# Patient Record
Sex: Female | Born: 1975 | Race: White | Hispanic: No | Marital: Married | Smoking: Former smoker
Health system: Southern US, Community
[De-identification: ages and names within clinical notes are randomized; demographics above are authoritative.]

## PROBLEM LIST (undated history)

## (undated) DIAGNOSIS — R350 Frequency of micturition: Secondary | ICD-10-CM

## (undated) DIAGNOSIS — Z87442 Personal history of urinary calculi: Secondary | ICD-10-CM

## (undated) DIAGNOSIS — C50919 Malignant neoplasm of unspecified site of unspecified female breast: Secondary | ICD-10-CM

## (undated) DIAGNOSIS — N393 Stress incontinence (female) (male): Secondary | ICD-10-CM

## (undated) DIAGNOSIS — Z923 Personal history of irradiation: Secondary | ICD-10-CM

## (undated) DIAGNOSIS — N368 Other specified disorders of urethra: Secondary | ICD-10-CM

## (undated) DIAGNOSIS — K429 Umbilical hernia without obstruction or gangrene: Secondary | ICD-10-CM

## (undated) DIAGNOSIS — I10 Essential (primary) hypertension: Secondary | ICD-10-CM

## (undated) DIAGNOSIS — T8859XA Other complications of anesthesia, initial encounter: Secondary | ICD-10-CM

## (undated) DIAGNOSIS — N6019 Diffuse cystic mastopathy of unspecified breast: Secondary | ICD-10-CM

## (undated) DIAGNOSIS — Z800 Family history of malignant neoplasm of digestive organs: Secondary | ICD-10-CM

## (undated) DIAGNOSIS — Z801 Family history of malignant neoplasm of trachea, bronchus and lung: Secondary | ICD-10-CM

## (undated) HISTORY — DX: Family history of malignant neoplasm of digestive organs: Z80.0

## (undated) HISTORY — DX: Family history of malignant neoplasm of trachea, bronchus and lung: Z80.1

## (undated) HISTORY — DX: Umbilical hernia without obstruction or gangrene: K42.9

## (undated) HISTORY — DX: Diffuse cystic mastopathy of unspecified breast: N60.19

## (undated) HISTORY — DX: Stress incontinence (female) (male): N39.3

---

## 2006-01-20 ENCOUNTER — Emergency Department: Payer: Self-pay | Admitting: Emergency Medicine

## 2006-01-20 ENCOUNTER — Other Ambulatory Visit: Payer: Self-pay

## 2006-02-14 DIAGNOSIS — F410 Panic disorder [episodic paroxysmal anxiety] without agoraphobia: Secondary | ICD-10-CM | POA: Insufficient documentation

## 2007-02-02 DIAGNOSIS — E7800 Pure hypercholesterolemia, unspecified: Secondary | ICD-10-CM | POA: Insufficient documentation

## 2008-06-26 ENCOUNTER — Observation Stay: Payer: Self-pay

## 2008-08-25 ENCOUNTER — Encounter: Payer: Self-pay | Admitting: Maternal & Fetal Medicine

## 2008-09-01 ENCOUNTER — Ambulatory Visit: Payer: Self-pay

## 2008-09-08 ENCOUNTER — Encounter: Payer: Self-pay | Admitting: Maternal and Fetal Medicine

## 2008-09-15 ENCOUNTER — Encounter: Payer: Self-pay | Admitting: Maternal & Fetal Medicine

## 2008-09-22 ENCOUNTER — Encounter: Payer: Self-pay | Admitting: Obstetrics & Gynecology

## 2008-09-25 ENCOUNTER — Observation Stay: Payer: Self-pay

## 2008-10-05 ENCOUNTER — Observation Stay: Payer: Self-pay

## 2008-10-09 ENCOUNTER — Encounter: Payer: Self-pay | Admitting: Maternal & Fetal Medicine

## 2008-10-20 ENCOUNTER — Observation Stay: Payer: Self-pay

## 2008-10-23 ENCOUNTER — Encounter: Payer: Self-pay | Admitting: Obstetrics & Gynecology

## 2008-10-24 ENCOUNTER — Inpatient Hospital Stay: Payer: Self-pay

## 2009-08-25 DIAGNOSIS — E559 Vitamin D deficiency, unspecified: Secondary | ICD-10-CM | POA: Insufficient documentation

## 2010-07-04 HISTORY — PX: FOOT SURGERY: SHX648

## 2011-04-26 ENCOUNTER — Emergency Department: Payer: Self-pay | Admitting: Emergency Medicine

## 2011-04-26 LAB — URINALYSIS, COMPLETE
Ketone: NEGATIVE
Ph: 7 (ref 4.5–8.0)
Protein: 100
RBC,UR: 96 /HPF (ref 0–5)
Specific Gravity: 1.008 (ref 1.003–1.030)

## 2011-04-26 LAB — PREGNANCY, URINE: Pregnancy Test, Urine: NEGATIVE m[IU]/mL

## 2011-12-01 ENCOUNTER — Ambulatory Visit: Payer: Self-pay | Admitting: Family Medicine

## 2012-11-22 LAB — HM PAP SMEAR: HM Pap smear: NORMAL

## 2013-08-08 LAB — HEPATIC FUNCTION PANEL
ALT: 7 U/L (ref 7–35)
AST: 12 U/L — AB (ref 13–35)
Alkaline Phosphatase: 47 U/L (ref 25–125)
BILIRUBIN, TOTAL: 1 mg/dL

## 2013-08-08 LAB — LIPID PANEL
Cholesterol: 166 mg/dL (ref 0–200)
HDL: 56 mg/dL (ref 35–70)
LDL Cholesterol: 88 mg/dL
Triglycerides: 112 mg/dL (ref 40–160)

## 2013-08-08 LAB — CBC AND DIFFERENTIAL
HEMATOCRIT: 42 % (ref 36–46)
Hemoglobin: 13.6 g/dL (ref 12.0–16.0)
Neutrophils Absolute: 63 /uL
Platelets: 401 10*3/uL — AB (ref 150–399)
WBC: 6.5 10*3/mL

## 2013-08-08 LAB — BASIC METABOLIC PANEL
BUN: 14 mg/dL (ref 4–21)
Creatinine: 0.8 mg/dL (ref 0.5–1.1)
GLUCOSE: 92 mg/dL
POTASSIUM: 4.9 mmol/L (ref 3.4–5.3)
Sodium: 140 mmol/L (ref 137–147)

## 2013-08-08 LAB — TSH: TSH: 1.21 u[IU]/mL (ref 0.41–5.90)

## 2015-02-03 ENCOUNTER — Encounter: Payer: Self-pay | Admitting: Family Medicine

## 2015-03-13 DIAGNOSIS — S86019A Strain of unspecified Achilles tendon, initial encounter: Secondary | ICD-10-CM | POA: Insufficient documentation

## 2015-03-13 DIAGNOSIS — R002 Palpitations: Secondary | ICD-10-CM | POA: Insufficient documentation

## 2015-03-13 DIAGNOSIS — Z87442 Personal history of urinary calculi: Secondary | ICD-10-CM | POA: Insufficient documentation

## 2015-03-20 ENCOUNTER — Ambulatory Visit (INDEPENDENT_AMBULATORY_CARE_PROVIDER_SITE_OTHER): Payer: PRIVATE HEALTH INSURANCE | Admitting: Family Medicine

## 2015-03-20 ENCOUNTER — Encounter: Payer: Self-pay | Admitting: Family Medicine

## 2015-03-20 VITALS — BP 102/68 | HR 72 | Temp 98.3°F | Resp 14 | Ht 68.25 in | Wt 139.6 lb

## 2015-03-20 DIAGNOSIS — F410 Panic disorder [episodic paroxysmal anxiety] without agoraphobia: Secondary | ICD-10-CM

## 2015-03-20 DIAGNOSIS — Z0000 Encounter for general adult medical examination without abnormal findings: Secondary | ICD-10-CM

## 2015-03-20 DIAGNOSIS — E7800 Pure hypercholesterolemia, unspecified: Secondary | ICD-10-CM

## 2015-03-20 LAB — POCT URINALYSIS DIPSTICK
BILIRUBIN UA: NEGATIVE
Glucose, UA: NEGATIVE
Ketones, UA: NEGATIVE
Leukocytes, UA: NEGATIVE
Nitrite, UA: NEGATIVE
PH UA: 7.5
Protein, UA: NEGATIVE
RBC UA: NEGATIVE
Spec Grav, UA: 1.01
UROBILINOGEN UA: 0.2

## 2015-03-20 NOTE — Progress Notes (Signed)
Patient ID: [name], female   DOB: Mar 27, 1976, 39 y.o.   MRN: [id number]       Patient: Megan Cummings, Female    DOB: March 11, 1976, 39 y.o.   MRN: [id number] Visit Date: 03/20/2015  Today's Provider: Vernie Murders, PA   Chief Complaint  Patient presents with  . Annual Exam   Subjective:    Annual physical exam Megan Cummings is a 39 y.o. female who presents today for health maintenance and complete physical. She feels well. She reports exercising 2 times a week. She reports she is sleeping well (average 7 hours per night).  Mammo: 12/31/2014 Tdap: 10/28/2010 Pap: 12/2014 at [facility] [place]:[id number] -----------------------------------------------------------------    Review of Systems  Constitutional: Negative.   HENT: Negative.   Eyes: Negative.   Respiratory: Negative.   Cardiovascular: Negative.   Gastrointestinal: Negative.   Endocrine: Negative.   Genitourinary: Negative.   Musculoskeletal: Positive for back pain.  Skin: Negative.   Allergic/Immunologic: Negative.   Neurological: Negative.   Hematological: Negative.   Psychiatric/Behavioral: Negative.     Social History      She  reports that she has quit smoking. Her smoking use included Cigarettes. She has a 5 pack-year smoking history. She has never used smokeless tobacco. She reports that she drinks alcohol. She reports that she does not use illicit drugs.       Social History   Social History  . Marital Status: Married    Spouse Name: N/A  . Number of Children: N/A  . Years of Education: N/A   Social History Main Topics  . Smoking status: Former Smoker -- 0.50 packs/day for 10 years    Types: Cigarettes  . Smokeless tobacco: Never Used  . Alcohol Use: 0.0 oz/week    0 Standard drinks or equivalent per week     Comment: Occasionally drinks wine  . Drug Use: No  . Sexual Activity: Not Asked   Other Topics Concern  . None   Social History Narrative    Patient Active Problem  List   Diagnosis Date Noted  . Personal history of urinary calculi 03/13/2015  . Awareness of heartbeats 03/13/2015  . Achilles rupture 03/13/2015  . Avitaminosis D 08/25/2009  . Pure hypercholesterolemia 02/02/2007  . Episodic paroxysmal anxiety disorder 02/14/2006   Past Surgical History  Procedure Laterality Date  . Foot surgery Right 07/2010   Family History        Family Status  Relation Status Death Age  . Mother Alive   . Father Alive   . Brother Alive   . Son Alive   . Maternal Grandmother Alive   . Maternal Grandfather Deceased     suicide  . Paternal Grandfather Deceased         Her family history includes Colon cancer in her paternal grandmother; Emphysema in her maternal grandfather; Healthy in her brother, maternal grandmother, mother, and son; Hyperlipidemia in her father; Lung cancer in her paternal grandfather.    Allergies  Allergen Reactions  . Sulfa Antibiotics     Previous Medications   LORAZEPAM (ATIVAN) 1 MG TABLET    Take by mouth.    Patient Care Team: Margo Common, PA as PCP - General (Family Medicine)     Objective:   Vitals: BP 102/68 mmHg  Pulse 72  Temp(Src) 98.3 F (36.8 C) (Oral)  Resp 14  Ht 5' 8.25" (1.734 m)  Wt 139 lb 9.6 oz (63.322 kg)  BMI 21.06 kg/m2  SpO2  98%  LMP 03/06/2015   Physical Exam  Constitutional: She is oriented to person, place, and time. She appears well-developed and well-nourished.  HENT:  Head: Normocephalic and atraumatic.  Right Ear: External ear normal.  Left Ear: External ear normal.  Nose: Nose normal.  Mouth/Throat: Oropharynx is clear and moist.  Eyes: Conjunctivae and EOM are normal. Pupils are equal, round, and reactive to light. Right eye exhibits no discharge.  Neck: Normal range of motion. Neck supple. No tracheal deviation present. No thyromegaly present.  Cardiovascular: Normal rate, regular rhythm, normal heart sounds and intact distal pulses.   No murmur heard. Pulmonary/Chest:  Effort normal and breath sounds normal. No respiratory distress. She has no wheezes. She has no rales. She exhibits no tenderness.  Abdominal: Soft. She exhibits no distension and no mass. There is no tenderness. There is no rebound and no guarding.  Genitourinary:  Accomplished by Mosetta Pigeon in September 2016 with mammograms and PAP.  Musculoskeletal: Normal range of motion. She exhibits no edema or tenderness.  Lymphadenopathy:    She has no cervical adenopathy.  Neurological: She is alert and oriented to person, place, and time. She has normal reflexes. No cranial nerve deficit. She exhibits normal muscle tone. Coordination normal.  Skin: Skin is warm and dry. No rash noted. No erythema.  Psychiatric: She has a normal mood and affect. Her behavior is normal. Judgment and thought content normal.   Depression Screen Rare anxiety/panic attacks. Uses Ativan 4-5 times a year. No suicidal ideation or depression.   Assessment & Plan:     Routine Health Maintenance and Physical Exam  Exercise Activities and Dietary recommendations Goals    Continue routine yoga and exercise program twice a week with well balanced diet.      Immunization History  Administered Date(s) Administered  . Tdap 10/28/2010    Health Maintenance  Topic Date Due  . HIV Screening  02/07/1989  . INFLUENZA VACCINE  07/03/2015 (Originally 11/03/2014)  . PAP SMEAR  12/30/2017  . TETANUS/TDAP  10/27/2020      Discussed health benefits of physical activity, and encouraged her to engage in regular exercise appropriate for her age and condition.    --------------------------------------------------------------------  1. Annual physical exam Good general health. Continue routine exercise program. Refuses flu shot. Remainder of immunizations up to date. Continues to get PAP and mammograms by her GYN ([facility]) annually. Recheck routine labs and follow up prn. - POCT urinalysis dipstick - CBC with  Differential/Platelet - COMPLETE METABOLIC PANEL WITH GFR - HIV antibody (with reflex)  2. Episodic paroxysmal anxiety disorder Rare anxiety attacks with palpitations. Well controlled by Ativan prn. Has taken 4-5 tablets in the past year. Recheck prn. - TSH  3. Pure hypercholesterolemia Trying to control diet and exercise regularly. Will recheck labs. - Lipid panel

## 2015-03-20 NOTE — Patient Instructions (Signed)

## 2015-09-21 DIAGNOSIS — C4491 Basal cell carcinoma of skin, unspecified: Secondary | ICD-10-CM

## 2015-09-21 HISTORY — DX: Basal cell carcinoma of skin, unspecified: C44.91

## 2015-10-02 ENCOUNTER — Encounter: Payer: Self-pay | Admitting: Family Medicine

## 2015-10-02 ENCOUNTER — Ambulatory Visit (INDEPENDENT_AMBULATORY_CARE_PROVIDER_SITE_OTHER): Payer: Managed Care, Other (non HMO) | Admitting: Family Medicine

## 2015-10-02 VITALS — BP 118/72 | Temp 99.2°F | Resp 16 | Ht 69.0 in | Wt 145.0 lb

## 2015-10-02 DIAGNOSIS — K429 Umbilical hernia without obstruction or gangrene: Secondary | ICD-10-CM

## 2015-10-02 DIAGNOSIS — R202 Paresthesia of skin: Secondary | ICD-10-CM | POA: Diagnosis not present

## 2015-10-02 NOTE — Progress Notes (Signed)
Patient: Megan Cummings Female    DOB: 1976-03-31   40 y.o.   MRN: [id number] Visit Date: 10/02/2015  Today's Provider: Vernie Murders, PA   Chief Complaint  Patient presents with  . Abdominal Pain    X 4 days.    Subjective:    Abdominal Pain This is a new problem. The current episode started in the past 7 days. The problem occurs constantly. The pain is mild. The quality of the pain is dull. The abdominal pain radiates to the periumbilical region. Pertinent negatives include no constipation, diarrhea, dysuria, nausea or vomiting. The pain is aggravated by certain positions. She has tried acetaminophen for the symptoms. The treatment provided no relief.   No past medical history on file. Patient Active Problem List   Diagnosis Date Noted  . Personal history of urinary calculi 03/13/2015  . Awareness of heartbeats 03/13/2015  . Achilles rupture 03/13/2015  . Avitaminosis D 08/25/2009  . Pure hypercholesterolemia 02/02/2007  . Episodic paroxysmal anxiety disorder 02/14/2006   Past Surgical History  Procedure Laterality Date  . Foot surgery Right 07/2010   Family History  Problem Relation Age of Onset  . Healthy Mother   . Hyperlipidemia Father   . Healthy Brother   . Healthy Son   . Healthy Maternal Grandmother   . Emphysema Maternal Grandfather   . Colon cancer Paternal Grandmother   . Lung cancer Paternal Grandfather     Allergies  Allergen Reactions  . Sulfa Antibiotics    Current Meds  Medication Sig  . LORazepam (ATIVAN) 1 MG tablet Take by mouth.    Review of Systems  Constitutional: Negative.   Gastrointestinal: Positive for abdominal pain. Negative for nausea, vomiting, diarrhea, constipation and blood in stool.  Genitourinary: Negative for dysuria.    Social History  Substance Use Topics  . Smoking status: Former Smoker -- 0.50 packs/day for 10 years    Types: Cigarettes  . Smokeless tobacco: Never Used  . Alcohol Use: 0.0 oz/week    0  Standard drinks or equivalent per week     Comment: Occasionally drinks wine   Objective:   BP 118/72 mmHg  Temp(Src) 99.2 F (37.3 C)  Resp 16  Ht 5\' 9"  (1.753 m)  Wt 145 lb (65.772 kg)  BMI 21.40 kg/m2  LMP 09/07/2015 (Approximate)  Physical Exam  Constitutional: She is oriented to person, place, and time. She appears well-developed and well-nourished. No distress.  HENT:  Head: Normocephalic and atraumatic.  Right Ear: Hearing normal.  Left Ear: Hearing normal.  Nose: Nose normal.  Eyes: Conjunctivae and lids are normal. Right eye exhibits no discharge. Left eye exhibits no discharge. No scleral icterus.  Neck: Neck supple.  Cardiovascular: Normal rate and regular rhythm.   Pulmonary/Chest: Effort normal and breath sounds normal. No respiratory distress.  Abdominal: Soft. Bowel sounds are normal. There is no tenderness.  1 cm opening in abdominal muscles behind umbilicus. No tenderness or protruding hernia.  Musculoskeletal: Normal range of motion.  Neurological: She is alert and oriented to person, place, and time.  Good bilateral grip strength and arm strength. Decrease in sensation of radial side of left thumb up the forearm along the radius to the medial upper arm to test with a nylon string (approximate 3 cm path). Negative Phalen and Tinel signs. Symmetric pulses.  Skin: Skin is intact. No lesion and no rash noted.  Psychiatric: She has a normal mood and affect. Her speech is normal and  behavior is normal. Thought content normal.      Assessment & Plan:     1. Umbilical hernia without obstruction and without gangrene Recent onset with intermittent sharp pain with certain movements or exercise. No swelling or signs of incarceration. Suspect strain/stretch of umbilical defect. Limit lifting and straining. Use Ibuprofen 600 mg TID with meals and recheck if no better or worsening in the next 2 weeks. May need referral to surgeon.  2. Paresthesia of left arm Onset over the  past 5-7 days without known injury or chronic repetitive motion activities. No muscle weakness or neck pain. If no better with rest and use of NSAID over the next 2 weeks, will need referral to a neurologist for NCV studies.       Vernie Murders, PA  [facility]

## 2015-10-02 NOTE — Patient Instructions (Signed)
Hernia, Adult A hernia is the bulging of an organ or tissue through a weak spot in the muscles of the abdomen (abdominal wall). Hernias develop most often near the navel or groin. There are many kinds of hernias. Common kinds include:  Femoral hernia. This kind of hernia develops under the groin in the upper thigh area.  Inguinal hernia. This kind of hernia develops in the groin or scrotum.  Umbilical hernia. This kind of hernia develops near the navel.  Hiatal hernia. This kind of hernia causes part of the stomach to be pushed up into the chest.  Incisional hernia. This kind of hernia bulges through a scar from an abdominal surgery. CAUSES This condition may be caused by:  Heavy lifting.  Coughing over a long period of time.  Straining to have a bowel movement.  An incision made during an abdominal surgery.  A birth defect (congenital defect).  Excess weight or obesity.  Smoking.  Poor nutrition.  Cystic fibrosis.  Excess fluid in the abdomen.  Undescended testicles. SYMPTOMS Symptoms of a hernia include:  A lump on the abdomen. This is the first sign of a hernia. The lump may become more obvious with standing, straining, or coughing. It may get bigger over time if it is not treated or if the condition causing it is not treated.  Pain. A hernia is usually painless, but it may become painful over time if treatment is delayed. The pain is usually dull and may get worse with standing or lifting heavy objects. Sometimes a hernia gets tightly squeezed in the weak spot (strangulated) or stuck there (incarcerated) and causes additional symptoms. These symptoms may include:  Vomiting.  Nausea.  Constipation.  Irritability. DIAGNOSIS A hernia may be diagnosed with:  A physical exam. During the exam your health care provider may ask you to cough or to make a specific movement, because a hernia is usually more visible when you move.  Imaging tests. These can  include:  X-rays.  Ultrasound.  CT scan. TREATMENT A hernia that is small and painless may not need to be treated. A hernia that is large or painful may be treated with surgery. Inguinal hernias may be treated with surgery to prevent incarceration or strangulation. Strangulated hernias are always treated with surgery, because lack of blood to the trapped organ or tissue can cause it to die. Surgery to treat a hernia involves pushing the bulge back into place and repairing the weak part of the abdomen. HOME CARE INSTRUCTIONS  Avoid straining.  Do not lift anything heavier than 10 lb (4.5 kg).  Lift with your leg muscles, not your back muscles. This helps avoid strain.  When coughing, try to cough gently.  Prevent constipation. Constipation leads to straining with bowel movements, which can make a hernia worse or cause a hernia repair to break down. You can prevent constipation by:  Eating a high-fiber diet that includes plenty of fruits and vegetables.  Drinking enough fluids to keep your urine clear or pale yellow. Aim to drink 6-8 glasses of water per day.  Using a stool softener as directed by your health care provider.  Lose weight, if you are overweight.  Do not use any tobacco products, including cigarettes, chewing tobacco, or electronic cigarettes. If you need help quitting, ask your health care provider.  Keep all follow-up visits as directed by your health care provider. This is important. Your health care provider may need to monitor your condition. SEEK MEDICAL CARE IF:  You have   swelling, redness, and pain in the affected area.  Your bowel habits change. SEEK IMMEDIATE MEDICAL CARE IF:  You have a fever.  You have abdominal pain that is getting worse.  You feel nauseous or you vomit.  You cannot push the hernia back in place by gently pressing on it while you are lying down.  The hernia:  Changes in shape or size.  Is stuck outside the  abdomen.  Becomes discolored.  Feels hard or tender.   This information is not intended to replace advice given to you by your health care provider. Make sure you discuss any questions you have with your health care provider.   Document Released: 03/21/2005 Document Revised: 04/11/2014 Document Reviewed: 01/29/2014 Elsevier Interactive Patient Education 2016 Elsevier Inc[employer]  

## 2015-10-03 DIAGNOSIS — K429 Umbilical hernia without obstruction or gangrene: Secondary | ICD-10-CM

## 2015-10-03 HISTORY — DX: Umbilical hernia without obstruction or gangrene: K42.9

## 2015-10-12 ENCOUNTER — Encounter: Payer: Self-pay | Admitting: Family Medicine

## 2015-10-12 ENCOUNTER — Encounter: Payer: Self-pay | Admitting: *Deleted

## 2015-10-12 ENCOUNTER — Ambulatory Visit (INDEPENDENT_AMBULATORY_CARE_PROVIDER_SITE_OTHER): Payer: Managed Care, Other (non HMO) | Admitting: Family Medicine

## 2015-10-12 VITALS — BP 124/68 | HR 58 | Temp 98.6°F | Resp 16 | Ht 69.0 in | Wt 145.0 lb

## 2015-10-12 DIAGNOSIS — R1033 Periumbilical pain: Secondary | ICD-10-CM

## 2015-10-12 NOTE — Progress Notes (Signed)
Patient: Megan Cummings Female    DOB: [birth year]   40 y.o.   MRN: [id number] Visit Date: 10/12/2015  Today's Provider: Vernie Murders, PA   Chief Complaint  Patient presents with  . Abdominal Pain   Subjective:    HPI Patient comes in today c/o abdominal pain that is located around her periumbilical region. Patient was seen in the office on 10/02/2015 with similar symptoms. Patient reports that during the exam, she could not feel the pain when her stomach was pressed in a downward motion. Patient reports that she experiences the pain when her stomach is pressed in an upward motion. Patient is not sure if this has any significance, but wants to be checked out to be sure. She has been taking Ibuprofen with no relief.   No past medical history on file. Patient Active Problem List   Diagnosis Date Noted  . Personal history of urinary calculi 03/13/2015  . Awareness of heartbeats 03/13/2015  . Achilles rupture 03/13/2015  . Avitaminosis D 08/25/2009  . Pure hypercholesterolemia 02/02/2007  . Episodic paroxysmal anxiety disorder 02/14/2006   Past Surgical History  Procedure Laterality Date  . Foot surgery Right 07/2010   Family History  Problem Relation Age of Onset  . Healthy Mother   . Hyperlipidemia Father   . Healthy Brother   . Healthy Son   . Healthy Maternal Grandmother   . Emphysema Maternal Grandfather   . Colon cancer Paternal Grandmother   . Lung cancer Paternal Grandfather    Allergies  Allergen Reactions  . Sulfa Antibiotics    Current Meds  Medication Sig  . LORazepam (ATIVAN) 1 MG tablet Take by mouth.    Review of Systems  Constitutional: Negative.   Gastrointestinal: Positive for abdominal pain. Negative for nausea, vomiting, diarrhea, constipation and abdominal distention.  Genitourinary: Negative.   Neurological: Negative.     Social History  Substance Use Topics  . Smoking status: Former Smoker -- 0.50 packs/day for 10 years   Types: Cigarettes  . Smokeless tobacco: Never Used  . Alcohol Use: 0.0 oz/week    0 Standard drinks or equivalent per week     Comment: Occasionally drinks wine   Objective:   BP 124/68 mmHg  Pulse 58  Temp(Src) 98.6 F (37 C)  Resp 16  Ht 5\' 9"  (1.753 m)  Wt 145 lb (65.772 kg)  BMI 21.40 kg/m2  LMP 10/08/2015  Physical Exam  Constitutional: She is oriented to person, place, and time. She appears well-developed and well-nourished. No distress.  HENT:  Head: Normocephalic and atraumatic.  Right Ear: Hearing normal.  Left Ear: Hearing normal.  Nose: Nose normal.  Eyes: Conjunctivae and lids are normal. Right eye exhibits no discharge. Left eye exhibits no discharge. No scleral icterus.  Pulmonary/Chest: Effort normal. No respiratory distress.  Abdominal: Soft. Bowel sounds are normal. She exhibits no distension and no mass. There is tenderness. There is no rebound and no guarding.  Pain 2-3 cm left of the umbilicus with a lifting palpation/pushing upward.  Musculoskeletal: Normal range of motion.  Neurological: She is alert and oriented to person, place, and time.  Skin: Skin is intact. No lesion and no rash noted.  Psychiatric: She has a normal mood and affect. Her speech is normal and behavior is normal. Thought content normal.      Assessment & Plan:     1. Periumbilical pain Some worsening of periumbilical pain to the left of the umbilicus. No masses  felt. Slight opening behind umbilicus palpable. Will schedule surgical evaluation. May be due to return to strenuous exercise program. - Ambulatory referral to [place] [facility]

## 2015-10-27 ENCOUNTER — Encounter: Payer: Self-pay | Admitting: General Surgery

## 2015-10-27 ENCOUNTER — Ambulatory Visit (INDEPENDENT_AMBULATORY_CARE_PROVIDER_SITE_OTHER): Payer: Managed Care, Other (non HMO) | Admitting: General Surgery

## 2015-10-27 VITALS — BP 128/70 | HR 72 | Resp 12 | Ht 69.0 in | Wt 143.0 lb

## 2015-10-27 DIAGNOSIS — K429 Umbilical hernia without obstruction or gangrene: Secondary | ICD-10-CM | POA: Insufficient documentation

## 2015-10-27 NOTE — Patient Instructions (Addendum)
Hernia, Adult A hernia is the bulging of an organ or tissue through a weak spot in the muscles of the abdomen (abdominal wall). Hernias develop most often near the navel or groin. There are many kinds of hernias. Common kinds include:  Femoral hernia. This kind of hernia develops under the groin in the upper thigh area.  Inguinal hernia. This kind of hernia develops in the groin or scrotum.  Umbilical hernia. This kind of hernia develops near the navel.  Hiatal hernia. This kind of hernia causes part of the stomach to be pushed up into the chest.  Incisional hernia. This kind of hernia bulges through a scar from an abdominal surgery. CAUSES This condition may be caused by:  Heavy lifting.  Coughing over a long period of time.  Straining to have a bowel movement.  An incision made during an abdominal surgery.  A birth defect (congenital defect).  Excess weight or obesity.  Smoking.  Poor nutrition.  Cystic fibrosis.  Excess fluid in the abdomen.  Undescended testicles. SYMPTOMS Symptoms of a hernia include:  A lump on the abdomen. This is the first sign of a hernia. The lump may become more obvious with standing, straining, or coughing. It may get bigger over time if it is not treated or if the condition causing it is not treated.  Pain. A hernia is usually painless, but it may become painful over time if treatment is delayed. The pain is usually dull and may get worse with standing or lifting heavy objects. Sometimes a hernia gets tightly squeezed in the weak spot (strangulated) or stuck there (incarcerated) and causes additional symptoms. These symptoms may include:  Vomiting.  Nausea.  Constipation.  Irritability. DIAGNOSIS A hernia may be diagnosed with:  A physical exam. During the exam your health care provider may ask you to cough or to make a specific movement, because a hernia is usually more visible when you move.  Imaging tests. These can  include:  X-rays.  Ultrasound.  CT scan. TREATMENT A hernia that is small and painless may not need to be treated. A hernia that is large or painful may be treated with surgery. Inguinal hernias may be treated with surgery to prevent incarceration or strangulation. Strangulated hernias are always treated with surgery, because lack of blood to the trapped organ or tissue can cause it to die. Surgery to treat a hernia involves pushing the bulge back into place and repairing the weak part of the abdomen. HOME CARE INSTRUCTIONS  Avoid straining.  Do not lift anything heavier than 10 lb (4.5 kg).  Lift with your leg muscles, not your back muscles. This helps avoid strain.  When coughing, try to cough gently.  Prevent constipation. Constipation leads to straining with bowel movements, which can make a hernia worse or cause a hernia repair to break down. You can prevent constipation by:  Eating a high-fiber diet that includes plenty of fruits and vegetables.  Drinking enough fluids to keep your urine clear or pale yellow. Aim to drink 6-8 glasses of water per day.  Using a stool softener as directed by your health care provider.  Lose weight, if you are overweight.  Do not use any tobacco products, including cigarettes, chewing tobacco, or electronic cigarettes. If you need help quitting, ask your health care provider.  Keep all follow-up visits as directed by your health care provider. This is important. Your health care provider may need to monitor your condition. SEEK MEDICAL CARE IF:  You have   swelling, redness, and pain in the affected area.  Your bowel habits change. SEEK IMMEDIATE MEDICAL CARE IF:  You have a fever.  You have abdominal pain that is getting worse.  You feel nauseous or you vomit.  You cannot push the hernia back in place by gently pressing on it while you are lying down.  The hernia:  Changes in shape or size.  Is stuck outside the  abdomen.  Becomes discolored.  Feels hard or tender.   This information is not intended to replace advice given to you by your health care provider. Make sure you discuss any questions you have with your health care provider.   Document Released: 03/21/2005 Document Revised: 04/11/2014 Document Reviewed: 01/29/2014 Elsevier Interactive Patient Education [employer].     The patient is aware to use an antiinflammatory of choice (Advil or Aleve) as needed for comfort.The patient is aware to use a heating pad.  Patient to return as needed.

## 2015-10-27 NOTE — Progress Notes (Signed)
Patient ID: Megan Cummings, female   DOB: 1975/05/21, 40 y.o.   MRN: [id number]  Chief Complaint  Patient presents with  . Umbilical Hernia    HPI [name] is a 40 y.o. female here today for a evaluation of a umbilical hernia. Patient states she noticed this about 3 weeks ago. The pain is mild with touch and feels like a dull ache.. The abdominal pain radiates to the periumbilical region.No  constipation, diarrhea, dysuria, nausea or vomiting. Patient has been working out .This exercise regimen is fairly vigorous including the use of free weights.  I personally reviewed the patient's history.        HPI  Past Medical History:  Diagnosis Date  . Umbilical hernia 0000000    Past Surgical History:  Procedure Laterality Date  . FOOT SURGERY Right 07/2010    Family History  Problem Relation Age of Onset  . Healthy Mother   . Hyperlipidemia Father   . Healthy Brother   . Healthy Son   . Healthy Maternal Grandmother   . Emphysema Maternal Grandfather   . Lung cancer Paternal Grandfather   . Colon cancer Paternal Grandmother     Social History Social History  Substance Use Topics  . Smoking status: Former Smoker    Packs/day: 0.50    Years: 10.00    Types: Cigarettes  . Smokeless tobacco: Never Used  . Alcohol use 0.0 oz/week     Comment: Occasionally drinks wine    Allergies  Allergen Reactions  . Sulfa Antibiotics     Current Outpatient Prescriptions  Medication Sig Dispense Refill  . LORazepam (ATIVAN) 1 MG tablet Take by mouth.     No current facility-administered medications for this visit.     Review of Systems Review of Systems  Constitutional: Negative.   Respiratory: Negative.   Cardiovascular: Negative.     Blood pressure 128/70, pulse 72, resp. rate 12, height 5\' 9"  (1.753 m), weight 143 lb (64.9 kg), last menstrual period 10/08/2015.  Physical Exam Physical Exam  Constitutional: She is oriented to person, place, and time. She  appears well-developed and well-nourished.  Eyes: Conjunctivae are normal. No scleral icterus.  Neck: Neck supple.  Cardiovascular: Normal rate, regular rhythm and normal heart sounds.   Pulmonary/Chest: Effort normal and breath sounds normal.  Abdominal: Soft. Normal appearance and bowel sounds are normal. There is tenderness (2 cm from the umbilicus). A hernia ( 10 mm defect at umbilical area. ) is present.    Lymphadenopathy:    She has no cervical adenopathy.    She has no axillary adenopathy.  Neurological: She is alert and oriented to person, place, and time.  Skin: Skin is warm and dry.       Assessment    Umbilical hernia, asymptomatic.  Left rectus muscle strain.    Plan        The patient is aware to use an antiinflammatory of choice (Advil or Aleve) as needed for comfort.The patient is aware to use a heating pad.  Patient to return as needed.  This information has been scribed by Gaspar Cola CMA.  PCP:  PA Vernie Murders  Robert Bellow 10/27/2015, 4:36 PM

## 2016-10-17 ENCOUNTER — Ambulatory Visit (INDEPENDENT_AMBULATORY_CARE_PROVIDER_SITE_OTHER): Payer: BLUE CROSS/BLUE SHIELD | Admitting: Family Medicine

## 2016-10-17 ENCOUNTER — Encounter: Payer: Self-pay | Admitting: Family Medicine

## 2016-10-17 VITALS — BP 108/76 | HR 72 | Temp 98.1°F | Wt 147.6 lb

## 2016-10-17 DIAGNOSIS — F419 Anxiety disorder, unspecified: Secondary | ICD-10-CM | POA: Diagnosis not present

## 2016-10-17 DIAGNOSIS — L719 Rosacea, unspecified: Secondary | ICD-10-CM

## 2016-10-17 MED ORDER — BENZOYL PEROXIDE-ERYTHROMYCIN 5-3 % EX GEL: Freq: Two times a day (BID) | CUTANEOUS | 0 refills | Status: DC

## 2016-10-17 MED ORDER — LORAZEPAM 1 MG PO TABS: 1.0000 mg | ORAL_TABLET | Freq: Three times a day (TID) | ORAL | 0 refills | Status: DC | PRN

## 2016-10-17 NOTE — Patient Instructions (Signed)
Rosacea Rosacea is a long-term (chronic) condition that affects the skin of the face, including the cheeks, nose, brow, and chin. This condition can also affect the eyes. Rosacea causes blood vessels near the surface of the skin to enlarge, which results in redness. What are the causes? The cause of this condition is not known. Certain triggers can make rosacea worse, including:  Hot baths.  Exercise.  Sunlight.  Very hot or cold temperatures.  Hot or spicy foods and drinks.  Drinking alcohol.  Stress.  Taking blood pressure medicine.  Long-term use of topical steroids on the face.  What increases the risk? This condition is more likely to develop in:  People who are older than 43 years of age.  Women.  People who have light-colored skin (light complexion).  People who have a family history of rosacea.  What are the signs or symptoms? Symptoms of this condition include:  Redness of the face.  Red bumps or pimples on the face.  A red, enlarged nose.  Blushing easily.  Red lines on the skin.  Irritated or burning feeling in the eyes.  Swollen eyelids.  How is this diagnosed? This condition is diagnosed with a medical history and physical exam. How is this treated? There is no cure for this condition, but treatment can help to control your symptoms. Your health care provider may recommend that you see a skin specialist (dermatologist). Treatment may include:  Antibiotic medicines that are applied to the skin or taken as a pill.  Laser treatment to improve the appearance of the skin.  Surgery. This is rare.  Your health care provider will also recommend the best way to take care of your skin. Even after your skin improves, you will likely need to continue treatment to prevent your rosacea from coming back. Follow these instructions at home: Skin Care Take care of your skin as told by your health care provider. You may be told to do these things:  Wash  your skin gently two or more times each day.  Use mild soap.  Use a sunscreen or sunblock with SPF 30 or greater.  Use gentle cosmetics that are meant for sensitive skin.  Shave with an electric shaver instead of a blade.  Lifestyle  Try to keep track of what foods trigger this condition. Avoid any triggers. These may include: ? Spicy foods. ? Seafood. ? Cheese. ? Hot liquids. ? Nuts. ? Chocolate. ? Iodized salt.  Do not drink alcohol.  Avoid extremely cold or hot temperatures.  Try to reduce your stress. If you need help, talk with your health care provider.  When you exercise, do these things to stay cool: ? Limit your sun exposure. ? Use a fan. ? Do shorter and more frequent intervals of exercise. General instructions  Keep all follow-up visits as told by your health care provider. This is important.  Take over-the-counter and prescription medicines only as told by your health care provider.  If your eyelids are affected, apply warm compresses to them. Do this as told by your health care provider.  If you were prescribed an antibiotic medicine, apply or take it as told by your health care provider. Do not stop using the antibiotic even if your condition improves. Contact a health care provider if:  Your symptoms get worse.  Your symptoms do not improve after two months of treatment.  You have new symptoms.  You have any changes in vision or you have problems with your eyes, such as   redness or itching.  You feel depressed.  You lose your appetite.  You have trouble concentrating. This information is not intended to replace advice given to you by your health care provider. Make sure you discuss any questions you have with your health care provider. Document Released: 04/28/2004 Document Revised: 08/27/2015 Document Reviewed: 05/28/2014 Elsevier Interactive Patient Education  2018 Elsevier Inc.  

## 2016-10-17 NOTE — Progress Notes (Signed)
Patient: Megan Cummings Female    DOB: 19-Sep-1975   41 y.o.   MRN: [id number] Visit Date: 10/17/2016  Today's Provider: Vernie Murders, PA   Chief Complaint  Patient presents with  . Facial Swelling   Subjective:    HPI Facial Swelling & Redness This is a recurrent problem. The current episode started yesterday. The problem occurs constantly. The problem has been gradually worsening. Associated symptoms comments: Facial swelling, redness and itching  . Nothing aggravates the symptoms. She has tried nothing for the symptoms. She states she can normally use antibacterial soap and it clears up.    Past Medical History:  Diagnosis Date  . Umbilical hernia 01/2584   Patient Active Problem List   Diagnosis Date Noted  . Umbilical hernia 27/78/2423  . Personal history of urinary calculi 03/13/2015  . Awareness of heartbeats 03/13/2015  . Achilles rupture 03/13/2015  . Avitaminosis D 08/25/2009  . Pure hypercholesterolemia 02/02/2007  . Episodic paroxysmal anxiety disorder 02/14/2006   Past Surgical History:  Procedure Laterality Date  . FOOT SURGERY Right 07/2010   Family History  Problem Relation Age of Onset  . Healthy Mother   . Hyperlipidemia Father   . Healthy Brother   . Healthy Son   . Healthy Maternal Grandmother   . Emphysema Maternal Grandfather   . Lung cancer Paternal Grandfather   . Colon cancer Paternal Grandmother    Allergies  Allergen Reactions  . Sulfa Antibiotics    Previous Medications   LORAZEPAM (ATIVAN) 1 MG TABLET    Take by mouth.    Review of Systems  Constitutional: Negative.   Respiratory: Negative.   Cardiovascular: Negative.   Skin:       Facial swelling and redness     Social History  Substance Use Topics  . Smoking status: Former Smoker    Packs/day: 0.50    Years: 10.00    Types: Cigarettes  . Smokeless tobacco: Never Used  . Alcohol use 0.0 oz/week     Comment: Occasionally drinks wine   Objective:   BP 108/76 (BP  Location: Right Arm, Patient Position: Sitting, Cuff Size: Normal)   Pulse 72   Temp 98.1 F (36.7 C) (Oral)   Wt 147 lb 9.6 oz (67 kg)   SpO2 99%   BMI 21.80 kg/m   Physical Exam  Constitutional: She is oriented to person, place, and time. She appears well-developed and well-nourished. No distress.  HENT:  Head: Normocephalic and atraumatic.  Right Ear: Hearing normal.  Left Ear: Hearing normal.  Nose: Nose normal.  Eyes: Conjunctivae and lids are normal. Right eye exhibits no discharge. Left eye exhibits no discharge. No scleral icterus.  Pulmonary/Chest: Effort normal. No respiratory distress.  Musculoskeletal: Normal range of motion.  Neurological: She is alert and oriented to person, place, and time.  Skin: Skin is intact. Rash noted. No lesion noted.  Fine papules with pimples along chin.  Psychiatric: She has a normal mood and affect. Her speech is normal and behavior is normal. Thought content normal.     Assessment & Plan:     1. Rosacea, acne Intermittent flares of redness and pimple formation. Will treat with Benzmycin gel with the recent flare and encouraged to use antibacterial soaps and sunscreens. Recheck prn. May need referral to dermatologist if no responding well. - benzoyl peroxide-erythromycin (BENZAMYCIN) gel; Apply topically 2 (two) times daily.  Dispense: 23.3 g; Refill: 0  2. Anxiety Planning a trip to [place] in a few weeks  and need refill of Ativan for the trip. Some flying phobia. - LORazepam (ATIVAN) 1 MG tablet; Take 1 tablet (1 mg total) by mouth every 8 (eight) hours as needed for anxiety.  Dispense: 30 tablet; Refill: 0

## 2016-11-08 DIAGNOSIS — E782 Mixed hyperlipidemia: Secondary | ICD-10-CM | POA: Diagnosis not present

## 2016-11-08 DIAGNOSIS — S61209D Unspecified open wound of unspecified finger without damage to nail, subsequent encounter: Secondary | ICD-10-CM | POA: Diagnosis not present

## 2016-12-06 DIAGNOSIS — Z9889 Other specified postprocedural states: Secondary | ICD-10-CM

## 2016-12-06 DIAGNOSIS — Z85828 Personal history of other malignant neoplasm of skin: Secondary | ICD-10-CM | POA: Diagnosis not present

## 2016-12-06 DIAGNOSIS — D229 Melanocytic nevi, unspecified: Secondary | ICD-10-CM | POA: Diagnosis not present

## 2016-12-06 DIAGNOSIS — D485 Neoplasm of uncertain behavior of skin: Secondary | ICD-10-CM | POA: Diagnosis not present

## 2016-12-06 DIAGNOSIS — L814 Other melanin hyperpigmentation: Secondary | ICD-10-CM | POA: Diagnosis not present

## 2016-12-06 DIAGNOSIS — C44612 Basal cell carcinoma of skin of right upper limb, including shoulder: Secondary | ICD-10-CM | POA: Diagnosis not present

## 2016-12-06 DIAGNOSIS — D225 Melanocytic nevi of trunk: Secondary | ICD-10-CM | POA: Diagnosis not present

## 2016-12-06 DIAGNOSIS — Z1283 Encounter for screening for malignant neoplasm of skin: Secondary | ICD-10-CM | POA: Diagnosis not present

## 2016-12-06 DIAGNOSIS — D239 Other benign neoplasm of skin, unspecified: Secondary | ICD-10-CM

## 2016-12-06 HISTORY — DX: Other specified postprocedural states: Z98.890

## 2016-12-06 HISTORY — DX: Other benign neoplasm of skin, unspecified: D23.9

## 2017-02-13 DIAGNOSIS — D485 Neoplasm of uncertain behavior of skin: Secondary | ICD-10-CM | POA: Diagnosis not present

## 2017-02-13 DIAGNOSIS — L089 Local infection of the skin and subcutaneous tissue, unspecified: Secondary | ICD-10-CM | POA: Diagnosis not present

## 2017-02-20 DIAGNOSIS — C44612 Basal cell carcinoma of skin of right upper limb, including shoulder: Secondary | ICD-10-CM | POA: Diagnosis not present

## 2017-02-20 DIAGNOSIS — D485 Neoplasm of uncertain behavior of skin: Secondary | ICD-10-CM | POA: Diagnosis not present

## 2017-02-20 DIAGNOSIS — D229 Melanocytic nevi, unspecified: Secondary | ICD-10-CM | POA: Diagnosis not present

## 2017-06-29 ENCOUNTER — Encounter: Payer: Self-pay | Admitting: Obstetrics and Gynecology

## 2017-06-29 ENCOUNTER — Ambulatory Visit (INDEPENDENT_AMBULATORY_CARE_PROVIDER_SITE_OTHER): Payer: BLUE CROSS/BLUE SHIELD | Admitting: Obstetrics and Gynecology

## 2017-06-29 VITALS — BP 120/70 | Ht 69.0 in | Wt 148.0 lb

## 2017-06-29 DIAGNOSIS — Z0000 Encounter for general adult medical examination without abnormal findings: Secondary | ICD-10-CM | POA: Diagnosis not present

## 2017-06-29 DIAGNOSIS — N6313 Unspecified lump in the right breast, lower outer quadrant: Secondary | ICD-10-CM

## 2017-06-29 DIAGNOSIS — R319 Hematuria, unspecified: Secondary | ICD-10-CM | POA: Diagnosis not present

## 2017-06-29 DIAGNOSIS — Z124 Encounter for screening for malignant neoplasm of cervix: Secondary | ICD-10-CM | POA: Diagnosis not present

## 2017-06-29 DIAGNOSIS — N6324 Unspecified lump in the left breast, lower inner quadrant: Secondary | ICD-10-CM

## 2017-06-29 DIAGNOSIS — N6320 Unspecified lump in the left breast, unspecified quadrant: Secondary | ICD-10-CM

## 2017-06-29 DIAGNOSIS — N6310 Unspecified lump in the right breast, unspecified quadrant: Secondary | ICD-10-CM | POA: Diagnosis not present

## 2017-06-29 LAB — POCT URINALYSIS DIPSTICK
Bilirubin, UA: NEGATIVE
Blood, UA: NEGATIVE
Glucose, UA: NEGATIVE
Ketones, UA: NEGATIVE
Leukocytes, UA: NEGATIVE
Nitrite, UA: NEGATIVE
Protein, UA: POSITIVE
Spec Grav, UA: 1.02
pH, UA: 6

## 2017-06-29 NOTE — Progress Notes (Signed)
Gynecology Annual Exam  PCP: Margo Common, [place]  Chief Complaint:  Chief Complaint  Patient presents with  . Annual Exam    History of Present Illness: Patient is a 42 y.o. G1P1001 presents for annual exam. The patient as complaints today of breasts lumps and tenderness.   LMP: Patient's last menstrual period was 06/14/2017 (exact date). Average Interval: regular, 28 days Duration of flow: 4 days Heavy Menses: no Clots: no Intermenstrual Bleeding: no Postcoital Bleeding: no Dysmenorrhea: no   The patient is sexually active. She currently uses none for contraception. She denies dyspareunia.  The patient does perform self breast exams.  There is no notable family history of breast or ovarian cancer in her family.  The patient wears seatbelts: yes.   The patient has regular exercise: yes.    The patient denies current symptoms of depression.    Review of Systems: ROS  Past Medical History:  Past Medical History:  Diagnosis Date  . Fibrocystic breast   . Stress incontinence   . Umbilical hernia 94/8546    Past Surgical History:  Past Surgical History:  Procedure Laterality Date  . FOOT SURGERY Right 07/2010    Gynecologic History:  Patient's last menstrual period was 06/14/2017 (exact date). Contraception: none Last Pap: Results were: 2016 normal no abnormalities  Last mammogram: 2016 Results were: BI-RAD I  Obstetric History: G1P1001  Family History:  Family History  Problem Relation Age of Onset  . Healthy Mother   . Hyperlipidemia Father   . Healthy Brother   . Healthy Son   . Healthy Maternal Grandmother   . Emphysema Maternal Grandfather   . Lung cancer Paternal Grandfather   . Colon cancer Paternal Grandmother     Social History:  Social History   Socioeconomic History  . Marital status: Married    Spouse name: Not on file  . Number of children: Not on file  . Years of education: Not on file  . Highest education level: Not on file    Occupational History  . Not on file  Social Needs  . Financial resource strain: Not on file  . Food insecurity:    Worry: Not on file    Inability: Not on file  . Transportation needs:    Medical: Not on file    Non-medical: Not on file  Tobacco Use  . Smoking status: Former Smoker    Packs/day: 0.50    Years: 10.00    Pack years: 5.00    Types: Cigarettes  . Smokeless tobacco: Never Used  Substance and Sexual Activity  . Alcohol use: Never    Alcohol/week: 0.0 oz    Frequency: Never    Comment: Occasionally drinks wine  . Drug use: No  . Sexual activity: Yes  Lifestyle  . Physical activity:    Days per week: Not on file    Minutes per session: Not on file  . Stress: Not on file  Relationships  . Social connections:    Talks on phone: Not on file    Gets together: Not on file    Attends religious service: Not on file    Active member of club or organization: Not on file    Attends meetings of clubs or organizations: Not on file    Relationship status: Not on file  . Intimate partner violence:    Fear of current or ex partner: Not on file    Emotionally abused: Not on file    Physically abused:  Not on file    Forced sexual activity: Not on file  Other Topics Concern  . Not on file  Social History Narrative  . Not on file    Allergies:  Allergies  Allergen Reactions  . Sulfa Antibiotics     Medications: Prior to Admission medications   Medication Sig Start Date End Date Taking? Authorizing Provider  benzoyl peroxide-erythromycin (BENZAMYCIN) gel Apply topically 2 (two) times daily. Patient not taking: Reported on 06/29/2017 10/17/16   Chrismon, Vickki Muff, PA  LORazepam (ATIVAN) 1 MG tablet Take 1 tablet (1 mg total) by mouth every 8 (eight) hours as needed for anxiety. Patient not taking: Reported on 06/29/2017 10/17/16   Chrismon, Vickki Muff, PA    Physical Exam Vitals: Blood pressure 120/70, height 5\' 9"  (1.753 m), weight 148 lb (67.1 kg), last menstrual  period 06/14/2017.  General: NAD HEENT: normocephalic, anicteric Thyroid: no enlargement, no palpable nodules Pulmonary: No increased work of breathing, CTAB Cardiovascular: RRR, distal pulses 2+ Breast: Breast symmetrical, no tenderness, no skin or nipple retraction present, no nipple discharge.  No axillary or supraclavicular lymphadenopathy.  Small breast lumps palpated at   Right : 7 o'clock right Left: Under nipple, 2 o'clock, and 7 o'clock - tender to palpation Abdomen: NABS, soft, non-tender, non-distended.  Umbilicus without lesions.  No hepatomegaly, splenomegaly or masses palpable. No evidence of hernia  Genitourinary:  External: Normal external female genitalia.  Normal urethral meatus, normal Bartholin's and Skene's glands.    Vagina: Normal vaginal mucosa, no evidence of prolapse.    Cervix: Grossly normal in appearance, no bleeding  Uterus: Non-enlarged, mobile, normal contour.  No CMT  Adnexa: ovaries non-enlarged, no adnexal masses  Rectal: deferred  Lymphatic: no evidence of inguinal lymphadenopathy Extremities: no edema, erythema, or tenderness Neurologic: Grossly intact Psychiatric: mood appropriate, affect full  Female chaperone present for pelvic and breast  portions of the physical exam    Assessment: 42 y.o. G1P1001 routine annual exam  Plan: Problem List Items Addressed This Visit    None      1) Mammogram - recommend [place] and diagnostic mammogram for bilateral breast lumps. Imaging studies were ordered today  2) STI screening  wasoffered and declined  3) ASCCP guidelines and rational discussed.  Patient opts for every 3 years screening interval  4) Contraception - the patient is currently using  none.  She is happy with her current form of contraception and plans to continue  5) Colonoscopy -- Screening recommended starting at age 32 for average risk individuals, age 35 for individuals deemed at increased risk (including African Americans) and  recommended to continue until age 71.  For patient age 43-85 individualized approach is recommended.  Gold standard screening is via colonoscopy, Cologuard screening is an acceptable alternative for patient unwilling or unable to undergo colonoscopy.  "Colorectal cancer screening for average?risk adults: 2018 guideline update from the American Cancer Society"CA: A Cancer Journal for Clinicians: Aug 31, 2016   6) Routine healthcare maintenance including cholesterol, diabetes screening discussed managed by PCP  7) No follow-ups on file.   Adrian Prows MD [facility], [facility] 06/29/2017, 8:42 AM

## 2017-07-03 ENCOUNTER — Other Ambulatory Visit: Payer: Self-pay | Admitting: Obstetrics and Gynecology

## 2017-07-03 ENCOUNTER — Inpatient Hospital Stay
Admission: RE | Admit: 2017-07-03 | Discharge: 2017-07-03 | Disposition: A | Discharge status: Home / Self Care | Payer: Self-pay | Source: Ambulatory Visit | Attending: *Deleted | Admitting: *Deleted

## 2017-07-03 ENCOUNTER — Other Ambulatory Visit: Payer: Self-pay | Admitting: *Deleted

## 2017-07-03 DIAGNOSIS — N63 Unspecified lump in unspecified breast: Secondary | ICD-10-CM

## 2017-07-03 DIAGNOSIS — Z9289 Personal history of other medical treatment: Secondary | ICD-10-CM

## 2017-07-03 LAB — PAPIG, HPV, RFX 16/18
HPV, high-risk: NEGATIVE
PAP SMEAR COMMENT: 0

## 2017-07-04 DIAGNOSIS — D485 Neoplasm of uncertain behavior of skin: Secondary | ICD-10-CM | POA: Diagnosis not present

## 2017-07-04 DIAGNOSIS — D225 Melanocytic nevi of trunk: Secondary | ICD-10-CM | POA: Diagnosis not present

## 2017-07-04 DIAGNOSIS — L905 Scar conditions and fibrosis of skin: Secondary | ICD-10-CM | POA: Diagnosis not present

## 2017-07-04 DIAGNOSIS — Z85828 Personal history of other malignant neoplasm of skin: Secondary | ICD-10-CM | POA: Diagnosis not present

## 2017-07-04 NOTE — Progress Notes (Signed)
NIL, released to mychart

## 2017-07-10 ENCOUNTER — Other Ambulatory Visit: Payer: Self-pay

## 2017-07-10 ENCOUNTER — Encounter: Payer: Self-pay | Admitting: Family Medicine

## 2017-07-10 ENCOUNTER — Ambulatory Visit (INDEPENDENT_AMBULATORY_CARE_PROVIDER_SITE_OTHER): Payer: BLUE CROSS/BLUE SHIELD | Admitting: Family Medicine

## 2017-07-10 VITALS — BP 106/60 | HR 58 | Temp 98.4°F | Resp 16 | Ht 69.0 in | Wt 146.0 lb

## 2017-07-10 DIAGNOSIS — Z8639 Personal history of other endocrine, nutritional and metabolic disease: Secondary | ICD-10-CM | POA: Diagnosis not present

## 2017-07-10 DIAGNOSIS — Z0000 Encounter for general adult medical examination without abnormal findings: Secondary | ICD-10-CM

## 2017-07-10 DIAGNOSIS — Z114 Encounter for screening for human immunodeficiency virus [HIV]: Secondary | ICD-10-CM

## 2017-07-10 NOTE — Progress Notes (Signed)
Patient: Megan Cummings, Female    DOB: 1975/06/10, 42 y.o.   MRN: [id number] Visit Date: 07/10/2017  Today's Provider: Vernie Murders, PA   Chief Complaint  Patient presents with  . Annual Exam   Subjective:    Annual physical exam Megan Cummings is a 42 y.o. female who presents today for health maintenance and complete physical. She feels well. She reports exercising daily at a 1 hour lunch work out. She reports she is sleeping well.  Pap- 06/29/2017 Mammogram- scheduled for 07/12/2017 Tdap- 10/28/2010   Review of Systems  Constitutional: Negative.   HENT: Negative.   Eyes: Negative.   Respiratory: Negative.   Cardiovascular: Negative.   Gastrointestinal: Negative.   Endocrine: Negative.   Genitourinary: Negative.   Musculoskeletal: Negative.   Skin: Negative.   Neurological: Negative.   Hematological: Negative.   Psychiatric/Behavioral: Negative.     Social History      She  reports that she has quit smoking. Her smoking use included cigarettes. She has a 5.00 pack-year smoking history. She has never used smokeless tobacco. She reports that she does not drink alcohol or use drugs.       Social History   Socioeconomic History  . Marital status: Married    Spouse name: Not on file  . Number of children: Not on file  . Years of education: Not on file  . Highest education level: Not on file  Occupational History  . Not on file  Social Needs  . Financial resource strain: Not on file  . Food insecurity:    Worry: Not on file    Inability: Not on file  . Transportation needs:    Medical: Not on file    Non-medical: Not on file  Tobacco Use  . Smoking status: Former Smoker    Packs/day: 0.50    Years: 10.00    Pack years: 5.00    Types: Cigarettes  . Smokeless tobacco: Never Used  Substance and Sexual Activity  . Alcohol use: Never    Alcohol/week: 0.0 oz    Frequency: Never    Comment: Occasionally drinks wine  . Drug use: No  . Sexual  activity: Yes  Lifestyle  . Physical activity:    Days per week: Not on file    Minutes per session: Not on file  . Stress: Not on file  Relationships  . Social connections:    Talks on phone: Not on file    Gets together: Not on file    Attends religious service: Not on file    Active member of club or organization: Not on file    Attends meetings of clubs or organizations: Not on file    Relationship status: Not on file  Other Topics Concern  . Not on file  Social History Narrative  . Not on file    Past Medical History:  Diagnosis Date  . Fibrocystic breast   . Stress incontinence   . Umbilical hernia 85/0277   Patient Active Problem List   Diagnosis Date Noted  . Umbilical hernia 41/28/7867  . Personal history of urinary calculi 03/13/2015  . Awareness of heartbeats 03/13/2015  . Achilles rupture 03/13/2015  . Avitaminosis D 08/25/2009  . Pure hypercholesterolemia 02/02/2007  . Episodic paroxysmal anxiety disorder 02/14/2006   Past Surgical History:  Procedure Laterality Date  . FOOT SURGERY Right 07/2010   Family History        Family Status  Relation Name Status  .  Mother  Alive  . Father  Alive  . Brother  Alive  . Son  Alive  . MGM  Alive  . MGF  Deceased       suicide  . PGF  Deceased  . PGM  (Not Specified)        Her family history includes Colon cancer in her paternal grandmother; Emphysema in her maternal grandfather; Healthy in her brother, maternal grandmother, mother, and son; Hyperlipidemia in her father; Lung cancer in her paternal grandfather.     Allergies  Allergen Reactions  . Sulfa Antibiotics    No current outpatient medications on file.   Patient Care Team: Raima Geathers, Vickki Muff, PA as PCP - General (Family Medicine)     Objective:   Vitals: BP 106/60 (BP Location: Right Arm, Patient Position: Sitting, Cuff Size: Normal)   Pulse (!) 58   Temp 98.4 F (36.9 C)   Resp 16   Ht 5\' 9"  (1.753 m)   Wt 146 lb (66.2 kg)   LMP  06/14/2017 (Exact Date)   BMI 21.56 kg/m    Vitals:   07/10/17 0813  BP: 106/60  Pulse: (!) 58  Resp: 16  Temp: 98.4 F (36.9 C)  Weight: 146 lb (66.2 kg)  Height: 5\' 9"  (1.753 m)    Physical Exam  Constitutional: She is oriented to person, place, and time. She appears well-developed and well-nourished.  HENT:  Head: Normocephalic and atraumatic.  Right Ear: External ear normal.  Left Ear: External ear normal.  Nose: Nose normal.  Mouth/Throat: Oropharynx is clear and moist.  Eyes: Pupils are equal, round, and reactive to light. Conjunctivae and EOM are normal. Right eye exhibits no discharge.  Neck: Normal range of motion. Neck supple. No tracheal deviation present. No thyromegaly present.  Cardiovascular: Normal rate, regular rhythm, normal heart sounds and intact distal pulses.  No murmur heard. Pulmonary/Chest: Effort normal and breath sounds normal. No respiratory distress. She has no wheezes. She has no rales. She exhibits no tenderness.  Abdominal: Soft. She exhibits no distension and no mass. There is no tenderness. There is no rebound and no guarding.  Genitourinary:  Genitourinary Comments: Had GYN exam 06-29-17. LMP 06-14-17 on regular schedule.  Musculoskeletal: Normal range of motion. She exhibits no edema or tenderness.  Lymphadenopathy:    She has no cervical adenopathy.  Neurological: She is alert and oriented to person, place, and time. She has normal reflexes. She displays normal reflexes. No cranial nerve deficit. She exhibits normal muscle tone. Coordination normal.  Skin: Skin is warm and dry. No rash noted. No erythema.  Psychiatric: She has a normal mood and affect. Her behavior is normal. Judgment and thought content normal.    Depression Screen PHQ 2/9 Scores 07/10/2017  PHQ - 2 Score 0   Assessment & Plan:     Routine Health Maintenance and Physical Exam  Exercise Activities and Dietary recommendations Goals    Continue 1 hour lunch workout  daily.      Immunization History  Administered Date(s) Administered  . Tdap 10/28/2010   Health Maintenance  Topic Date Due  . HIV Screening  02/07/1989  . MAMMOGRAM  12/31/2015  . INFLUENZA VACCINE  11/02/2017  . PAP SMEAR  06/29/2020  . TETANUS/TDAP  10/27/2020   Discussed health benefits of physical activity, and encouraged her to engage in regular exercise appropriate for her age and condition.    1. Annual physical exam Good general health. Immunizations up to date. Mammogram scheduled for  07-12-17 by GYN that did PAP and breast exam on 06-29-17. Also, they discussed colonoscopy or Cologuard at age 39. Check routine labs and given anticipatory counseling. - Comprehensive metabolic panel - CBC with Differential/Platelet - Lipid panel - TSH  2. Hx of hypercholesterolemia Continues daily workout program at lunch. BMI 21. Recheck labs and follow up pending reports. - Comprehensive metabolic panel - Lipid panel - TSH  3. Screening for HIV (human immunodeficiency virus) - HIV antibody   Vernie Murders, PA  [facility]

## 2017-07-11 LAB — COMPREHENSIVE METABOLIC PANEL
ALK PHOS: 55 IU/L (ref 39–117)
ALT: 11 IU/L (ref 0–32)
AST: 17 IU/L (ref 0–40)
Albumin/Globulin Ratio: 2.3 — ABNORMAL HIGH (ref 1.2–2.2)
Albumin: 4.8 g/dL (ref 3.5–5.5)
BILIRUBIN TOTAL: 0.7 mg/dL (ref 0.0–1.2)
BUN/Creatinine Ratio: 17 (ref 9–23)
BUN: 14 mg/dL (ref 6–24)
CHLORIDE: 104 mmol/L (ref 96–106)
CO2: 24 mmol/L (ref 20–29)
Calcium: 9.4 mg/dL (ref 8.7–10.2)
Creatinine, Ser: 0.82 mg/dL (ref 0.57–1.00)
GFR calc Af Amer: 101 mL/min/{1.73_m2} (ref >59)
GFR calc non Af Amer: 88 mL/min/{1.73_m2} (ref >59)
GLUCOSE: 91 mg/dL (ref 65–99)
Globulin, Total: 2.1 g/dL (ref 1.5–4.5)
Potassium: 4.6 mmol/L (ref 3.5–5.2)
Sodium: 143 mmol/L (ref 134–144)
Total Protein: 6.9 g/dL (ref 6.0–8.5)

## 2017-07-11 LAB — CBC WITH DIFFERENTIAL/PLATELET
BASOS ABS: 0.1 10*3/uL (ref 0.0–0.2)
Basos: 1 %
EOS (ABSOLUTE): 0.1 10*3/uL (ref 0.0–0.4)
Eos: 2 %
HEMOGLOBIN: 12.2 g/dL (ref 11.1–15.9)
Hematocrit: 37.6 % (ref 34.0–46.6)
IMMATURE GRANULOCYTES: 0 %
Immature Grans (Abs): 0 10*3/uL (ref 0.0–0.1)
LYMPHS ABS: 1.4 10*3/uL (ref 0.7–3.1)
Lymphs: 23 %
MCH: 29.6 pg (ref 26.6–33.0)
MCHC: 32.4 g/dL (ref 31.5–35.7)
MCV: 91 fL (ref 79–97)
Monocytes Absolute: 0.6 10*3/uL (ref 0.1–0.9)
Monocytes: 11 %
NEUTROS PCT: 63 %
Neutrophils Absolute: 3.7 10*3/uL (ref 1.4–7.0)
Platelets: 404 10*3/uL — ABNORMAL HIGH (ref 150–379)
RBC: 4.12 x10E6/uL (ref 3.77–5.28)
RDW: 13.8 % (ref 12.3–15.4)
WBC: 5.8 10*3/uL (ref 3.4–10.8)

## 2017-07-11 LAB — LIPID PANEL
CHOL/HDL RATIO: 2.9 ratio (ref 0.0–4.4)
CHOLESTEROL TOTAL: 155 mg/dL (ref 100–199)
HDL: 54 mg/dL (ref >39)
LDL CALC: 89 mg/dL (ref 0–99)
TRIGLYCERIDES: 62 mg/dL (ref 0–149)
VLDL Cholesterol Cal: 12 mg/dL (ref 5–40)

## 2017-07-11 LAB — TSH: TSH: 0.877 u[IU]/mL (ref 0.450–4.500)

## 2017-07-11 LAB — HIV ANTIBODY (ROUTINE TESTING W REFLEX): HIV Screen 4th Generation wRfx: NONREACTIVE

## 2017-07-12 ENCOUNTER — Ambulatory Visit
Admission: RE | Admit: 2017-07-12 | Discharge: 2017-07-12 | Disposition: A | Discharge status: Home / Self Care | Payer: BLUE CROSS/BLUE SHIELD | Source: Ambulatory Visit | Attending: Obstetrics and Gynecology | Admitting: Obstetrics and Gynecology

## 2017-07-12 DIAGNOSIS — R928 Other abnormal and inconclusive findings on diagnostic imaging of breast: Secondary | ICD-10-CM | POA: Diagnosis not present

## 2017-07-12 DIAGNOSIS — N6002 Solitary cyst of left breast: Secondary | ICD-10-CM | POA: Insufficient documentation

## 2017-07-12 DIAGNOSIS — N6320 Unspecified lump in the left breast, unspecified quadrant: Secondary | ICD-10-CM | POA: Diagnosis present

## 2017-07-12 DIAGNOSIS — N6001 Solitary cyst of right breast: Secondary | ICD-10-CM | POA: Insufficient documentation

## 2017-07-12 DIAGNOSIS — N63 Unspecified lump in unspecified breast: Secondary | ICD-10-CM

## 2017-07-12 DIAGNOSIS — N6310 Unspecified lump in the right breast, unspecified quadrant: Secondary | ICD-10-CM | POA: Diagnosis present

## 2017-07-12 DIAGNOSIS — R922 Inconclusive mammogram: Secondary | ICD-10-CM | POA: Diagnosis not present

## 2017-08-22 DIAGNOSIS — Z0000 Encounter for general adult medical examination without abnormal findings: Secondary | ICD-10-CM | POA: Diagnosis not present

## 2017-08-22 DIAGNOSIS — Z720 Tobacco use: Secondary | ICD-10-CM | POA: Diagnosis not present

## 2017-08-22 DIAGNOSIS — N529 Male erectile dysfunction, unspecified: Secondary | ICD-10-CM | POA: Diagnosis not present

## 2017-08-22 DIAGNOSIS — E782 Mixed hyperlipidemia: Secondary | ICD-10-CM | POA: Diagnosis not present

## 2017-09-20 DIAGNOSIS — Z713 Dietary counseling and surveillance: Secondary | ICD-10-CM | POA: Diagnosis not present

## 2017-12-11 DIAGNOSIS — D485 Neoplasm of uncertain behavior of skin: Secondary | ICD-10-CM | POA: Diagnosis not present

## 2017-12-11 DIAGNOSIS — D2239 Melanocytic nevi of other parts of face: Secondary | ICD-10-CM | POA: Diagnosis not present

## 2017-12-11 DIAGNOSIS — Z1283 Encounter for screening for malignant neoplasm of skin: Secondary | ICD-10-CM | POA: Diagnosis not present

## 2017-12-11 DIAGNOSIS — L812 Freckles: Secondary | ICD-10-CM | POA: Diagnosis not present

## 2017-12-11 DIAGNOSIS — Z85828 Personal history of other malignant neoplasm of skin: Secondary | ICD-10-CM | POA: Diagnosis not present

## 2017-12-11 DIAGNOSIS — D2262 Melanocytic nevi of left upper limb, including shoulder: Secondary | ICD-10-CM | POA: Diagnosis not present

## 2017-12-11 DIAGNOSIS — L578 Other skin changes due to chronic exposure to nonionizing radiation: Secondary | ICD-10-CM | POA: Diagnosis not present

## 2018-01-02 DIAGNOSIS — D2371 Other benign neoplasm of skin of right lower limb, including hip: Secondary | ICD-10-CM | POA: Diagnosis not present

## 2018-01-02 DIAGNOSIS — N529 Male erectile dysfunction, unspecified: Secondary | ICD-10-CM | POA: Diagnosis not present

## 2018-01-02 DIAGNOSIS — E782 Mixed hyperlipidemia: Secondary | ICD-10-CM | POA: Diagnosis not present

## 2018-05-03 DIAGNOSIS — N529 Male erectile dysfunction, unspecified: Secondary | ICD-10-CM | POA: Diagnosis not present

## 2018-05-03 DIAGNOSIS — E782 Mixed hyperlipidemia: Secondary | ICD-10-CM | POA: Diagnosis not present

## 2018-06-11 DIAGNOSIS — D229 Melanocytic nevi, unspecified: Secondary | ICD-10-CM | POA: Diagnosis not present

## 2018-06-11 DIAGNOSIS — Z85828 Personal history of other malignant neoplasm of skin: Secondary | ICD-10-CM | POA: Diagnosis not present

## 2018-06-11 DIAGNOSIS — D225 Melanocytic nevi of trunk: Secondary | ICD-10-CM | POA: Diagnosis not present

## 2018-06-11 DIAGNOSIS — L820 Inflamed seborrheic keratosis: Secondary | ICD-10-CM | POA: Diagnosis not present

## 2018-06-11 DIAGNOSIS — L700 Acne vulgaris: Secondary | ICD-10-CM | POA: Diagnosis not present

## 2018-06-18 NOTE — Progress Notes (Signed)
Patient: Megan Cummings, Female    DOB: 1975-10-24, 43 y.o.   MRN: [id number] Visit Date: 06/19/2018  Today's Provider: Vernie Murders, PA   Chief Complaint  Patient presents with  . Annual Exam   Subjective:     Annual physical exam Megan Cummings is a 43 y.o. female who presents today for health maintenance and complete physical. She feels well. She reports exercising yes/daily. She reports she is sleeping fairly well.  -----------------------------------------------------------------   Review of Systems  Allergic/Immunologic: Positive for environmental allergies.  All other systems reviewed and are negative.   Social History      She  reports that she has quit smoking. Her smoking use included cigarettes. She has a 5.00 pack-year smoking history. She has never used smokeless tobacco. She reports that she does not drink alcohol or use drugs.       Social History   Socioeconomic History  . Marital status: Married    Spouse name: Not on file  . Number of children: Not on file  . Years of education: Not on file  . Highest education level: Not on file  Occupational History  . Not on file  Social Needs  . Financial resource strain: Not on file  . Food insecurity:    Worry: Not on file    Inability: Not on file  . Transportation needs:    Medical: Not on file    Non-medical: Not on file  Tobacco Use  . Smoking status: Former Smoker    Packs/day: 0.50    Years: 10.00    Pack years: 5.00    Types: Cigarettes  . Smokeless tobacco: Never Used  Substance and Sexual Activity  . Alcohol use: Never    Alcohol/week: 0.0 standard drinks    Frequency: Never    Comment: Occasionally drinks wine  . Drug use: No  . Sexual activity: Yes  Lifestyle  . Physical activity:    Days per week: Not on file    Minutes per session: Not on file  . Stress: Not on file  Relationships  . Social connections:    Talks on phone: Not on file    Gets together: Not on file     Attends religious service: Not on file    Active member of club or organization: Not on file    Attends meetings of clubs or organizations: Not on file    Relationship status: Not on file  Other Topics Concern  . Not on file  Social History Narrative  . Not on file   Past Medical History:  Diagnosis Date  . Fibrocystic breast   . Stress incontinence   . Umbilical hernia 82/9937   Patient Active Problem List   Diagnosis Date Noted  . Umbilical hernia 16/96/7893  . Personal history of urinary calculi 03/13/2015  . Awareness of heartbeats 03/13/2015  . Achilles rupture 03/13/2015  . Avitaminosis D 08/25/2009  . Pure hypercholesterolemia 02/02/2007  . Episodic paroxysmal anxiety disorder 02/14/2006   Past Surgical History:  Procedure Laterality Date  . FOOT SURGERY Right 07/2010   Family History        Family Status  Relation Name Status  . Mother  Alive  . Father  Alive  . Brother  Alive  . Son  Alive  . MGM  Alive  . MGF  Deceased       suicide  . PGF  Deceased  . PGM  Deceased  . Neg Hx  (  Not Specified)        Her family history includes Colon cancer in her paternal grandmother; Emphysema in her maternal grandfather; Healthy in her brother, maternal grandmother, mother, and son; Hyperlipidemia in her father; Lung cancer in her paternal grandfather. There is no history of Breast cancer.     Allergies  Allergen Reactions  . Sulfa Antibiotics    No current outpatient medications on file.   Patient Care Team: Chrismon, Vickki Muff, PA as PCP - General (Family Medicine)    Objective:    Vitals: BP 120/84 (BP Location: Right Arm, Patient Position: Sitting, Cuff Size: Normal)   Pulse 67   Temp 98 F (36.7 C) (Oral)   Resp 16   Ht 5\' 9"  (1.753 m)   Wt 147 lb (66.7 kg)   SpO2 98%   BMI 21.71 kg/m    Vitals:   06/19/18 1325  BP: 120/84  Pulse: 67  Resp: 16  Temp: 98 F (36.7 C)  TempSrc: Oral  SpO2: 98%  Weight: 147 lb (66.7 kg)  Height: 5\' 9"  (1.753  m)    Physical Exam Constitutional:      Appearance: She is well-developed.  HENT:     Head: Normocephalic and atraumatic.     Right Ear: External ear normal.     Left Ear: External ear normal.     Nose: Nose normal.  Eyes:     General:        Right eye: No discharge.     Conjunctiva/sclera: Conjunctivae normal.     Pupils: Pupils are equal, round, and reactive to light.  Neck:     Musculoskeletal: Normal range of motion and neck supple.     Thyroid: No thyromegaly.     Trachea: No tracheal deviation.  Cardiovascular:     Rate and Rhythm: Normal rate and regular rhythm.     Heart sounds: Normal heart sounds. No murmur.  Pulmonary:     Effort: Pulmonary effort is normal. No respiratory distress.     Breath sounds: Normal breath sounds. No wheezing or rales.  Chest:     Chest wall: No tenderness.  Abdominal:     General: There is no distension.     Palpations: Abdomen is soft. There is no mass.     Tenderness: There is no abdominal tenderness. There is no guarding or rebound.  Musculoskeletal: Normal range of motion.        General: No tenderness.  Lymphadenopathy:     Cervical: No cervical adenopathy.  Skin:    General: Skin is warm and dry.     Findings: No erythema or rash.  Neurological:     Mental Status: She is alert and oriented to person, place, and time.     Cranial Nerves: No cranial nerve deficit.     Motor: No abnormal muscle tone.     Coordination: Coordination normal.     Deep Tendon Reflexes: Reflexes are normal and symmetric. Reflexes normal.  Psychiatric:        Behavior: Behavior normal.        Thought Content: Thought content normal.        Judgment: Judgment normal.     Depression Screen PHQ 2/9 Scores 06/19/2018 07/10/2017  PHQ - 2 Score 0 0  PHQ- 9 Score 1 -      Assessment & Plan:     Routine Health Maintenance and Physical Exam  Exercise Activities and Dietary recommendations Goals   Goes to the gym for a  1 hour workout daily.       Immunization History  Administered Date(s) Administered  . Tdap 10/28/2010    Health Maintenance  Topic Date Due  . INFLUENZA VACCINE  11/02/2017  . MAMMOGRAM  07/13/2018  . PAP SMEAR-Modifier  06/29/2020  . TETANUS/TDAP  10/27/2020  . HIV Screening  Completed    Discussed health benefits of physical activity, and encouraged her to engage in regular exercise appropriate for her age and condition.    -------------------------------------------------------------------- 1. Annual physical exam Good general health. Immunizations, mammograms and PAP smear up to date. Has GYN exam planned for next month. Given anticipatory counseling. Encouraged to continue exercise program and will check routine labs. - CBC with Differential/Platelet - Comprehensive metabolic panel - Lipid panel - TSH  2. Pure hypercholesterolemia Has maintained control of cholesterol with diet and exercise program. Lipids in very good shape last year. Will recheck labs and recheck pending reports. - CBC with Differential/Platelet - Comprehensive metabolic panel - Lipid panel - TSH    Vernie Murders, PA  [facility]

## 2018-06-19 ENCOUNTER — Encounter: Payer: Self-pay | Admitting: Family Medicine

## 2018-06-19 ENCOUNTER — Other Ambulatory Visit: Payer: Self-pay

## 2018-06-19 ENCOUNTER — Ambulatory Visit (INDEPENDENT_AMBULATORY_CARE_PROVIDER_SITE_OTHER): Payer: BLUE CROSS/BLUE SHIELD | Admitting: Family Medicine

## 2018-06-19 VITALS — BP 120/84 | HR 67 | Temp 98.0°F | Resp 16 | Ht 69.0 in | Wt 147.0 lb

## 2018-06-19 DIAGNOSIS — E7800 Pure hypercholesterolemia, unspecified: Secondary | ICD-10-CM

## 2018-06-19 DIAGNOSIS — Z0000 Encounter for general adult medical examination without abnormal findings: Secondary | ICD-10-CM

## 2018-06-20 DIAGNOSIS — E7800 Pure hypercholesterolemia, unspecified: Secondary | ICD-10-CM | POA: Diagnosis not present

## 2018-06-20 DIAGNOSIS — Z0000 Encounter for general adult medical examination without abnormal findings: Secondary | ICD-10-CM | POA: Diagnosis not present

## 2018-06-21 ENCOUNTER — Telehealth: Payer: Self-pay | Admitting: *Deleted

## 2018-06-21 LAB — COMPREHENSIVE METABOLIC PANEL
A/G RATIO: 2.3 — AB (ref 1.2–2.2)
ALBUMIN: 4.6 g/dL (ref 3.8–4.8)
ALT: 10 IU/L (ref 0–32)
AST: 16 IU/L (ref 0–40)
Alkaline Phosphatase: 50 IU/L (ref 39–117)
BILIRUBIN TOTAL: 0.7 mg/dL (ref 0.0–1.2)
BUN / CREAT RATIO: 14 (ref 9–23)
BUN: 14 mg/dL (ref 6–24)
CALCIUM: 9.4 mg/dL (ref 8.7–10.2)
CHLORIDE: 102 mmol/L (ref 96–106)
CO2: 20 mmol/L (ref 20–29)
Creatinine, Ser: 1.03 mg/dL — ABNORMAL HIGH (ref 0.57–1.00)
GFR calc non Af Amer: 66 mL/min/{1.73_m2} (ref >59)
GFR, EST AFRICAN AMERICAN: 76 mL/min/{1.73_m2} (ref >59)
Globulin, Total: 2 g/dL (ref 1.5–4.5)
Glucose: 94 mg/dL (ref 65–99)
POTASSIUM: 4.4 mmol/L (ref 3.5–5.2)
Sodium: 138 mmol/L (ref 134–144)
TOTAL PROTEIN: 6.6 g/dL (ref 6.0–8.5)

## 2018-06-21 LAB — CBC WITH DIFFERENTIAL/PLATELET
BASOS: 1 %
Basophils Absolute: 0.1 10*3/uL (ref 0.0–0.2)
EOS (ABSOLUTE): 0.1 10*3/uL (ref 0.0–0.4)
EOS: 1 %
HEMATOCRIT: 36.9 % (ref 34.0–46.6)
Hemoglobin: 12.4 g/dL (ref 11.1–15.9)
IMMATURE GRANS (ABS): 0 10*3/uL (ref 0.0–0.1)
IMMATURE GRANULOCYTES: 0 %
LYMPHS: 28 %
Lymphocytes Absolute: 1.5 10*3/uL (ref 0.7–3.1)
MCH: 30.5 pg (ref 26.6–33.0)
MCHC: 33.6 g/dL (ref 31.5–35.7)
MCV: 91 fL (ref 79–97)
Monocytes Absolute: 0.5 10*3/uL (ref 0.1–0.9)
Monocytes: 10 %
NEUTROS PCT: 60 %
Neutrophils Absolute: 3.1 10*3/uL (ref 1.4–7.0)
Platelets: 388 10*3/uL (ref 150–450)
RBC: 4.06 x10E6/uL (ref 3.77–5.28)
RDW: 12.1 % (ref 11.7–15.4)
WBC: 5.2 10*3/uL (ref 3.4–10.8)

## 2018-06-21 LAB — LIPID PANEL
CHOL/HDL RATIO: 3 ratio (ref 0.0–4.4)
Cholesterol, Total: 169 mg/dL (ref 100–199)
HDL: 57 mg/dL (ref >39)
LDL Calculated: 98 mg/dL (ref 0–99)
Triglycerides: 68 mg/dL (ref 0–149)
VLDL CHOLESTEROL CAL: 14 mg/dL (ref 5–40)

## 2018-06-21 LAB — TSH: TSH: 1.64 u[IU]/mL (ref 0.450–4.500)

## 2018-06-21 NOTE — Telephone Encounter (Signed)
-----   Message from Margo Common, [place] sent at 06/21/2018 10:11 AM EDT ----- All labs essentially normal with only slight variations. Recommend a little more water intake to set all this back in order. Recheck annually.

## 2018-06-21 NOTE — Telephone Encounter (Signed)
LMOVM for pt to return call 

## 2018-06-22 NOTE — Telephone Encounter (Signed)
LMOVM for pt to return call 

## 2018-06-26 NOTE — Telephone Encounter (Signed)
Patient was notified of results. Expressed understanding.  

## 2018-07-02 ENCOUNTER — Ambulatory Visit: Payer: BLUE CROSS/BLUE SHIELD | Admitting: Obstetrics and Gynecology

## 2018-08-30 ENCOUNTER — Ambulatory Visit (INDEPENDENT_AMBULATORY_CARE_PROVIDER_SITE_OTHER): Payer: BLUE CROSS/BLUE SHIELD | Admitting: Obstetrics and Gynecology

## 2018-08-30 ENCOUNTER — Other Ambulatory Visit: Payer: Self-pay

## 2018-08-30 ENCOUNTER — Encounter: Payer: Self-pay | Admitting: Obstetrics and Gynecology

## 2018-08-30 VITALS — BP 140/88 | HR 76 | Ht 69.0 in | Wt 148.0 lb

## 2018-08-30 DIAGNOSIS — Z1239 Encounter for other screening for malignant neoplasm of breast: Secondary | ICD-10-CM

## 2018-08-30 DIAGNOSIS — Z0000 Encounter for general adult medical examination without abnormal findings: Secondary | ICD-10-CM

## 2018-08-30 DIAGNOSIS — Z01419 Encounter for gynecological examination (general) (routine) without abnormal findings: Secondary | ICD-10-CM | POA: Diagnosis not present

## 2018-08-30 NOTE — Progress Notes (Signed)
Gynecology Annual Exam  PCP: Margo Common, [place]  Chief Complaint:  Chief Complaint  Patient presents with  . Gynecologic Exam    History of Present Illness: Patient is a 43 y.o. G1P1001 presents for annual exam. The patient has no complaints today.   LMP: Patient's last menstrual period was 08/20/2018 (exact date). Average Interval: regular, 30 days Duration of flow: 2 days Heavy Menses: no Clots: no Intermenstrual Bleeding: no Postcoital Bleeding: no Dysmenorrhea: no   The patient is sexually active. She currently uses none for contraception. She denies dyspareunia.  The patient does perform self breast exams.  There is no notable family history of breast or ovarian cancer in her family.  The patient wears seatbelts: yes.   The patient has regular exercise: yes.    The patient denies current symptoms of depression.    Review of Systems: ROS  Past Medical History:  Past Medical History:  Diagnosis Date  . Fibrocystic breast   . Stress incontinence   . Umbilical hernia 70/2637    Past Surgical History:  Past Surgical History:  Procedure Laterality Date  . FOOT SURGERY Right 07/2010    Gynecologic History:  Patient's last menstrual period was 08/20/2018 (exact date). Contraception: none Last Pap: Results were: 2019 NIL, HPV negative  Last mammogram: 2019 Results were: BI-RAD II  Obstetric History: G1P1001  Family History:  Family History  Problem Relation Age of Onset  . Healthy Mother   . Hyperlipidemia Father   . Healthy Brother   . Healthy Son   . Healthy Maternal Grandmother   . Emphysema Maternal Grandfather   . Lung cancer Paternal Grandfather   . Colon cancer Paternal Grandmother   . Breast cancer Neg Hx     Social History:  Social History   Socioeconomic History  . Marital status: Married    Spouse name: Not on file  . Number of children: Not on file  . Years of education: Not on file  . Highest education level: Not on file   Occupational History  . Not on file  Social Needs  . Financial resource strain: Not on file  . Food insecurity:    Worry: Not on file    Inability: Not on file  . Transportation needs:    Medical: Not on file    Non-medical: Not on file  Tobacco Use  . Smoking status: Former Smoker    Packs/day: 0.50    Years: 10.00    Pack years: 5.00    Types: Cigarettes  . Smokeless tobacco: Never Used  Substance and Sexual Activity  . Alcohol use: Never    Alcohol/week: 0.0 standard drinks    Frequency: Never    Comment: Occasionally drinks wine  . Drug use: No  . Sexual activity: Yes    Birth control/protection: None  Lifestyle  . Physical activity:    Days per week: Not on file    Minutes per session: Not on file  . Stress: Not on file  Relationships  . Social connections:    Talks on phone: Not on file    Gets together: Not on file    Attends religious service: Not on file    Active member of club or organization: Not on file    Attends meetings of clubs or organizations: Not on file    Relationship status: Not on file  . Intimate partner violence:    Fear of current or ex partner: Not on file    Emotionally  abused: Not on file    Physically abused: Not on file    Forced sexual activity: Not on file  Other Topics Concern  . Not on file  Social History Narrative  . Not on file    Allergies:  Allergies  Allergen Reactions  . Sulfa Antibiotics     Medications: Prior to Admission medications   Not on File    Physical Exam Vitals: Blood pressure 140/88, pulse 76, height 5\' 9"  (1.753 m), weight 148 lb (67.1 kg), last menstrual period 08/20/2018.  General: NAD HEENT: normocephalic, anicteric Thyroid: no enlargement, no palpable nodules Pulmonary: No increased work of breathing, CTAB Cardiovascular: RRR, distal pulses 2+ Breast: Breast symmetrical, no tenderness, no palpable nodules or masses, no skin or nipple retraction present, no nipple discharge.  No axillary  or supraclavicular lymphadenopathy. Abdomen: NABS, soft, non-tender, non-distended.  Umbilicus without lesions.  No hepatomegaly, splenomegaly or masses palpable. No evidence of hernia  Genitourinary:  External: Normal external female genitalia.  Normal urethral meatus, normal Bartholin's and Skene's glands.    Vagina: Normal vaginal mucosa, no evidence of prolapse.    Cervix: Grossly normal in appearance, no bleeding  Uterus: Non-enlarged, mobile, normal contour.  No CMT  Adnexa: ovaries non-enlarged, no adnexal masses  Rectal: deferred  Lymphatic: no evidence of inguinal lymphadenopathy Extremities: no edema, erythema, or tenderness Neurologic: Grossly intact Psychiatric: mood appropriate, affect full  Female chaperone present for pelvic and breast  portions of the physical exam    Assessment: 43 y.o. G1P1001 routine annual exam  Plan: Problem List Items Addressed This Visit    None    Visit Diagnoses    Healthcare maintenance    -  Primary   Well woman exam with routine gynecological exam       Breast cancer screening          1) Mammogram - recommend yearly screening mammogram.  Mammogram Was ordered today   2) STI screening  wasoffered and declined  3) ASCCP guidelines and rational discussed.  Patient opts for every 5 years screening interval  4) Contraception - the patient is currently using  none.  She is happy with her current form of contraception and plans to continue  5) Colonoscopy -- Screening recommended starting at age 45 for average risk individuals, age 75 for individuals deemed at increased risk (including African Americans) and recommended to continue until age 61.  For patient age 7-85 individualized approach is recommended.  Gold standard screening is via colonoscopy, Cologuard screening is an acceptable alternative for patient unwilling or unable to undergo colonoscopy.  "Colorectal cancer screening for average?risk adults: 2018 guideline update from the  American Cancer Society"[place]: A Cancer Journal for Clinicians: Aug 31, 2016   6) Routine healthcare maintenance including cholesterol, diabetes screening discussed managed by PCP  7) Initial BP elevated today, repeat normal 120/70.   8)  Return in about 1 year (around 08/30/2019).  Adrian Prows MD [facility] 08/30/2018 9:38 AM

## 2019-01-24 DIAGNOSIS — S63601A Unspecified sprain of right thumb, initial encounter: Secondary | ICD-10-CM | POA: Diagnosis not present

## 2019-01-24 DIAGNOSIS — S63609A Unspecified sprain of unspecified thumb, initial encounter: Secondary | ICD-10-CM | POA: Insufficient documentation

## 2019-08-19 ENCOUNTER — Ambulatory Visit (INDEPENDENT_AMBULATORY_CARE_PROVIDER_SITE_OTHER): Payer: Self-pay

## 2019-08-19 ENCOUNTER — Other Ambulatory Visit: Payer: Self-pay

## 2019-08-19 DIAGNOSIS — L680 Hirsutism: Secondary | ICD-10-CM

## 2019-08-19 NOTE — Progress Notes (Signed)
Plan for another BBL photofacial in the fall. jj

## 2019-09-04 ENCOUNTER — Encounter: Payer: Self-pay | Admitting: Obstetrics and Gynecology

## 2019-09-04 ENCOUNTER — Other Ambulatory Visit: Payer: Self-pay

## 2019-09-04 ENCOUNTER — Ambulatory Visit (INDEPENDENT_AMBULATORY_CARE_PROVIDER_SITE_OTHER): Payer: BC Managed Care – PPO | Admitting: Obstetrics and Gynecology

## 2019-09-04 ENCOUNTER — Other Ambulatory Visit (HOSPITAL_COMMUNITY)
Admission: RE | Admit: 2019-09-04 | Discharge: 2019-09-04 | Disposition: A | Discharge status: Home / Self Care | Payer: BC Managed Care – PPO | Source: Ambulatory Visit | Attending: Obstetrics and Gynecology | Admitting: Obstetrics and Gynecology

## 2019-09-04 VITALS — BP 120/76 | Ht 69.0 in | Wt 146.6 lb

## 2019-09-04 DIAGNOSIS — N760 Acute vaginitis: Secondary | ICD-10-CM

## 2019-09-04 DIAGNOSIS — Z01419 Encounter for gynecological examination (general) (routine) without abnormal findings: Secondary | ICD-10-CM

## 2019-09-04 DIAGNOSIS — Z1231 Encounter for screening mammogram for malignant neoplasm of breast: Secondary | ICD-10-CM | POA: Diagnosis not present

## 2019-09-04 DIAGNOSIS — Z1329 Encounter for screening for other suspected endocrine disorder: Secondary | ICD-10-CM

## 2019-09-04 DIAGNOSIS — Z0000 Encounter for general adult medical examination without abnormal findings: Secondary | ICD-10-CM | POA: Diagnosis not present

## 2019-09-04 DIAGNOSIS — N841 Polyp of cervix uteri: Secondary | ICD-10-CM | POA: Diagnosis not present

## 2019-09-04 DIAGNOSIS — N930 Postcoital and contact bleeding: Secondary | ICD-10-CM

## 2019-09-04 NOTE — Progress Notes (Signed)
Gynecology Annual Exam  PCP: Margo Common, [place]  Chief Complaint:  Chief Complaint  Patient presents with  . Gynecologic Exam    History of Present Illness: Patient is a 44 y.o. G1P1001 presents for annual exam. The patient has no complaints today.   LMP: Patient's last menstrual period was 08/28/2019. Average Interval:monthly  Duration of flow: 3 days Heavy Menses: no Clots: no Intermenstrual Bleeding: yes Postcoital Bleeding: yes Dysmenorrhea: no   The patient is sexually active. She denies dyspareunia.  The patient does perform self breast exams.  There is no notable family history of breast or ovarian cancer in her family.     The patient has regular exercise: yes.    The patient denies current symptoms of depression.    Review of Systems: ROS  Past Medical History:  Patient Active Problem List   Diagnosis Date Noted  . Umbilical hernia [id number]  . Personal history of urinary calculi 03/13/2015  . Awareness of heartbeats 03/13/2015  . Achilles rupture 03/13/2015  . Avitaminosis D 08/25/2009  . Pure hypercholesterolemia 02/02/2007  . Episodic paroxysmal anxiety disorder 02/14/2006    Past Surgical History:  Past Surgical History:  Procedure Laterality Date  . FOOT SURGERY Right 07/2010    Gynecologic History:  Patient's last menstrual period was 08/28/2019. Last Pap: Results were: 2019 NIL and HR HPV negative  Last mammogram: 2019  Results were: BI-RAD II  Obstetric History: G1P1001  Family History:  Family History  Problem Relation Age of Onset  . Healthy Mother   . Hyperlipidemia Father   . Healthy Brother   . Healthy Son   . Healthy Maternal Grandmother   . Emphysema Maternal Grandfather   . Lung cancer Paternal Grandfather   . Colon cancer Paternal Grandmother   . Breast cancer Neg Hx     Social History:  Social History   Socioeconomic History  . Marital status: Married    Spouse name: Not on file  . Number of children: Not  on file  . Years of education: Not on file  . Highest education level: Not on file  Occupational History  . Not on file  Tobacco Use  . Smoking status: Former Smoker    Packs/day: 0.50    Years: 10.00    Pack years: 5.00    Types: Cigarettes  . Smokeless tobacco: Never Used  Substance and Sexual Activity  . Alcohol use: Never    Alcohol/week: 0.0 standard drinks    Comment: Occasionally drinks wine  . Drug use: No  . Sexual activity: Yes    Birth control/protection: None  Other Topics Concern  . Not on file  Social History Narrative  . Not on file   Social Determinants of Health   Financial Resource Strain:   . Difficulty of Paying Living Expenses:   Food Insecurity:   . Worried About Charity fundraiser in the Last Year:   . Arboriculturist in the Last Year:   Transportation Needs:   . Film/video editor (Medical):   Marland Kitchen Lack of Transportation (Non-Medical):   Physical Activity:   . Days of Exercise per Week:   . Minutes of Exercise per Session:   Stress:   . Feeling of Stress :   Social Connections:   . Frequency of Communication with Friends and Family:   . Frequency of Social Gatherings with Friends and Family:   . Attends Religious Services:   . Active Member of Clubs or Organizations:   .  Attends Archivist Meetings:   Marland Kitchen Marital Status:   Intimate Partner Violence:   . Fear of Current or Ex-Partner:   . Emotionally Abused:   Marland Kitchen Physically Abused:   . Sexually Abused:     Allergies:  Allergies  Allergen Reactions  . Sulfa Antibiotics     Medications: Prior to Admission medications   Not on File    Physical Exam Vitals: Blood pressure 120/76, height 5\' 9"  (1.753 m), weight 146 lb 9.6 oz (66.5 kg), last menstrual period 08/28/2019.  General: NAD HEENT: normocephalic, anicteric Thyroid: no enlargement, no palpable nodules Pulmonary: No increased work of breathing, CTAB Cardiovascular: RRR, distal pulses 2+ Breast: Breast  symmetrical, no tenderness, no palpable nodules or masses, no skin or nipple retraction present, no nipple discharge.  No axillary or supraclavicular lymphadenopathy. Abdomen: NABS, soft, non-tender, non-distended.  Umbilicus without lesions.  No hepatomegaly, splenomegaly or masses palpable. No evidence of hernia  Genitourinary:  External: Normal external female genitalia.  Normal urethral meatus, normal Bartholin's and Skene's glands.    Vagina: Normal vaginal mucosa, no evidence of prolapse.    Cervix: Grossly normal in appearance, no bleeding  Uterus: Non-enlarged, mobile, normal contour.  No CMT  Adnexa: ovaries non-enlarged, no adnexal masses  Rectal: deferred  Lymphatic: no evidence of inguinal lymphadenopathy Extremities: no edema, erythema, or tenderness Neurologic: Grossly intact Psychiatric: mood appropriate, affect full  Female chaperone present for pelvic and breast  portions of the physical exam  PROCEDURE:  Cervical polyp visible at cervical os.  Cervical polyp grasped with [place] twisted and removed in entirety.  Specimen sent to pathology.  Scant bleeding after removal of polyp.  Assessment: 44 y.o. G1P1001 routine annual exam  Plan: Problem List Items Addressed This Visit    None    Visit Diagnoses    Cervical polyp    -  Primary   Relevant Orders   Surgical pathology   Encounter for annual routine gynecological examination       Health maintenance examination       Breast cancer screening by mammogram       Relevant Orders   MM 3D SCREEN BREAST BILATERAL   Screening for thyroid disorder       Relevant Orders   TSH + free T4   Acute vaginitis       Relevant Orders   NuSwab Vaginitis Plus (VG+)   Bleeding after intercourse          1) Mammogram - recommend yearly screening mammogram.  Mammogram Was ordered today  2) STI screening  wasoffered and accepted  3) ASCCP guidelines and rational discussed.  Patient opts for every 5 years screening  interval  4) Routine healthcare maintenance including cholesterol, diabetes screening discussed managed by PCP  5) Bleeding after intercourse.- cervical polyp removed today on exam. If this does not improve symptoms patient to return for SIS.   6) Return in about 1 year (around 09/03/2020) for annual.   [facility], [facility] 09/04/2019, 10:45 AM

## 2019-09-04 NOTE — Patient Instructions (Signed)
Institute of [place] for Calcium and Vitamin D  Age (yr) Calcium Recommended Dietary Allowance (mg/day) Vitamin D Recommended Dietary Allowance (international units/day)  9-18 1,300 600  19-50 1,000 600  51-70 1,200 600  71 and older 1,200 800  Data from [employer] of [employer]. Dietary reference intakes: calcium, vitamin D. [place], [place]: [employer]; 2011.     Budget-Friendly Healthy Eating There are many ways to save money at the grocery store and continue to eat healthy. You can be successful if you:  Plan meals according to your budget.  Make a grocery list and only purchase food according to your grocery list.  Prepare food yourself. What are tips for following this plan?  Reading food labels  Compare food labels between brand name foods and the store brand. Often the nutritional value is the same, but the store brand is lower cost.  Look for products that do not have added sugar, fat, or salt (sodium). These often cost the same but are healthier for you. Products may be labeled as: ? Sugar-free. ? Nonfat. ? Low-fat. ? Sodium-free. ? Low-sodium.  Look for lean ground beef labeled as at least 92% lean and 8% fat. Shopping  Buy only the items on your grocery list and go only to the areas of the store that have the items on your list.  Use coupons only for foods and brands you normally buy. Avoid buying items you wouldn't normally buy simply because they are on sale.  Check online and in newspapers for weekly deals.  Buy healthy items from the bulk bins when available, such as herbs, spices, flour, pasta, nuts, and dried fruit.  Buy fruits and vegetables that are in season. Prices are usually lower on in-season produce.  Look at the unit price on the price tag. Use it to compare different brands and sizes to find out which item is the best deal.  Choose healthy items that are often low-cost, such as carrots,  potatoes, apples, bananas, and oranges. Dried or canned beans are a low-cost protein source.  Buy in bulk and freeze extra food. Items you can buy in bulk include meats, fish, poultry, frozen fruits, and frozen vegetables.  Avoid buying "ready-to-eat" foods, such as pre-cut fruits and vegetables and pre-made salads.  If possible, shop around to discover where you can find the best prices. Consider other retailers such as dollar stores, larger [employer], local fruit and vegetable stands, and farmers markets.  Do not shop when you are hungry. If you shop while hungry, it may be hard to stick to your list and budget.  Resist impulse buying. Use your grocery list as your official plan for the week.  Buy a variety of vegetables and fruits by purchasing fresh, frozen, and canned items.  Look at the top and bottom shelves for deals. Foods at eye level (eye level of an adult or child) are usually more expensive.  Be efficient with your time when shopping. The more time you spend at the store, the more money you are likely to spend.  To save money when choosing more expensive foods like meats and dairy: ? Choose cheaper cuts of meat, such as bone-in chicken thighs and drumsticks instead of skinless and boneless chicken. When you are ready to prepare the chicken, you can remove the skin yourself to make it healthier. ? Choose lean meats like chicken or [place] instead of beef. ? Choose canned seafood, such as tuna, salmon, or sardines. ? Buy  as a low-cost source of protein. ? Buy dried beans and peas, such as lentils, split peas, or kidney beans instead of meats. Dried beans and peas are a good alternative source of protein. ? Buy the larger tubs of yogurt instead of individual-sized containers.  Choose water instead of sodas and other sweetened beverages.  Avoid buying chips, cookies, and other "junk food." These items are usually expensive and not healthy. Cooking  Make extra food  and freeze the extras in meal-sized containers or in individual portions for fast meals and snacks.  Pre-cook on days when you have extra time to prepare meals in advance. You can keep these meals in the fridge or freezer and reheat for a quick meal.  When you come home from the grocery store, wash, peel, and cut fruits and vegetables so they are ready to use and eat. This will help reduce food waste. Meal planning  Do not eat out or get fast food. Prepare food at home.  Make a grocery list and make sure to bring it with you to the store. If you have a smart phone, you could use your phone to create your shopping list.  Plan meals and snacks according to a grocery list and budget you create.  Use leftovers in your meal plan for the week.  Look for recipes where you can cook once and make enough food for two meals.  Include budget-friendly meals like stews, casseroles, and stir-fry dishes.  Try some meatless meals or try "no cook" meals like salads.  Make sure that half your plate is filled with fruits or vegetables. Choose from fresh, frozen, or canned fruits and vegetables. If eating canned, remember to rinse them before eating. This will remove any excess salt added for packaging. Summary  Eating healthy on a budget is possible if you plan your meals according to your budget, purchase according to your budget and grocery list, and prepare food yourself.  Tips for buying more food on a limited budget include buying generic brands, using coupons only for foods you normally buy, and buying healthy items from the bulk bins when available.  Tips for buying cheaper food to replace expensive food include choosing cheaper, lean cuts of meat, and buying dried beans and peas. This information is not intended to replace advice given to you by your health care provider. Make sure you discuss any questions you have with your health care provider. Document Revised: 03/22/2017 Document Reviewed:  03/22/2017 Elsevier Patient Education  2020 Else[employer] Inc.   Exercising to Stay Healthy To become healthy and stay healthy, it is recommended that you do moderate-intensity and vigorous-intensity exercise. You can tell that you are exercising at a moderate intensity if your heart starts beating faster and you start breathing faster but can still hold a conversation. You can tell that you are exercising at a vigorous intensity if you are breathing much harder and faster and cannot hold a conversation while exercising. Exercising regularly is important. It has many health benefits, such as:  Improving overall fitness, flexibility, and endurance.  Increasing bone density.  Helping with weight control.  Decreasing body fat.  Increasing muscle strength.  Reducing stress and tension.  Improving overall health. How often should I exercise? Choose an activity that you enjoy, and set realistic goals. Your health care provider can help you make an activity plan that works for you. Exercise regularly as told by your health care provider. This may include:  Doing strength training two times a   week, such as: ? Lifting weights. ? Using resistance bands. ? Push-ups. ? Sit-ups. ? Yoga.  Doing a certain intensity of exercise for a given amount of time. Choose from these options: ? A total of 150 minutes of moderate-intensity exercise every week. ? A total of 75 minutes of vigorous-intensity exercise every week. ? A mix of moderate-intensity and vigorous-intensity exercise every week. Children, pregnant women, people who have not exercised regularly, people who are overweight, and older adults may need to talk with a health care provider about what activities are safe to do. If you have a medical condition, be sure to talk with your health care provider before you start a new exercise program. What are some exercise ideas? Moderate-intensity exercise ideas include:  Walking 1 mile (1.6 km) in  about 15 minutes.  Biking.  Hiking.  Golfing.  Dancing.  Water aerobics. Vigorous-intensity exercise ideas include:  Walking 4.5 miles (7.2 km) or more in about 1 hour.  Jogging or running 5 miles (8 km) in about 1 hour.  Biking 10 miles (16.1 km) or more in about 1 hour.  Lap swimming.  Roller-skating or in-line skating.  Cross-country skiing.  Vigorous competitive sports, such as football, basketball, and soccer.  Jumping rope.  Aerobic dancing. What are some everyday activities that can help me to get exercise?  Yard work, such as: ? Pushing a lawn mower. ? Raking and bagging leaves.  Washing your car.  Pushing a stroller.  Shoveling snow.  Gardening.  Washing windows or floors. How can I be more active in my day-to-day activities?  Use stairs instead of an elevator.  Take a walk during your lunch break.  If you drive, park your car farther away from your work or school.  If you take public transportation, get off one stop early and walk the rest of the way.  Stand up or walk around during all of your indoor phone calls.  Get up, stretch, and walk around every 30 minutes throughout the day.  Enjoy exercise with a friend. Support to continue exercising will help you keep a regular routine of activity. What guidelines can I follow while exercising?  Before you start a new exercise program, talk with your health care provider.  Do not exercise so much that you hurt yourself, feel dizzy, or get very short of breath.  Wear comfortable clothes and wear shoes with good support.  Drink plenty of water while you exercise to prevent dehydration or heat stroke.  Work out until your breathing and your heartbeat get faster. Where to find more information  U.S. Department of Health and Human Services: www.hhs.gov  Centers for Disease Control and Prevention (CDC): www.cdc.gov Summary  Exercising regularly is important. It will improve your overall  fitness, flexibility, and endurance.  Regular exercise also will improve your overall health. It can help you control your weight, reduce stress, and improve your bone density.  Do not exercise so much that you hurt yourself, feel dizzy, or get very short of breath.  Before you start a new exercise program, talk with your health care provider. This information is not intended to replace advice given to you by your health care provider. Make sure you discuss any questions you have with your health care provider. Document Revised: 03/03/2017 Document Reviewed: 02/09/2017 Elsevier Patient Education  2020 Elsevier Inc.   Bone Health Bones protect organs, store calcium, anchor muscles, and support the whole body. Keeping your bones strong is important, especially as you get   older. You can take actions to help keep your bones strong and healthy. Why is keeping my bones healthy important?  Keeping your bones healthy is important because your body constantly replaces bone cells. Cells get old, and new cells take their place. As we age, we lose bone cells because the body may not be able to make enough new cells to replace the old cells. The amount of bone cells and bone tissue you have is referred to as bone mass. The higher your bone mass, the stronger your bones. The aging process leads to an overall loss of bone mass in the body, which can increase the likelihood of:  Joint pain and stiffness.  Broken bones.  A condition in which the bones become weak and brittle (osteoporosis). A large decline in bone mass occurs in older adults. In women, it occurs about the time of menopause. What actions can I take to keep my bones healthy? Good health habits are important for maintaining healthy bones. This includes eating nutritious foods and exercising regularly. To have healthy bones, you need to get enough of the right minerals and vitamins. Most nutrition experts recommend getting these nutrients from  the foods that you eat. In some cases, taking supplements may also be recommended. Doing certain types of exercise is also important for bone health. What are the nutritional recommendations for healthy bones?  Eating a well-balanced diet with plenty of calcium and vitamin D will help to protect your bones. Nutritional recommendations vary from person to person. Ask your health care provider what is healthy for you. Here are some general guidelines. Get enough calcium Calcium is the most important (essential) mineral for bone health. Most people can get enough calcium from their diet, but supplements may be recommended for people who are at risk for osteoporosis. Good sources of calcium include:  Dairy products, such as low-fat or nonfat milk, cheese, and yogurt.  Dark green leafy vegetables, such as bok choy and broccoli.  Calcium-fortified foods, such as orange juice, cereal, bread, soy beverages, and tofu products.  Nuts, such as almonds. Follow these recommended amounts for daily calcium intake:  Children, age 1-3: 700 mg.  Children, age 4-8: 1,000 mg.  Children, age 9-13: 1,300 mg.  Teens, age 14-18: 1,300 mg.  Adults, age 19-50: 1,000 mg.  Adults, age 51-70: ? Men: 1,000 mg. ? Women: 1,200 mg.  Adults, age 71 or older: 1,200 mg.  Pregnant and breastfeeding females: ? Teens: 1,300 mg. ? Adults: 1,000 mg. Get enough vitamin D Vitamin D is the most essential vitamin for bone health. It helps the body absorb calcium. Sunlight stimulates the skin to make vitamin D, so be sure to get enough sunlight. If you live in a cold climate or you do not get outside often, your health care provider may recommend that you take vitamin D supplements. Good sources of vitamin D in your diet include:  Egg yolks.  Saltwater fish.  Milk and cereal fortified with vitamin D. Follow these recommended amounts for daily vitamin D intake:  Children and teens, age 1-18: 600 international  units.  Adults, age 50 or younger: 400-800 international units.  Adults, age 51 or older: 800-1,000 international units. Get other important nutrients Other nutrients that are important for bone health include:  Phosphorus. This mineral is found in meat, poultry, dairy foods, nuts, and legumes. The recommended daily intake for adult men and adult women is 700 mg.  Magnesium. This mineral is found in seeds, nuts, dark green   green vegetables, and legumes. The recommended daily intake for adult men is 400-420 mg. For adult women, it is 310-320 mg.  Vitamin K. This vitamin is found in green leafy vegetables. The recommended daily intake is 120 mg for adult men and 90 mg for adult women. What type of physical activity is best for building and maintaining healthy bones? Weight-bearing and strength-building activities are important for building and maintaining healthy bones. Weight-bearing activities cause muscles and bones to work against gravity. Strength-building activities increase the strength of the muscles that support bones. Weight-bearing and muscle-building activities include:  Walking and hiking.  Jogging and running.  Dancing.  Gym exercises.  Lifting weights.  Tennis and racquetball.  Climbing stairs.  Aerobics. Adults should get at least 30 minutes of moderate physical activity on most days. Children should get at least 60 minutes of moderate physical activity on most days. Ask your health care provider what type of exercise is best for you. How can I find out if my bone mass is low? Bone mass can be measured with an X-ray test called a bone mineral density (BMD) test. This test is recommended for all women who are age 32 or older. It may also be recommended for:  Men who are age 21 or older.  People who are at risk for osteoporosis because of: ? Having bones that break easily. ? Having a long-term disease that weakens bones, such as kidney disease or rheumatoid  arthritis. ? Having menopause earlier than normal. ? Taking medicine that weakens bones, such as steroids, thyroid hormones, or hormone treatment for breast cancer or prostate cancer. ? Smoking. ? Drinking three or more alcoholic drinks a day. If you find that you have a low bone mass, you may be able to prevent osteoporosis or further bone loss by changing your diet and lifestyle. Where can I find more information? For more information, check out the following websites:  [place]: [employer]  [employer] of Health: www.bones.SouthExposed.es  International Osteoporosis Foundation: Administrator.iofbonehealth.org Summary  The aging process leads to an overall loss of bone mass in the body, which can increase the likelihood of broken bones and osteoporosis.  Eating a well-balanced diet with plenty of calcium and vitamin D will help to protect your bones.  Weight-bearing and strength-building activities are also important for building and maintaining strong bones.  Bone mass can be measured with an X-ray test called a bone mineral density (BMD) test. This information is not intended to replace advice given to you by your health care provider. Make sure you discuss any questions you have with your health care provider. Document Revised: 04/17/2017 Document Reviewed: 04/17/2017 Elsevier Patient Education  2020 [employer].

## 2019-09-05 LAB — SURGICAL PATHOLOGY

## 2019-09-05 LAB — TSH+FREE T4
Free T4: 1.45 ng/dL (ref 0.82–1.77)
TSH: 0.978 u[IU]/mL (ref 0.450–4.500)

## 2019-09-06 LAB — NUSWAB VAGINITIS PLUS (VG+)
Candida albicans, NAA: NEGATIVE
Candida glabrata, NAA: NEGATIVE
Chlamydia trachomatis, NAA: NEGATIVE
Neisseria gonorrhoeae, NAA: NEGATIVE
Trich vag by NAA: NEGATIVE

## 2019-10-14 ENCOUNTER — Ambulatory Visit (INDEPENDENT_AMBULATORY_CARE_PROVIDER_SITE_OTHER): Payer: Self-pay

## 2019-10-14 ENCOUNTER — Other Ambulatory Visit: Payer: Self-pay

## 2019-10-14 DIAGNOSIS — L680 Hirsutism: Secondary | ICD-10-CM

## 2019-12-02 ENCOUNTER — Ambulatory Visit: Payer: BC Managed Care – PPO

## 2020-02-10 ENCOUNTER — Ambulatory Visit: Payer: BC Managed Care – PPO

## 2020-07-16 DIAGNOSIS — M7742 Metatarsalgia, left foot: Secondary | ICD-10-CM | POA: Diagnosis not present

## 2020-09-02 DIAGNOSIS — C50912 Malignant neoplasm of unspecified site of left female breast: Secondary | ICD-10-CM

## 2020-09-02 HISTORY — DX: Malignant neoplasm of unspecified site of left female breast: C50.912

## 2020-11-04 ENCOUNTER — Other Ambulatory Visit: Payer: Self-pay | Admitting: Obstetrics and Gynecology

## 2020-11-04 ENCOUNTER — Other Ambulatory Visit (HOSPITAL_COMMUNITY)
Admission: RE | Admit: 2020-11-04 | Discharge: 2020-11-04 | Disposition: A | Discharge status: Home / Self Care | Payer: BC Managed Care – PPO | Source: Ambulatory Visit | Attending: Obstetrics and Gynecology | Admitting: Obstetrics and Gynecology

## 2020-11-04 ENCOUNTER — Other Ambulatory Visit: Payer: Self-pay

## 2020-11-04 ENCOUNTER — Ambulatory Visit (INDEPENDENT_AMBULATORY_CARE_PROVIDER_SITE_OTHER): Payer: BC Managed Care – PPO | Admitting: Obstetrics and Gynecology

## 2020-11-04 ENCOUNTER — Encounter: Payer: Self-pay | Admitting: Obstetrics and Gynecology

## 2020-11-04 VITALS — BP 122/70 | Ht 69.0 in | Wt 155.4 lb

## 2020-11-04 DIAGNOSIS — Z0000 Encounter for general adult medical examination without abnormal findings: Secondary | ICD-10-CM

## 2020-11-04 DIAGNOSIS — Z01419 Encounter for gynecological examination (general) (routine) without abnormal findings: Secondary | ICD-10-CM | POA: Diagnosis not present

## 2020-11-04 DIAGNOSIS — Z113 Encounter for screening for infections with a predominantly sexual mode of transmission: Secondary | ICD-10-CM | POA: Diagnosis not present

## 2020-11-04 DIAGNOSIS — Z124 Encounter for screening for malignant neoplasm of cervix: Secondary | ICD-10-CM

## 2020-11-04 DIAGNOSIS — Z1211 Encounter for screening for malignant neoplasm of colon: Secondary | ICD-10-CM | POA: Diagnosis not present

## 2020-11-04 DIAGNOSIS — Z1231 Encounter for screening mammogram for malignant neoplasm of breast: Secondary | ICD-10-CM

## 2020-11-04 NOTE — Patient Instructions (Signed)
Exercising to Stay Healthy To become healthy and stay healthy, it is recommended that you do moderate-intensity and vigorous-intensity exercise. You can tell that you are exercising at a moderate intensity if your heart starts beating faster and you start breathing faster but can still hold a conversation. You can tell that you are exercising at a vigorous intensity if you are breathing much harder andfaster and cannot hold a conversation while exercising. Exercising regularly is important. It has many health benefits, such as: Improving overall fitness, flexibility, and endurance. Increasing bone density. Helping with weight control. Decreasing body fat. Increasing muscle strength. Reducing stress and tension. Improving overall health. How often should I exercise? Choose an activity that you enjoy, and set realistic goals. Your health careprovider can help you make an activity plan that works for you. Exercise regularly as told by your health care provider. This may include: Doing strength training two times a week, such as: Lifting weights. Using resistance bands. Push-ups. Sit-ups. Yoga. Doing a certain intensity of exercise for a given amount of time. Choose from these options: A total of 150 minutes of moderate-intensity exercise every week. A total of 75 minutes of vigorous-intensity exercise every week. A mix of moderate-intensity and vigorous-intensity exercise every week. Children, pregnant women, people who have not exercised regularly, people who are overweight, and older adults may need to talk with a health care provider about what activities are safe to do. If you have a medical condition, be sureto talk with your health care provider before you start a new exercise program. What are some exercise ideas? Moderate-intensity exercise ideas include: Walking 1 mile (1.6 km) in about 15 minutes. Biking. Hiking. Golfing. Dancing. Water aerobics. Vigorous-intensity exercise  ideas include: Walking 4.5 miles (7.2 km) or more in about 1 hour. Jogging or running 5 miles (8 km) in about 1 hour. Biking 10 miles (16.1 km) or more in about 1 hour. Lap swimming. Roller-skating or in-line skating. Cross-country skiing. Vigorous competitive sports, such as football, basketball, and soccer. Jumping rope. Aerobic dancing. What are some everyday activities that can help me to get exercise? Yard work, such as: Pushing a lawn mower. Raking and bagging leaves. Washing your car. Pushing a stroller. Shoveling snow. Gardening. Washing windows or floors. How can I be more active in my day-to-day activities? Use stairs instead of an elevator. Take a walk during your lunch break. If you drive, park your car farther away from your work or school. If you take public transportation, get off one stop early and walk the rest of the way. Stand up or walk around during all of your indoor phone calls. Get up, stretch, and walk around every 30 minutes throughout the day. Enjoy exercise with a friend. Support to continue exercising will help you keep a regular routine of activity. What guidelines can I follow while exercising? Before you start a new exercise program, talk with your health care provider. Do not exercise so much that you hurt yourself, feel dizzy, or get very short of breath. Wear comfortable clothes and wear shoes with good support. Drink plenty of water while you exercise to prevent dehydration or heat stroke. Work out until your breathing and your heartbeat get faster. Where to find more information [place] Department of Health and Human Services: www.hhs.gov Centers for Disease Control and Prevention (CDC): www.cdc.gov Summary Exercising regularly is important. It will improve your overall fitness, flexibility, and endurance. Regular exercise also will improve your overall health. It can help you control your weight,   reduce stress, and improve your bone  density. Do not exercise so much that you hurt yourself, feel dizzy, or get very short of breath. Before you start a new exercise program, talk with your health care provider. This information is not intended to replace advice given to you by your health care provider. Make sure you discuss any questions you have with your healthcare provider. Document Revised: 03/06/2020 Document Reviewed: 03/06/2020 Elsevier Patient Education  2022 Elsevier Inc. Budget-Friendly Healthy Eating There are many ways to save money at the grocery store and continue to eat healthy. You can be successful if you: Plan meals according to your budget. Make a grocery list and only purchase food according to your grocery list. Prepare food yourself at home. What are tips for following this plan? Reading food labels Compare food labels between brand name foods and the store brand. Often the nutritional value is the same, but the store brand is lower cost. Look for products that do not have added sugar, fat, or salt (sodium). These often cost the same but are healthier for you. Products may be labeled as: Sugar-free. Nonfat. Low-fat. Sodium-free. Low-sodium. Look for lean ground beef labeled as at least 92% lean and 8% fat. Shopping  Buy only the items on your grocery list and go only to the areas of the store that have the items on your list. Use coupons only for foods and brands you normally buy. Avoid buying items you wouldn't normally buy simply because they are on sale. Check online and in newspapers for weekly deals. Buy healthy items from the bulk bins when available, such as herbs, spices, flour, pasta, nuts, and dried fruit. Buy fruits and vegetables that are in season. Prices are usually lower on in-season produce. Look at the unit price on the price tag. Use it to compare different brands and sizes to find out which item is the best deal. Choose healthy items that are often low-cost, such as carrots,  potatoes, apples, bananas, and oranges. Dried or canned beans are a low-cost protein source. Buy in bulk and freeze extra food. Items you can buy in bulk include meats, fish, poultry, frozen fruits, and frozen vegetables. Avoid buying "ready-to-eat" foods, such as pre-cut fruits and vegetables and pre-made salads. If possible, shop around to discover where you can find the best prices. Consider other retailers such as dollar stores, larger wholesale stores, local fruit and vegetable stands, and farmers markets. Do not shop when you are hungry. If you shop while hungry, it may be hard to stick to your list and budget. Resist impulse buying. Use your grocery list as your official plan for the week. Buy a variety of vegetables and fruits by purchasing fresh, frozen, and canned items. Look at the top and bottom shelves for deals. Foods at eye level (eye level of an adult or child) are usually more expensive. Be efficient with your time when shopping. The more time you spend at the store, the more money you are likely to spend. To save money when choosing more expensive foods like meats and dairy: Choose cheaper cuts of meat, such as bone-in chicken thighs and drumsticks instead of skinless and boneless chicken. When you are ready to prepare the chicken, you can remove the skin yourself to make it healthier. Choose lean meats like chicken or turkey instead of beef. Choose canned seafood, such as tuna, salmon, or sardines. Buy eggs as a low-cost source of protein. Buy dried beans and peas, such as lentils, split   peas, or kidney beans instead of meats. Dried beans and peas are a good alternative source of protein. Buy the larger tubs of yogurt instead of individual-sized containers. Choose water instead of sodas and other sweetened beverages. Avoid buying chips, cookies, and other "junk food." These items are usually expensive and not healthy.  Cooking Make extra food and freeze the extras in meal-sized  containers or in individual portions for fast meals and snacks. Pre-cook on days when you have extra time to prepare meals in advance. You can keep these meals in the fridge or freezer and reheat for a quick meal. When you come home from the grocery store, wash, peel, and cut fruits and vegetables so they are ready to use and eat. This will help reduce food waste. Meal planning Do not eat out or get fast food. Prepare food at home. Make a grocery list and make sure to bring it with you to the store. If you have a smart phone, you could use your phone to create your shopping list. Plan meals and snacks according to a grocery list and budget you create. Use leftovers in your meal plan for the week. Look for recipes where you can cook once and make enough food for two meals. Prepare budget-friendly types of meals like stews, casseroles, and stir-fry dishes. Try some meatless meals or try "no cook" meals like salads. Make sure that half your plate is filled with fruits or vegetables. Choose from fresh, frozen, or canned fruits and vegetables. If eating canned, remember to rinse them before eating. This will remove any excess salt added for packaging. Summary Eating healthy on a budget is possible if you plan your meals according to your budget, purchase according to your budget and grocery list, and prepare food yourself. Tips for buying more food on a limited budget include buying generic brands, using coupons only for foods you normally buy, and buying healthy items from the bulk bins when available. Tips for buying cheaper food to replace expensive food include choosing cheaper, lean cuts of meat, and buying dried beans and peas. This information is not intended to replace advice given to you by your health care provider. Make sure you discuss any questions you have with your healthcare provider. Document Revised: 01/02/2020 Document Reviewed: 01/02/2020 Elsevier Patient Education  2022 Elsevier  Inc. Bone Health Bones protect organs, store calcium, anchor muscles, and support the whole body. Keeping your bones strong is important, especially as you get older. Youcan take actions to help keep your bones strong and healthy. Why is keeping my bones healthy important?  Keeping your bones healthy is important because your body constantly replaces bone cells. Cells get old, and new cells take their place. As we age, we lose bone cells because the body may not be able to make enough new cells to replace the old cells. The amount of bone cells and bone tissue you have is referred toas bone mass. The higher your bone mass, the stronger your bones. The aging process leads to an overall loss of bone mass in the body, which can increase the likelihood of: Joint pain and stiffness. Broken bones. A condition in which the bones become weak and brittle (osteoporosis). A large decline in bone mass occurs in older adults. In women, it occurs aboutthe time of menopause. What actions can I take to keep my bones healthy? Good health habits are important for maintaining healthy bones. This includes eating nutritious foods and exercising regularly. To have healthy bones,   you need to get enough of the right minerals and vitamins. Most nutrition experts recommend getting these nutrients from the foods that you eat. In some cases, taking supplements may also be recommended. Doing certain types of exercise isalso important for bone health. What are the nutritional recommendations for healthy bones?  Eating a well-balanced diet with plenty of calcium and vitamin D will help to protect your bones. Nutritional recommendations vary from person to person. Ask your health care provider what is healthy for you. Here are some generalguidelines. Get enough calcium Calcium is the most important (essential) mineral for bone health. Most people can get enough calcium from their diet, but supplements may be recommended for people  who are at risk for osteoporosis. Good sources of calcium include: Dairy products, such as low-fat or nonfat milk, cheese, and yogurt. Dark green leafy vegetables, such as bok choy and broccoli. Calcium-fortified foods, such as orange juice, cereal, bread, soy beverages, and tofu products. Nuts, such as almonds. Follow these recommended amounts for daily calcium intake: Children, age 1-3: 700 mg. Children, age 4-8: 1,000 mg. Children, age 9-13: 1,300 mg. Teens, age 14-18: 1,300 mg. Adults, age 19-50: 1,000 mg. Adults, age 51-70: Men: 1,000 mg. Women: 1,200 mg. Adults, age 71 or older: 1,200 mg. Pregnant and breastfeeding females: Teens: 1,300 mg. Adults: 1,000 mg. Get enough vitamin D Vitamin D is the most essential vitamin for bone health. It helps the body absorb calcium. Sunlight stimulates the skin to make vitamin D, so be sure to get enough sunlight. If you live in a cold climate or you do not get outside often, your health care provider may recommend that you take vitamin D supplements. Good sources of vitamin D in your diet include: Egg yolks. Saltwater fish. Milk and cereal fortified with vitamin D. Follow these recommended amounts for daily vitamin D intake: Children and teens, age 1-18: 600 international units. Adults, age 50 or younger: 400-800 international units. Adults, age 51 or older: 800-1,000 international units. Get other important nutrients Other nutrients that are important for bone health include: Phosphorus. This mineral is found in meat, poultry, dairy foods, nuts, and legumes. The recommended daily intake for adult men and adult women is 700 mg. Magnesium. This mineral is found in seeds, nuts, dark green vegetables, and legumes. The recommended daily intake for adult men is 400-420 mg. For adult women, it is 310-320 mg. Vitamin K. This vitamin is found in green leafy vegetables. The recommended daily intake is 120 mg for adult men and 90 mg for adult  women. What type of physical activity is best for building and maintaining healthybones? Weight-bearing and strength-building activities are important for building and maintaining healthy bones. Weight-bearing activities cause muscles and bones to work against gravity. Strength-building activities increase the strength of the muscles that support bones. Weight-bearing and muscle-building activities include: Walking and hiking. Jogging and running. Dancing. Gym exercises. Lifting weights. Tennis and racquetball. Climbing stairs. Aerobics. Adults should get at least 30 minutes of moderate physical activity on most days. Children should get at least 60 minutes of moderate physical activity onmost days. Ask your health care provider what type of exercise is best for you. How can I find out if my bone mass is low? Bone mass can be measured with an X-ray test called a bone mineral density (BMD) test. This test is recommended for all women who are age 65 or older. It may also be recommended for: Men who are age 70 or older. People who   are at risk for osteoporosis because of: Having bones that break easily. Having a long-term disease that weakens bones, such as kidney disease or rheumatoid arthritis. Having menopause earlier than normal. Taking medicine that weakens bones, such as steroids, thyroid hormones, or hormone treatment for breast cancer or prostate cancer. Smoking. Drinking three or more alcoholic drinks a day. If you find that you have a low bone mass, you may be able to preventosteoporosis or further bone loss by changing your diet and lifestyle. Where can I find more information? For more information, check out the following websites: National Osteoporosis Foundation: www.nof.org/patients National Institutes of Health: www.bones.nih.gov International Osteoporosis Foundation: www.iofbonehealth.org Summary The aging process leads to an overall loss of bone mass in the body, which can  increase the likelihood of broken bones and osteoporosis. Eating a well-balanced diet with plenty of calcium and vitamin D will help to protect your bones. Weight-bearing and strength-building activities are also important for building and maintaining strong bones. Bone mass can be measured with an X-ray test called a bone mineral density (BMD) test. This information is not intended to replace advice given to you by your health care provider. Make sure you discuss any questions you have with your healthcare provider. Document Revised: 04/17/2017 Document Reviewed: 04/17/2017 Elsevier Patient Education  2022 Elsevier Inc.  

## 2020-11-04 NOTE — Progress Notes (Signed)
Gynecology Annual Exam  PCP: Margo Common, PA-C  Chief Complaint:  Chief Complaint  Patient presents with   Gynecologic Exam    History of Present Illness: Patient is a 45 y.o. G1P1001 presents for annual exam. The patient has no complaints today.   LMP: Patient's last menstrual period was 10/12/2020. Average Interval: regular,  26-28  days Duration of flow:  3-4  days  Heavy Menses: no Dysmenorrhea: no  She denies passage of large clots She denies sensations of gushing or flooding of blood. She denies accidents where she bleeds through her clothing. She denies that she changes a saturated pad or tampon more frequently than every hour.  She denies that pain from her periods limits her activities.  The patient does perform self breast exams.  There is no notable family history of breast or ovarian cancer in her family.  The patient has regular exercise: yes crossfit 6 days a week  The patient denies current symptoms of depression.   PHQ-9: 0 GAD-7: 0   Review of Systems: Review of Systems  Constitutional:  Negative for chills, fever, malaise/fatigue and weight loss.  HENT:  Negative for congestion, hearing loss and sinus pain.   Eyes:  Negative for blurred vision and double vision.  Respiratory:  Negative for cough, sputum production, shortness of breath and wheezing.   Cardiovascular:  Negative for chest pain, palpitations, orthopnea and leg swelling.  Gastrointestinal:  Negative for abdominal pain, constipation, diarrhea, nausea and vomiting.  Genitourinary:  Negative for dysuria, flank pain, frequency, hematuria and urgency.  Musculoskeletal:  Negative for back pain, falls and joint pain.  Skin:  Negative for itching and rash.  Neurological:  Negative for dizziness and headaches.  Psychiatric/Behavioral:  Negative for depression, substance abuse and suicidal ideas. The patient is not nervous/anxious.    Past Medical History:  Past Medical History:   Diagnosis Date   Basal cell carcinoma 09/21/2015   Left anterior shoulder. Superficial and nodular patterns.   Basal cell carcinoma 09/28/2015   Left spinal upper back. Superficial.   Basal cell carcinoma 12/06/2016   Right forearm. Superficial.    Dysplastic nevus 12/06/2016   Right spinal mid back. Severe atypia and halo nevus effect, margin involved. Excised: 02/13/2017. Margins free   Dysplastic nevus 07/04/2017   Right upper back. Moderate atypia, close to margin.   Fibrocystic breast    Stress incontinence    Umbilical hernia 0000000    Past Surgical History:  Past Surgical History:  Procedure Laterality Date   FOOT SURGERY Right 07/2010    Gynecologic History:  Patient's last menstrual period was 10/12/2020. Menarche: 16  History of fibroids, polyps, or ovarian cysts? : no  History of PCOS? no Hstory of Endometriosis? no History of abnormal pap smears? no Have you had any sexually transmitted infections in the past?  no  Last Pap: Results were: 2019 NIL  She identifies as a female. She is sexually active with men.   She denies dyspareunia.   Obstetric History: G1P1001  Family History:  Family History  Problem Relation Age of Onset   Healthy Mother    Hyperlipidemia Father    Healthy Brother    Healthy Son    Healthy Maternal Grandmother    Emphysema Maternal Grandfather    Lung cancer Paternal Grandfather    Colon cancer Paternal Grandmother    Breast cancer Neg Hx     Social History:  Social History   Socioeconomic History   Marital status: Married  Spouse name: Not on file   Number of children: Not on file   Years of education: Not on file   Highest education level: Not on file  Occupational History   Not on file  Tobacco Use   Smoking status: Former    Packs/day: 0.50    Years: 10.00    Pack years: 5.00    Types: Cigarettes   Smokeless tobacco: Never  Vaping Use   Vaping Use: Never used  Substance and Sexual Activity   Alcohol use:  Never    Alcohol/week: 0.0 standard drinks    Comment: Occasionally drinks wine   Drug use: No   Sexual activity: Yes    Birth control/protection: None  Other Topics Concern   Not on file  Social History Narrative   Not on file   Social Determinants of Health   Financial Resource Strain: Not on file  Food Insecurity: Not on file  Transportation Needs: Not on file  Physical Activity: Not on file  Stress: Not on file  Social Connections: Not on file  Intimate Partner Violence: Not on file    Allergies:  Allergies  Allergen Reactions   Sulfa Antibiotics     Medications: Prior to Admission medications   Not on File    Physical Exam Vitals: Blood pressure 122/70, height '5\' 9"'$  (1.753 m), weight 155 lb 6.4 oz (70.5 kg), last menstrual period 10/12/2020.  Physical Exam Constitutional:      Appearance: She is well-developed.  Genitourinary:     Genitourinary Comments: External: Normal appearing vulva. No lesions noted.  Speculum examination: Normal appearing cervix. No blood in the vaginal vault. No discharge.   Bimanual examination: Uterus midline, non-tender, normal in size, shape and contour.  No CMT. No adnexal masses. No adnexal tenderness. Pelvis not fixed.  Breast Exam: breast equal without skin changes, nipple discharge, breast lump or enlarged lymph nodes   HENT:     Head: Normocephalic and atraumatic.  Neck:     Thyroid: No thyromegaly.  Cardiovascular:     Rate and Rhythm: Normal rate and regular rhythm.     Heart sounds: Normal heart sounds.  Pulmonary:     Effort: Pulmonary effort is normal.     Breath sounds: Normal breath sounds.  Abdominal:     General: Bowel sounds are normal. There is no distension.     Palpations: Abdomen is soft. There is no mass.  Musculoskeletal:     Cervical back: Neck supple.  Neurological:     Mental Status: She is alert and oriented to person, place, and time.  Skin:    General: Skin is warm and dry.  Psychiatric:         Behavior: Behavior normal.        Thought Content: Thought content normal.        Judgment: Judgment normal.  Vitals reviewed.     Female chaperone present for pelvic and breast  portions of the physical exam  Assessment: 45 y.o. G1P1001 routine annual exam  Plan: Problem List Items Addressed This Visit   None Visit Diagnoses     Breast cancer screening by mammogram    -  Primary   Relevant Orders   MM 3D SCREEN BREAST BILATERAL   Screen for STD (sexually transmitted disease)       Relevant Orders   Cytology - PAP   HIV antibody (with reflex)   RPR   Hepatitis panel, acute   HSV(herpes smplx)abs-1+2(IgG+IgM)-bld   Colon cancer screening  Relevant Orders   Ambulatory referral to Gastroenterology   Cervical cancer screening       Encounter for annual routine gynecological examination       Health maintenance examination           1) Mammogram - recommend yearly screening mammogram.  Mammogram was ordered today.  2) STI screening was offered and declined.  3) ASCCP guidelines and rational discussed.  Pap smear today.   4) Colonoscopy -discussed, referral placed to GI  5) Routine healthcare maintenance including cholesterol, diabetes screening discussed managed by PCP   Adrian Prows MD, [place], [place] 11/05/2020 11:08 AM

## 2020-11-07 LAB — HIV ANTIBODY (ROUTINE TESTING W REFLEX): HIV Screen 4th Generation wRfx: NONREACTIVE

## 2020-11-07 LAB — RPR: RPR Ser Ql: NONREACTIVE

## 2020-11-07 LAB — HSV(HERPES SMPLX)ABS-I+II(IGG+IGM)-BLD
HSV 1 Glycoprotein G Ab, IgG: <0.91 index (ref 0.00–0.90)
HSV 2 IgG, Type Spec: <0.91 index (ref 0.00–0.90)
HSVI/II Comb IgM: 1.27 Ratio — ABNORMAL HIGH (ref 0.00–0.90)

## 2020-11-08 LAB — CYTOLOGY - PAP
Chlamydia: NEGATIVE
Comment: NEGATIVE
Comment: NEGATIVE
Comment: NEGATIVE
Comment: NORMAL
Diagnosis: NEGATIVE
High risk HPV: NEGATIVE
Neisseria Gonorrhea: NEGATIVE
Trichomonas: NEGATIVE

## 2020-11-09 ENCOUNTER — Other Ambulatory Visit: Payer: Self-pay

## 2020-11-09 MED ORDER — NA SULFATE-K SULFATE-MG SULF 17.5-3.13-1.6 GM/177ML PO SOLN: 1.0000 | ORAL | 0 refills | Status: DC

## 2020-11-17 ENCOUNTER — Telehealth: Payer: Self-pay

## 2020-11-17 NOTE — Telephone Encounter (Signed)
Pt called Endo and told them she had questions about the procedure  I spoke to pt and she expressed that she did not have any further questions at this time and stated she understood prep and clear liquid diet to start today

## 2020-11-18 ENCOUNTER — Ambulatory Visit: Payer: BC Managed Care – PPO | Admitting: Anesthesiology

## 2020-11-18 ENCOUNTER — Ambulatory Visit
Admission: RE | Admit: 2020-11-18 | Discharge: 2020-11-18 | Disposition: A | Discharge status: Home / Self Care | Payer: BC Managed Care – PPO | Attending: Gastroenterology | Admitting: Gastroenterology

## 2020-11-18 ENCOUNTER — Other Ambulatory Visit: Payer: Self-pay

## 2020-11-18 ENCOUNTER — Encounter
Admission: RE | Disposition: A | Discharge status: Home / Self Care | Payer: Self-pay | Source: Home / Self Care | Attending: Gastroenterology

## 2020-11-18 ENCOUNTER — Encounter: Payer: Self-pay | Admitting: Gastroenterology

## 2020-11-18 DIAGNOSIS — Z85828 Personal history of other malignant neoplasm of skin: Secondary | ICD-10-CM | POA: Insufficient documentation

## 2020-11-18 DIAGNOSIS — K6289 Other specified diseases of anus and rectum: Secondary | ICD-10-CM | POA: Diagnosis not present

## 2020-11-18 DIAGNOSIS — K5730 Diverticulosis of large intestine without perforation or abscess without bleeding: Secondary | ICD-10-CM | POA: Insufficient documentation

## 2020-11-18 DIAGNOSIS — Z87891 Personal history of nicotine dependence: Secondary | ICD-10-CM | POA: Insufficient documentation

## 2020-11-18 DIAGNOSIS — Z882 Allergy status to sulfonamides status: Secondary | ICD-10-CM | POA: Insufficient documentation

## 2020-11-18 DIAGNOSIS — Z1211 Encounter for screening for malignant neoplasm of colon: Secondary | ICD-10-CM

## 2020-11-18 HISTORY — PX: COLONOSCOPY: SHX5424

## 2020-11-18 LAB — POCT PREGNANCY, URINE: Preg Test, Ur: NEGATIVE

## 2020-11-18 SURGERY — COLONOSCOPY
Anesthesia: General

## 2020-11-18 MED ORDER — PROPOFOL 500 MG/50ML IV EMUL
INTRAVENOUS | Status: DC | PRN
  Administered 2020-11-18: 130 ug/kg/min via INTRAVENOUS

## 2020-11-18 MED ORDER — SODIUM CHLORIDE 0.9 % IV SOLN: INTRAVENOUS | Status: DC

## 2020-11-18 MED ORDER — MIDAZOLAM HCL 2 MG/2ML IJ SOLN
INTRAMUSCULAR | Status: AC
  Filled 2020-11-18: qty 2

## 2020-11-18 MED ORDER — MIDAZOLAM HCL 2 MG/2ML IJ SOLN
INTRAMUSCULAR | Status: DC | PRN
  Administered 2020-11-18: 2 mg via INTRAVENOUS

## 2020-11-18 MED ORDER — DEXMEDETOMIDINE HCL IN NACL 200 MCG/50ML IV SOLN
INTRAVENOUS | Status: DC | PRN
  Administered 2020-11-18 (×2): 12 ug via INTRAVENOUS
  Administered 2020-11-18: 8 ug via INTRAVENOUS

## 2020-11-18 MED ORDER — LIDOCAINE HCL (CARDIAC) PF 100 MG/5ML IV SOSY
PREFILLED_SYRINGE | INTRAVENOUS | Status: DC | PRN
  Administered 2020-11-18: 50 mg via INTRAVENOUS

## 2020-11-18 MED ORDER — PROPOFOL 10 MG/ML IV BOLUS
INTRAVENOUS | Status: DC | PRN
  Administered 2020-11-18: 100 mg via INTRAVENOUS

## 2020-11-18 NOTE — Anesthesia Postprocedure Evaluation (Signed)
Anesthesia Post Note  Patient: Megan Cummings  Procedure(s) Performed: COLONOSCOPY  Patient location during evaluation: Endoscopy Anesthesia Type: General Level of consciousness: awake and alert Pain management: pain level controlled Vital Signs Assessment: post-procedure vital signs reviewed and stable Respiratory status: spontaneous breathing, nonlabored ventilation, respiratory function stable and patient connected to nasal cannula oxygen Cardiovascular status: blood pressure returned to baseline and stable Postop Assessment: no apparent nausea or vomiting Anesthetic complications: no   No notable events documented.   Last Vitals:  Vitals:   11/18/20 1029 11/18/20 1039  BP:  (!) 98/53  Pulse: (!) 52 (!) 51  Resp: 16 14  Temp:    SpO2: 97% 99%    Last Pain:  Vitals:   11/18/20 1039  PainSc: 0-No pain                 [name]

## 2020-11-18 NOTE — H&P (Signed)
Vonda Antigua, MD [address] [place], [place], [place], [address] [address], [place], [place], [place], [address] Phone: [phone]  Fax: [phone]  Primary Care Physician:  Margo Common, PA-C   Pre-Procedure History & Physical: HPI:  Megan Cummings is a 45 y.o. female is here for a colonoscopy.   Past Medical History:  Diagnosis Date   Basal cell carcinoma 09/21/2015   Left anterior shoulder. Superficial and nodular patterns.   Basal cell carcinoma 09/28/2015   Left spinal upper back. Superficial.   Basal cell carcinoma 12/06/2016   Right forearm. Superficial.    Dysplastic nevus 12/06/2016   Right spinal mid back. Severe atypia and halo nevus effect, margin involved. Excised: 02/13/2017. Margins free   Dysplastic nevus 07/04/2017   Right upper back. Moderate atypia, close to margin.   Fibrocystic breast    Stress incontinence    Umbilical hernia 35/3614    Past Surgical History:  Procedure Laterality Date   FOOT SURGERY Right 07/2010    Prior to Admission medications   Medication Sig Start Date End Date Taking? Authorizing Provider  Na Sulfate-K Sulfate-Mg Sulf (SUPREP BOWEL PREP KIT) 17.5-3.13-1.6 GM/177ML SOLN Take 1 kit by mouth as directed. 11/09/20   Virgel Manifold, MD    Allergies as of 11/09/2020 - Review Complete 11/04/2020  Allergen Reaction Noted   Sulfa antibiotics  03/13/2015    Family History  Problem Relation Age of Onset   Healthy Mother    Hyperlipidemia Father    Healthy Brother    Healthy Son    Healthy Maternal Grandmother    Emphysema Maternal Grandfather    Lung cancer Paternal Grandfather    Colon cancer Paternal Grandmother    Breast cancer Neg Hx     Social History   Socioeconomic History   Marital status: Married    Spouse name: Not on file   Number of children: Not on file   Years of education: Not on file   Highest education level: Not on file  Occupational History   Not on file  Tobacco  Use   Smoking status: Former    Packs/day: 0.50    Years: 10.00    Pack years: 5.00    Types: Cigarettes   Smokeless tobacco: Never  Vaping Use   Vaping Use: Never used  Substance and Sexual Activity   Alcohol use: Never    Alcohol/week: 0.0 standard drinks    Comment: Occasionally drinks wine   Drug use: No   Sexual activity: Yes    Birth control/protection: None  Other Topics Concern   Not on file  Social History Narrative   Not on file   Social Determinants of Health   Financial Resource Strain: Not on file  Food Insecurity: Not on file  Transportation Needs: Not on file  Physical Activity: Not on file  Stress: Not on file  Social Connections: Not on file  Intimate Partner Violence: Not on file    Review of Systems: See HPI, otherwise negative ROS  Physical Exam: Constitutional: General:   Alert,  Well-developed, well-nourished, pleasant and cooperative in NAD BP 125/90   Pulse 73   Temp (!) 96.9 F (36.1 C)   Resp 17   Ht _0  (1.753 m)   Wt 70.3 kg   SpO2 100%   BMI 22.89 kg/m   Head: Normocephalic, atraumatic.   Eyes:  Sclera clear, no icterus.   Conjunctiva pink.   Mouth:  No deformity or lesions, oropharynx pink &  moist.  Neck:  Supple, trachea midline  Respiratory: Normal respiratory effort  Gastrointestinal:  Soft, non-tender and non-distended without masses, hepatosplenomegaly or hernias noted.  No guarding or rebound tenderness.     Cardiac: No clubbing or edema.  No cyanosis. Normal posterior tibial pedal pulses noted.  Lymphatic:  No significant cervical adenopathy.  Psych:  Alert and cooperative. Normal mood and affect.  Musculoskeletal:   Symmetrical without gross deformities. 5/5 Lower extremity strength bilaterally.  Skin: Warm. Intact without significant lesions or rashes. No jaundice.  Neurologic:  Face symmetrical, tongue midline, Normal sensation to touch;  grossly normal neurologically.  Psych:  Alert and oriented x3,  Alert and cooperative. Normal mood and affect.  Impression/Plan: Megan Cummings is here for a colonoscopy to be performed for average risk screening.  Risks, benefits, limitations, and alternatives regarding  colonoscopy have been reviewed with the patient.  Questions have been answered.  All parties agreeable.   Virgel Manifold, MD  11/18/2020, 9:39 AM

## 2020-11-18 NOTE — Anesthesia Preprocedure Evaluation (Signed)
Anesthesia Evaluation  Patient identified by MRN, date of birth, ID band Patient awake    Reviewed: Allergy & Precautions, NPO status , Patient's Chart, lab work & pertinent test results  History of Anesthesia Complications Negative for: history of anesthetic complications  Airway Mallampati: I  TM Distance: >3 FB Neck ROM: Full    Dental no notable dental hx. (+) Teeth Intact   Pulmonary neg pulmonary ROS, neg sleep apnea, neg COPD, Patient abstained from smoking.Not current smoker, former smoker,    Pulmonary exam normal breath sounds clear to auscultation       Cardiovascular Exercise Tolerance: Good METS(-) hypertension(-) CAD and (-) Past MI negative cardio ROS  (-) dysrhythmias  Rhythm:Regular Rate:Normal - Systolic murmurs    Neuro/Psych PSYCHIATRIC DISORDERS Anxiety negative neurological ROS     GI/Hepatic neg GERD  ,(+)     (-) substance abuse  ,   Endo/Other  neg diabetes  Renal/GU negative Renal ROS     Musculoskeletal   Abdominal   Peds  Hematology   Anesthesia Other Findings Past Medical History: 09/21/2015: Basal cell carcinoma     Comment:  Left anterior shoulder. Superficial and nodular               patterns. 09/28/2015: Basal cell carcinoma     Comment:  Left spinal upper back. Superficial. 12/06/2016: Basal cell carcinoma     Comment:  Right forearm. Superficial.  12/06/2016: Dysplastic nevus     Comment:  Right spinal mid back. Severe atypia and halo nevus               effect, margin involved. Excised: 02/13/2017. Margins               free 07/04/2017: Dysplastic nevus     Comment:  Right upper back. Moderate atypia, close to margin. No date: Fibrocystic breast No date: Stress incontinence 0000000: Umbilical hernia  Reproductive/Obstetrics                             Anesthesia Physical Anesthesia Plan  ASA: 1  Anesthesia Plan: General   Post-op Pain  Management:    Induction: Intravenous  PONV Risk Score and Plan: 3 and Ondansetron, Propofol infusion and TIVA  Airway Management Planned: Nasal Cannula  Additional Equipment: None  Intra-op Plan:   Post-operative Plan:   Informed Consent: I have reviewed the patients History and Physical, chart, labs and discussed the procedure including the risks, benefits and alternatives for the proposed anesthesia with the patient or authorized representative who has indicated his/her understanding and acceptance.     Dental advisory given  Plan Discussed with: CRNA and Surgeon  Anesthesia Plan Comments: (Discussed risks of anesthesia with patient, including possibility of difficulty with spontaneous ventilation under anesthesia necessitating airway intervention, PONV, and rare risks such as cardiac or respiratory or neurological events, and allergic reactions. Patient understands.)        Anesthesia Quick Evaluation

## 2020-11-18 NOTE — Op Note (Signed)
[facility] Gastroenterology Patient Name: Megan Cummings Procedure Date: 11/18/2020 9:42 AM MRN: [id number] Account #: [id number] Date of Birth: 09/18/1975 Admit Type: Outpatient Age: 45 Room: [facility] ENDO ROOM 4 Gender: Female Note Status: Finalized Procedure:             Colonoscopy Indications:           Screening for colorectal malignant neoplasm Providers:             Laikynn Pollio B. Bonna Gains MD, MD Medicines:             Monitored Anesthesia Care Complications:         No immediate complications. Procedure:             Pre-Anesthesia Assessment:                        - Prior to the procedure, a History and Physical was                         performed, and patient medications, allergies and                         sensitivities were reviewed. The patient's tolerance                         of previous anesthesia was reviewed.                        - The risks and benefits of the procedure and the                         sedation options and risks were discussed with the                         patient. All questions were answered and informed                         consent was obtained.                        - Patient identification and proposed procedure were                         verified prior to the procedure by the physician, the                         nurse, the anesthetist and the technician. The                         procedure was verified in the pre-procedure area in                         the procedure room in the endoscopy suite.                        - ASA Grade Assessment: II - A patient with mild                         systemic disease.                        -  After reviewing the risks and benefits, the patient                         was deemed in satisfactory condition to undergo the                         procedure.                        After obtaining informed consent, the colonoscope was                         passed under  direct vision. Throughout the procedure,                         the patient's blood pressure, pulse, and oxygen                         saturations were monitored continuously. The                         Colonoscope was introduced through the anus and                         advanced to the the cecum, identified by appendiceal                         orifice and ileocecal valve. The colonoscopy was                         performed with ease. The patient tolerated the                         procedure well. The quality of the bowel preparation                         was good. Findings:      The perianal and digital rectal examinations were normal.      A few diverticula were found in the entire colon.      The exam was otherwise without abnormality.      The rectum, sigmoid colon, descending colon, transverse colon, ascending       colon and cecum appeared normal.      Anal papilla(e) were hypertrophied.      No additional abnormalities were found on retroflexion. Impression:            - Diverticulosis in the entire examined colon.                        - The examination was otherwise normal.                        - The rectum, sigmoid colon, descending colon,                         transverse colon, ascending colon and cecum are normal.                        - Anal papilla(e) were hypertrophied.                        -  No specimens collected. Recommendation:        - Discharge patient to home.                        - Resume previous diet.                        - Continue present medications.                        - Repeat colonoscopy in 10 years for screening                         purposes.                        - Return to primary care physician as previously                         scheduled.                        - The findings and recommendations were discussed with                         the patient.                        - The findings and recommendations were  discussed with                         the patient's family.                        - High fiber diet.                        - In the future, if patient develops new symptoms such                         as blood per rectum, abdominal pain, weight loss,                         altered bowel habits or any other reason for concern,                         patient should discuss this with thier PCP as they may                         need a GI referral at that time or evaluation for need                         for colonoscopy earlier than the recommended screening                         colonoscopy.                        In addition, if patient's family history of colon  cancer changes (no family history at this time) in the                         future, earlier screening may be indicated and patient                         should discuss this with PCP as well. Procedure Code(s):     --- Professional ---                        [phone], Colonoscopy, flexible; diagnostic, including                         collection of specimen(s) by brushing or washing, when                         performed (separate procedure) Diagnosis Code(s):     --- Professional ---                        Z12.11, Encounter for screening for malignant neoplasm                         of colon CPT copyright 2019 [employer] Medical [employer]. All rights reserved. The codes documented in this report are preliminary and upon coder review may  be revised to meet current compliance requirements.  Vonda Antigua, MD Margretta Sidle B. Bonna Gains MD, MD 11/18/2020 10:19:12 AM This report has been signed electronically. Number of Addenda: 0 Note Initiated On: 11/18/2020 9:42 AM Scope Withdrawal Time: 0 hours 21 minutes 22 seconds  Total Procedure Duration: 0 hours 23 minutes 40 seconds       [facility]

## 2020-11-18 NOTE — Transfer of Care (Signed)
Immediate Anesthesia Transfer of Care Note  Patient: Megan Cummings  Procedure(s) Performed: COLONOSCOPY  Patient Location: PACU and Endoscopy Unit  Anesthesia Type:General  Level of Consciousness: drowsy and patient cooperative  Airway & Oxygen Therapy: Patient Spontanous Breathing  Post-op Assessment: Report given to RN and Post -op Vital signs reviewed and stable  Post vital signs: Reviewed and stable  Last Vitals:  Vitals Value Taken Time  BP 99/52 11/18/20 1020  Temp    Pulse 58 11/18/20 1020  Resp 15 11/18/20 1020  SpO2 98 % 11/18/20 1020  Vitals shown include unvalidated device data.  Last Pain:  Vitals:   11/18/20 1019  PainSc: 0-No pain         Complications: No notable events documented.

## 2020-11-19 ENCOUNTER — Encounter: Payer: Self-pay | Admitting: Gastroenterology

## 2020-12-31 ENCOUNTER — Other Ambulatory Visit: Payer: Self-pay | Admitting: Obstetrics and Gynecology

## 2020-12-31 ENCOUNTER — Telehealth: Payer: Self-pay

## 2020-12-31 DIAGNOSIS — N644 Mastodynia: Secondary | ICD-10-CM

## 2020-12-31 NOTE — Telephone Encounter (Signed)
Patient had apt w/Dr Gilman Schmidt in August. She needs an order for Bilateral Diagnostic Mammogram and order for Bilateral breast ultrasound due to pain in the left breast. She was advised when scheduling to contact us for these orders so she can be scheduled. [phone]

## 2020-12-31 NOTE — Telephone Encounter (Signed)
Orders placed.

## 2020-12-31 NOTE — Progress Notes (Signed)
Orders for diagnostic imaging for breast pain placed.

## 2021-01-01 NOTE — Telephone Encounter (Signed)
Patient notified

## 2021-01-07 ENCOUNTER — Ambulatory Visit
Admission: RE | Admit: 2021-01-07 | Discharge: 2021-01-07 | Disposition: A | Discharge status: Home / Self Care | Payer: BC Managed Care – PPO | Source: Ambulatory Visit | Attending: Obstetrics and Gynecology | Admitting: Obstetrics and Gynecology

## 2021-01-07 ENCOUNTER — Other Ambulatory Visit: Payer: Self-pay | Admitting: Obstetrics and Gynecology

## 2021-01-07 ENCOUNTER — Other Ambulatory Visit: Payer: Self-pay

## 2021-01-07 DIAGNOSIS — R922 Inconclusive mammogram: Secondary | ICD-10-CM | POA: Diagnosis not present

## 2021-01-07 DIAGNOSIS — R928 Other abnormal and inconclusive findings on diagnostic imaging of breast: Secondary | ICD-10-CM

## 2021-01-07 DIAGNOSIS — N644 Mastodynia: Secondary | ICD-10-CM

## 2021-01-07 DIAGNOSIS — N6320 Unspecified lump in the left breast, unspecified quadrant: Secondary | ICD-10-CM

## 2021-01-13 ENCOUNTER — Other Ambulatory Visit: Payer: Self-pay

## 2021-01-13 ENCOUNTER — Ambulatory Visit
Admission: RE | Admit: 2021-01-13 | Discharge: 2021-01-13 | Disposition: A | Discharge status: Home / Self Care | Payer: BC Managed Care – PPO | Source: Ambulatory Visit | Attending: Obstetrics and Gynecology | Admitting: Obstetrics and Gynecology

## 2021-01-13 DIAGNOSIS — C50412 Malignant neoplasm of upper-outer quadrant of left female breast: Secondary | ICD-10-CM | POA: Diagnosis not present

## 2021-01-13 DIAGNOSIS — N6320 Unspecified lump in the left breast, unspecified quadrant: Secondary | ICD-10-CM | POA: Diagnosis not present

## 2021-01-13 DIAGNOSIS — R928 Other abnormal and inconclusive findings on diagnostic imaging of breast: Secondary | ICD-10-CM

## 2021-01-13 DIAGNOSIS — N6325 Unspecified lump in the left breast, overlapping quadrants: Secondary | ICD-10-CM | POA: Diagnosis not present

## 2021-01-13 DIAGNOSIS — D0512 Intraductal carcinoma in situ of left breast: Secondary | ICD-10-CM | POA: Diagnosis not present

## 2021-01-13 HISTORY — PX: BREAST BIOPSY: SHX20

## 2021-01-18 DIAGNOSIS — C50919 Malignant neoplasm of unspecified site of unspecified female breast: Secondary | ICD-10-CM

## 2021-01-19 ENCOUNTER — Other Ambulatory Visit: Payer: Self-pay | Admitting: General Surgery

## 2021-01-19 ENCOUNTER — Inpatient Hospital Stay: Payer: BC Managed Care – PPO | Attending: Oncology | Admitting: Oncology

## 2021-01-19 ENCOUNTER — Inpatient Hospital Stay: Payer: BC Managed Care – PPO

## 2021-01-19 ENCOUNTER — Ambulatory Visit: Payer: Self-pay | Admitting: General Surgery

## 2021-01-19 ENCOUNTER — Encounter: Payer: Self-pay | Admitting: Oncology

## 2021-01-19 ENCOUNTER — Other Ambulatory Visit: Payer: Self-pay

## 2021-01-19 VITALS — BP 142/76 | HR 65 | Temp 99.3°F | Resp 16 | Wt 152.2 lb

## 2021-01-19 DIAGNOSIS — C50312 Malignant neoplasm of lower-inner quadrant of left female breast: Secondary | ICD-10-CM

## 2021-01-19 DIAGNOSIS — Z87891 Personal history of nicotine dependence: Secondary | ICD-10-CM | POA: Diagnosis not present

## 2021-01-19 DIAGNOSIS — Z170 Estrogen receptor positive status [ER+]: Secondary | ICD-10-CM | POA: Insufficient documentation

## 2021-01-19 DIAGNOSIS — C50919 Malignant neoplasm of unspecified site of unspecified female breast: Secondary | ICD-10-CM

## 2021-01-19 DIAGNOSIS — Z801 Family history of malignant neoplasm of trachea, bronchus and lung: Secondary | ICD-10-CM | POA: Diagnosis not present

## 2021-01-19 DIAGNOSIS — C50912 Malignant neoplasm of unspecified site of left female breast: Secondary | ICD-10-CM | POA: Insufficient documentation

## 2021-01-19 DIAGNOSIS — Z808 Family history of malignant neoplasm of other organs or systems: Secondary | ICD-10-CM | POA: Diagnosis not present

## 2021-01-19 DIAGNOSIS — Z7189 Other specified counseling: Secondary | ICD-10-CM

## 2021-01-19 LAB — CBC
HCT: 41 % (ref 36.0–46.0)
Hemoglobin: 13.6 g/dL (ref 12.0–15.0)
MCH: 31.1 pg (ref 26.0–34.0)
MCHC: 33.2 g/dL (ref 30.0–36.0)
MCV: 93.8 fL (ref 80.0–100.0)
Platelets: 426 10*3/uL — ABNORMAL HIGH (ref 150–400)
RBC: 4.37 MIL/uL (ref 3.87–5.11)
RDW: 11.9 % (ref 11.5–15.5)
WBC: 9.4 10*3/uL (ref 4.0–10.5)
nRBC: 0 % (ref 0.0–0.2)

## 2021-01-19 LAB — COMPREHENSIVE METABOLIC PANEL
ALT: 10 U/L (ref 0–44)
AST: 14 U/L — ABNORMAL LOW (ref 15–41)
Albumin: 4.5 g/dL (ref 3.5–5.0)
Alkaline Phosphatase: 45 U/L (ref 38–126)
Anion gap: 8 (ref 5–15)
BUN: 17 mg/dL (ref 6–20)
CO2: 28 mmol/L (ref 22–32)
Calcium: 9.4 mg/dL (ref 8.9–10.3)
Chloride: 101 mmol/L (ref 98–111)
Creatinine, Ser: 0.97 mg/dL (ref 0.44–1.00)
GFR, Estimated: >60 mL/min (ref >60)
Glucose, Bld: 71 mg/dL (ref 70–99)
Potassium: 4 mmol/L (ref 3.5–5.1)
Sodium: 137 mmol/L (ref 135–145)
Total Bilirubin: 1 mg/dL (ref 0.3–1.2)
Total Protein: 7.6 g/dL (ref 6.5–8.1)

## 2021-01-19 NOTE — H&P (Signed)
PATIENT PROFILE: Megan Cummings is a 45 y.o. female who presents to the Clinic for consultation at the request of Cloud, PA for evaluation of left breast cancer.  PCP:  Margo Common, PA  HISTORY OF PRESENT ILLNESS: [name] reports she was having pain on the left breast.  Pain localized to the lower outer area of the left breast.  Pain did not radiated to other part of the body.  Pain aggravated by applying pressure on the area.  No alleviating factors.  She had diagnostic mammogram done.  Diagnostic mammogram and ultrasound showed multiple round cyst without features of malignancy on the palpable area of pain.  She had an additional finding at the 9 o'clock position.  And 1.9 cm mass was seen on ultrasound.  This led to core biopsy.  Core biopsy showed invasive mammary carcinoma.  ER/PR/HER2/neu are pending.  Family history of breast cancer: None Family history of other cancers: Grandparents 1 week colon cancer and 1 with lymphoma Menarche: 45 years old LMP: September 2022 Used OCP: No Used estrogen and progesterone therapy: No History of Radiation to the chest: No Number of pregnancies: 1 Age of pregnancy: 32 No previous breast biopsy  PROBLEM LIST: Breast cancer  GENERAL REVIEW OF SYSTEMS:   General ROS: negative for - chills, fatigue, fever, weight gain or weight loss Allergy and Immunology ROS: negative for - hives  Hematological and Lymphatic ROS: negative for - bleeding problems or bruising, negative for palpable nodes Endocrine ROS: negative for - heat or cold intolerance, hair changes Respiratory ROS: negative for - cough, shortness of breath or wheezing Cardiovascular ROS: no chest pain or palpitations GI ROS: negative for nausea, vomiting, abdominal pain, diarrhea, constipation Musculoskeletal ROS: negative for - joint swelling or muscle pain Neurological ROS: negative for - confusion, syncope Dermatological ROS: negative for pruritus and  rash Psychiatric: negative for anxiety, depression, difficulty sleeping and memory loss  MEDICATIONS: No current outpatient medications on file.   No current facility-administered medications for this visit.    ALLERGIES: Sulfa (sulfonamide antibiotics)  PAST MEDICAL HISTORY: Past Medical History:  Diagnosis Date   Hyperlipidemia     PAST SURGICAL HISTORY: Past Surgical History:  Procedure Laterality Date   Toe surgery Right 2012   great toe     FAMILY HISTORY: Family History  Problem Relation Age of Onset   Hyperlipidemia (Elevated cholesterol) Father      SOCIAL HISTORY: Social History   Socioeconomic History   Marital status: Married  Tobacco Use   Smoking status: Never Smoker   Smokeless tobacco: Never Used  Scientific laboratory technician Use: Never used  Substance and Sexual Activity   Alcohol use: Yes    Comment: occasionally   Drug use: Never    PHYSICAL EXAM: Vitals:   01/19/21 1037  BP: (!) 150/81  Pulse: 63   There is no height or weight on file to calculate BMI.     GENERAL: Alert, active, oriented x3  HEENT: Pupils equal reactive to light. Extraocular movements are intact. Sclera clear. Palpebral conjunctiva normal red color.Pharynx clear.  NECK: Supple with no palpable mass and no adenopathy.  LUNGS: Sound clear with no rales rhonchi or wheezes.  HEART: Regular rhythm S1 and S2 without murmur.  BREAST: breasts appear normal, no suspicious masses, no skin or nipple changes or axillary nodes.  ABDOMEN: Soft and depressible, nontender with no palpable mass, no hepatomegaly.  EXTREMITIES: Well-developed well-nourished symmetrical with no dependent edema.  NEUROLOGICAL: Awake alert  oriented, facial expression symmetrical, moving all extremities.  REVIEW OF DATA: I have reviewed the following data today: No visits with results within 3 Month(s) from this visit.  Latest known visit with results is:  No results found for any previous visit.      ASSESSMENT: [name] is a 45 y.o. female presenting for consultation for left breast cancer.    Patient was oriented again about the pathology results. Surgical alternatives were discussed with patient including partial vs total mastectomy. Surgical technique and post operative care was discussed with patient. Risk of surgery was discussed with patient including but not limited to: wound infection, seroma, hematoma, brachial plexopathy, mondor's disease (thrombosis of small veins of breast), chronic wound pain, breast lymphedema, altered sensation to the nipple and cosmesis among others.   ER/PR/HER2 neu results are pending.  If she comes back with ER/PR positive, HER2/neu negative I would recommend to proceed with partial mastectomy.  Patient will be seen by medical oncology today.  We will discuss case with medical oncology to coordinate plan of care.  No primary diagnosis found.  PLAN: 1.  Left breast radiofrequency tagged partial mastectomy and left axillary sentinel needle biopsy ([id number], [id number]) 2. CBC, CMP 3. Avoid taking aspirin 5 days before surgery 4. Expect Medical Oncology evaluation today 5.  Contact us if you have any concern  Patient verbalized understanding, all questions were answered, and were agreeable with the plan outlined above.   Herbert Pun, MD  Electronically signed by Herbert Pun, MD

## 2021-01-19 NOTE — H&P (View-Only) (Signed)
PATIENT PROFILE: Megan Cummings is a 45 y.o. female who presents to the Clinic for consultation at the request of [place], [place] for evaluation of left breast cancer.  PCP:  Margo Common, PA  HISTORY OF PRESENT ILLNESS: Megan Cummings reports she was having pain on the left breast.  Pain localized to the lower outer area of the left breast.  Pain did not radiated to other part of the body.  Pain aggravated by applying pressure on the area.  No alleviating factors.  She had diagnostic mammogram done.  Diagnostic mammogram and ultrasound showed multiple round cyst without features of malignancy on the palpable area of pain.  She had an additional finding at the 9 o'clock position.  And 1.9 cm mass was seen on ultrasound.  This led to core biopsy.  Core biopsy showed invasive mammary carcinoma.  ER/PR/HER2/neu are pending.  Family history of breast cancer: None Family history of other cancers: Grandparents 1 week colon cancer and 1 with lymphoma Menarche: 45 years old LMP: September 2022 Used OCP: No Used estrogen and progesterone therapy: No History of Radiation to the chest: No Number of pregnancies: 1 Age of pregnancy: 32 No previous breast biopsy  PROBLEM LIST: Breast cancer  GENERAL REVIEW OF SYSTEMS:   General ROS: negative for - chills, fatigue, fever, weight gain or weight loss Allergy and Immunology ROS: negative for - hives  Hematological and Lymphatic ROS: negative for - bleeding problems or bruising, negative for palpable nodes Endocrine ROS: negative for - heat or cold intolerance, hair changes Respiratory ROS: negative for - cough, shortness of breath or wheezing Cardiovascular ROS: no chest pain or palpitations GI ROS: negative for nausea, vomiting, abdominal pain, diarrhea, constipation Musculoskeletal ROS: negative for - joint swelling or muscle pain Neurological ROS: negative for - confusion, syncope Dermatological ROS: negative for pruritus and  rash Psychiatric: negative for anxiety, depression, difficulty sleeping and memory loss  MEDICATIONS: No current outpatient medications on file.   No current facility-administered medications for this visit.    ALLERGIES: Sulfa (sulfonamide antibiotics)  PAST MEDICAL HISTORY: Past Medical History:  Diagnosis Date   Hyperlipidemia     PAST SURGICAL HISTORY: Past Surgical History:  Procedure Laterality Date   Toe surgery Right 2012   great toe     FAMILY HISTORY: Family History  Problem Relation Age of Onset   Hyperlipidemia (Elevated cholesterol) Father      SOCIAL HISTORY: Social History   Socioeconomic History   Marital status: Married  Tobacco Use   Smoking status: Never Smoker   Smokeless tobacco: Never Used  Scientific laboratory technician Use: Never used  Substance and Sexual Activity   Alcohol use: Yes    Comment: occasionally   Drug use: Never    PHYSICAL EXAM: Vitals:   01/19/21 1037  BP: (!) 150/81  Pulse: 63   There is no height or weight on file to calculate BMI.     GENERAL: Alert, active, oriented x3  HEENT: Pupils equal reactive to light. Extraocular movements are intact. Sclera clear. Palpebral conjunctiva normal red color.Pharynx clear.  NECK: Supple with no palpable mass and no adenopathy.  LUNGS: Sound clear with no rales rhonchi or wheezes.  HEART: Regular rhythm S1 and S2 without murmur.  BREAST: breasts appear normal, no suspicious masses, no skin or nipple changes or axillary nodes.  ABDOMEN: Soft and depressible, nontender with no palpable mass, no hepatomegaly.  EXTREMITIES: Well-developed well-nourished symmetrical with no dependent edema.  NEUROLOGICAL: Awake alert  oriented, facial expression symmetrical, moving all extremities.  REVIEW OF DATA: I have reviewed the following data today: No visits with results within 3 Month(s) from this visit.  Latest known visit with results is:  No results found for any previous visit.      ASSESSMENT: Megan Cummings is a 45 y.o. female presenting for consultation for left breast cancer.    Patient was oriented again about the pathology results. Surgical alternatives were discussed with patient including partial vs total mastectomy. Surgical technique and post operative care was discussed with patient. Risk of surgery was discussed with patient including but not limited to: wound infection, seroma, hematoma, brachial plexopathy, mondor's disease (thrombosis of small veins of breast), chronic wound pain, breast lymphedema, altered sensation to the nipple and cosmesis among others.   ER/PR/HER2 neu results are pending.  If she comes back with ER/PR positive, HER2/neu negative I would recommend to proceed with partial mastectomy.  Patient will be seen by medical oncology today.  We will discuss case with medical oncology to coordinate plan of care.  No primary diagnosis found.  PLAN: 1.  Left breast radiofrequency tagged partial mastectomy and left axillary sentinel needle biopsy (19301, [id number]) 2. CBC, CMP 3. Avoid taking aspirin 5 days before surgery 4. Expect Medical Oncology evaluation today 5.  Contact us if you have any concern  Patient verbalized understanding, all questions were answered, and were agreeable with the plan outlined above.   Herbert Pun, MD  Electronically signed by Herbert Pun, MD

## 2021-01-20 ENCOUNTER — Ambulatory Visit: Admission: RE | Admit: 2021-01-20 | Payer: BC Managed Care – PPO | Source: Ambulatory Visit

## 2021-01-20 ENCOUNTER — Ambulatory Visit
Admission: RE | Admit: 2021-01-20 | Discharge: 2021-01-20 | Disposition: A | Discharge status: Home / Self Care | Payer: BC Managed Care – PPO | Source: Ambulatory Visit | Attending: General Surgery | Admitting: General Surgery

## 2021-01-20 ENCOUNTER — Other Ambulatory Visit: Payer: Self-pay | Admitting: General Surgery

## 2021-01-20 DIAGNOSIS — C50312 Malignant neoplasm of lower-inner quadrant of left female breast: Secondary | ICD-10-CM | POA: Diagnosis not present

## 2021-01-20 DIAGNOSIS — C50812 Malignant neoplasm of overlapping sites of left female breast: Secondary | ICD-10-CM | POA: Diagnosis not present

## 2021-01-22 ENCOUNTER — Other Ambulatory Visit: Payer: Self-pay | Admitting: Radiology

## 2021-01-22 ENCOUNTER — Encounter
Admission: RE | Admit: 2021-01-22 | Discharge: 2021-01-22 | Disposition: A | Discharge status: Home / Self Care | Payer: BC Managed Care – PPO | Source: Ambulatory Visit | Attending: General Surgery | Admitting: General Surgery

## 2021-01-22 ENCOUNTER — Other Ambulatory Visit: Payer: Self-pay

## 2021-01-22 ENCOUNTER — Encounter: Payer: Self-pay | Admitting: Oncology

## 2021-01-22 DIAGNOSIS — Z7189 Other specified counseling: Secondary | ICD-10-CM | POA: Insufficient documentation

## 2021-01-22 DIAGNOSIS — Z170 Estrogen receptor positive status [ER+]: Secondary | ICD-10-CM | POA: Insufficient documentation

## 2021-01-22 DIAGNOSIS — C50912 Malignant neoplasm of unspecified site of left female breast: Secondary | ICD-10-CM | POA: Insufficient documentation

## 2021-01-22 HISTORY — DX: Personal history of urinary calculi: Z87.442

## 2021-01-22 HISTORY — DX: Other complications of anesthesia, initial encounter: T88.59XA

## 2021-01-22 HISTORY — DX: Malignant neoplasm of unspecified site of unspecified female breast: C50.919

## 2021-01-22 NOTE — Patient Instructions (Signed)
Your procedure is scheduled on:01-25-21 Monday Report to the Registration Desk on the 1st floor of the [facility].Then proceed to the Radiology Desk (2nd desk on right) in the [facility] @ 7:30 AM as instructed by office  REMEMBER: Instructions that are not followed completely may result in serious medical risk, up to and including death; or upon the discretion of your surgeon and anesthesiologist your surgery may need to be rescheduled.  Do not eat food after midnight the night before surgery.  No gum chewing, lozengers or hard candies.  You may however, drink CLEAR liquids up to 2 hours before you are scheduled to arrive for your surgery. Do not drink anything within 2 hours of your scheduled arrival time.  Clear liquids include: - water  - apple juice without pulp - gatorade (not RED, PURPLE, OR BLUE) - black coffee or tea (Do NOT add milk or creamers to the coffee or tea) Do NOT drink anything that is not on this list.  Do not take any medication the day of surgery  One week prior to surgery: Stop Anti-inflammatories (NSAIDS) such as Advil, Aleve, Ibuprofen, Motrin, Naproxen, Naprosyn and Aspirin based products such as Excedrin, Goodys Powder, BC Powder.You may however, take Tylenol if needed for pain up until the day of surgery.  Stop ANY OVER THE COUNTER supplements/vitamins until after surgery.  No Alcohol for 24 hours before or after surgery.  No Smoking including e-cigarettes for 24 hours prior to surgery.  No chewable tobacco products for at least 6 hours prior to surgery.  No nicotine patches on the day of surgery.  Do not use any "recreational" drugs for at least a week prior to your surgery.  Please be advised that the combination of cocaine and anesthesia may have negative outcomes, up to and including death. If you test positive for cocaine, your surgery will be cancelled.  On the morning of surgery brush your teeth with toothpaste and water, you may rinse  your mouth with mouthwash if you wish. Do not swallow any toothpaste or mouthwash.  Do not wear jewelry, make-up, hairpins, clips or nail polish.  Do not wear lotions, powders, or perfumes.   Do not shave body from the neck down 48 hours prior to surgery just in case you cut yourself which could leave a site for infection.  Also, freshly shaved skin may become irritated if using the CHG soap.  Contact lenses, hearing aids and dentures may not be worn into surgery.  Do not bring valuables to the hospital. [facility] is not responsible for any missing/lost belongings or valuables.   Notify your doctor if there is any change in your medical condition (cold, fever, infection).  Wear comfortable clothing (specific to your surgery type) to the hospital.  After surgery, you can help prevent lung complications by doing breathing exercises.  Take deep breaths and cough every 1-2 hours. Your doctor may order a device called an Incentive Spirometer to help you take deep breaths. When coughing or sneezing, hold a pillow firmly against your incision with both hands. This is called "splinting." Doing this helps protect your incision. It also decreases belly discomfort.  If you are being admitted to the hospital overnight, leave your suitcase in the car. After surgery it may be brought to your room.  If you are being discharged the day of surgery, you will not be allowed to drive home. You will need a responsible adult (18 years or older) to drive you home and stay  with you that night.   If you are taking public transportation, you will need to have a responsible adult (18 years or older) with you. Please confirm with your physician that it is acceptable to use public transportation.   Please call the [facility] at [phone] if you have any questions about these instructions.  Surgery Visitation Policy:  Patients undergoing a surgery or procedure may have one family member  or support person with them as long as that person is not COVID-19 positive or experiencing its symptoms.  That person may remain in the waiting area during the procedure and may rotate out with other people.  Inpatient Visitation:    Visiting hours are 7 a.m. to 8 p.m. Up to two visitors ages 16+ are allowed at one time in a patient room. The visitors may rotate out with other people during the day. Visitors must check out when they leave, or other visitors will not be allowed. One designated support person may remain overnight. The visitor must pass COVID-19 screenings, use hand sanitizer when entering and exiting the patient's room and wear a mask at all times, including in the patient's room. Patients must also wear a mask when staff or their visitor are in the room. Masking is required regardless of vaccination status.

## 2021-01-22 NOTE — Progress Notes (Signed)
Hematology/Oncology Consult note [facility] [phone] [phone]  Patient Care Team: Chrismon, Vickki Muff, PA-C (Inactive) as PCP - General (Family Medicine)   Name of the patient: Megan Cummings  [id number]  Apr 04, 1976    Reason for referral-new diagnosis of breast cancer   Referring physician-Dr. Annia Belt  Date of visit: 01/22/21   History of presenting illness- Patient is a 45 year old premenopausal female who recently underwent a diagnostic bilateral mammogram after she complained of pain in her left breast.  Mammogram showed innumerable cysts in the left breast the largest of which was 1.2 x 1.1 x 1.4 cm.  Targeted ultrasound of the inner left breast showed a hypoechoic mass measuring 1 x 0.8 x 1.9 cm.  No definite lymphadenopathy noted in the left axilla.  No suspicious findings in the right breast.  She has had her last mammogram in 2019 when was again noted to have bilateral breast cysts and subsequent ultrasounds showed benign cysts with no suspicious lesions.Ultrasound-guided biopsy of the left breast mass showed invasive mammary carcinoma 6 mm grade 1 ER 81 to 90% positive PR 91 200% positive and HER2 equivocal by IHC and FISH testing pending  Menarche at the age of 68.  She is G1, P1 L1.  No use of birth control.  No prior hysterectomy.  She is still premenopausal and gets her menstrual cycles regularly.  Family history significant for colon cancer in paternal grandmother and lung cancer or lymphoma in maternal grandfather.  ECOG PS- 0  Pain scale- 0   Review of systems- Review of Systems  Constitutional:  Negative for chills, fever, malaise/fatigue and weight loss.  HENT:  Negative for congestion, ear discharge and nosebleeds.   Eyes:  Negative for blurred vision.  Respiratory:  Negative for cough, hemoptysis, sputum production, shortness of breath and wheezing.   Cardiovascular:  Negative for chest pain,  palpitations, orthopnea and claudication.  Gastrointestinal:  Negative for abdominal pain, blood in stool, constipation, diarrhea, heartburn, melena, nausea and vomiting.  Genitourinary:  Negative for dysuria, flank pain, frequency, hematuria and urgency.  Musculoskeletal:  Negative for back pain, joint pain and myalgias.  Skin:  Negative for rash.  Neurological:  Negative for dizziness, tingling, focal weakness, seizures, weakness and headaches.  Endo/Heme/Allergies:  Does not bruise/bleed easily.  Psychiatric/Behavioral:  Negative for depression and suicidal ideas. The patient does not have insomnia.    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 Superficial and nodular patterns.   Basal cell carcinoma 09/28/2015   Left spinal upper back. Superficial.   Basal cell carcinoma 12/06/2016   Right forearm. Superficial.    Dysplastic nevus 12/06/2016   Right spinal mid back. Severe atypia and halo nevus effect, margin involved. Excised: 02/13/2017. Margins free   Dysplastic nevus 07/04/2017   Right upper back. Moderate atypia, close to margin.   Fibrocystic breast    Stress incontinence    Umbilical hernia 27/0623     Past Surgical History:  Procedure Laterality Date   BREAST BIOPSY Left 01/13/2021   [place] bx/ ribbon clip/ path pending   COLONOSCOPY N/A 11/18/2020   Procedure: COLONOSCOPY;  Surgeon: Virgel Manifold, MD;  Location: [facility] ENDOSCOPY;  Service: Endoscopy;   Laterality: N/A;   FOOT SURGERY Right 07/04/2010    Social History   Socioeconomic History   Marital status: Married    Spouse name: Not on file   Number of children: Not on file   Years of education: Not on file   Highest education level: Not on file  Occupational History   Not on file  Tobacco Use   Smoking status: Former    Packs/day: 0.50    Years: 10.00    Pack years: 5.00    Types: Cigarettes   Smokeless tobacco: Never  Vaping Use   Vaping Use: Never used  Substance and Sexual Activity   Alcohol use: Never    Alcohol/week: 0.0 standard drinks    Comment: Occasionally drinks wine   Drug use: No   Sexual activity: Yes    Birth control/protection: None  Other Topics Concern   Not on file  Social History Narrative   Not on file   Social Determinants of Health   Financial Resource Strain: Not on file  Food Insecurity: Not on file  Transportation Needs: Not on file  Physical Activity: Not on file  Stress: Not on file  Social Connections: Not on file  Intimate Partner Violence: Not on file     Family History  Problem Relation Age of Onset   Healthy Mother    Hyperlipidemia Father    Healthy Brother    Healthy Son    Healthy Maternal Grandmother    Emphysema Maternal Grandfather    Lung cancer Paternal Grandfather    Colon cancer Paternal Grandmother    Breast cancer Neg Hx      Current Outpatient Medications:    Na Sulfate-K Sulfate-Mg Sulf (SUPREP BOWEL PREP KIT) 17.5-3.13-1.6 GM/177ML SOLN, Take 1 kit by mouth as directed. (Patient not taking: No sig reported), Disp: 354 mL, Rfl: 0   Physical exam:  Vitals:   01/19/21 1337  BP: (!) 142/76  Pulse: 65  Resp: 16  Temp: 99.3 F (37.4 C)  SpO2: 100%  Weight: 152 lb 3.2 oz (69 kg)   Physical Exam Constitutional:      General: She is not in acute distress. Cardiovascular:     Rate and Rhythm: Normal rate and regular rhythm.     Heart sounds: Normal heart sounds.  Pulmonary:     Effort:  Pulmonary effort is normal.     Breath sounds: Normal breath sounds.  Abdominal:     General: Bowel sounds are normal.     Palpations: Abdomen is soft.  Skin:    General: Skin is warm and dry.  Neurological:     Mental Status: She is alert and oriented to person, place, and time.    Breast exam: No palpable bilateral axillary adenopathy.  There is induration at the site of biopsy without a distinct palpable breast mass   CMP Latest Ref Rng & Units 01/19/2021  Glucose 70 - 99 mg/dL 71  BUN 6 - 20 mg/dL 17  Creatinine 0.44 - 1.00 mg/dL 0.97  Sodium 135 - 145 mmol/L 137  Potassium 3.5 - 5.1 mmol/L 4.0  Chloride 98 - 111 mmol/L 101  CO2 22 - 32 mmol/L 28  Calcium 8.9 - 10.3 mg/dL 9.4  Total Protein 6.5 - 8.1 g/dL  7.6  Total Bilirubin 0.3 - 1.2 mg/dL 1.0  Alkaline Phos 38 - 126 U/L 45  AST 15 - 41 U/L 14(L)  ALT 0 - 44 U/L 10   CBC Latest Ref Rng & Units 01/19/2021  WBC 4.0 - 10.5 K/uL 9.4  Hemoglobin 12.0 - 15.0 g/dL 13.6  Hematocrit 36.0 - 46.0 % 41.0  Platelets 150 - 400 K/uL 426(H)    No images are attached to the encounter.  US BREAST LTD UNI LEFT INC AXILLA  Result Date: 01/07/2021 CLINICAL DATA:  45 year old female with focal pain involving the slightly lower outer left breast. EXAM: DIGITAL DIAGNOSTIC BILATERAL MAMMOGRAM WITH TOMOSYNTHESIS AND CAD; ULTRASOUND LEFT BREAST LIMITED TECHNIQUE: Bilateral digital diagnostic mammography and breast tomosynthesis was performed. The images were evaluated with computer-aided detection.; Targeted ultrasound examination of the left breast was performed. COMPARISON:  Previous exams. ACR Breast Density Category d: The breast tissue is extremely dense, which lowers the sensitivity of mammography. FINDINGS: Spot compression tomograms were performed over the area of pain in the slightly outer left breast demonstrating stable appearance of extremely dense fibroglandular tissue in this location. There is a mass (at least 1 mass) with associated  distortion in the inner left breast which persists on the spot compression CC tomograms. Physical examination of the lower inner left breast reveals a firm mass at the approximate 8:30 position. Targeted ultrasound of the site of pain in the lower outer left breast was performed demonstrating innumerable cysts, the largest of which measures 1.2 x 1.1 x 1.4 cm. Targeted ultrasound of the inner left breast was performed demonstrating an irregular shadowing hypoechoic mass at 9 o'clock 1 cm from the nipple measuring 1 x 0.8 x 1.9 cm. No definite lymphadenopathy seen in the left axilla. IMPRESSION: Suspicious 1.9 cm mass in the left breast at the 9 o'clock position. RECOMMENDATION: Recommend ultrasound-guided biopsy of the mass in the left breast at the 9 o'clock position. I have discussed the findings and recommendations with the patient. If applicable, a reminder letter will be sent to the patient regarding the next appointment. BI-RADS CATEGORY  5: Highly suggestive of malignancy. Electronically Signed   By: Everlean Alstrom M.D.   On: 01/07/2021 10:10  MM DIAG BREAST TOMO UNI LEFT  Result Date: 01/20/2021 CLINICAL DATA:  Status post ultrasound-guided RF ID tag placement EXAM: DIAGNOSTIC LEFT MAMMOGRAM POST ULTRASOUND-GUIDED RF ID tag PLACEMENT COMPARISON:  Previous exam(s). FINDINGS: Mammographic images were obtained following ultrasound-guided RF ID tag placement. These demonstrate RF ID tag immediately adjacent to RIBBON clip at site of biopsy-proven malignancy in the LEFT inner breast at posterior depth. IMPRESSION: Appropriate location of the RF ID tag. Final Assessment: Post Procedure Mammograms for RF ID tag placement Electronically Signed   By: Valentino Saxon M.D.   On: 01/20/2021 16:23  MM DIAG BREAST TOMO BILATERAL  Result Date: 01/07/2021 CLINICAL DATA:  45 year old female with focal pain involving the slightly lower outer left breast. EXAM: DIGITAL DIAGNOSTIC BILATERAL MAMMOGRAM WITH  TOMOSYNTHESIS AND CAD; ULTRASOUND LEFT BREAST LIMITED TECHNIQUE: Bilateral digital diagnostic mammography and breast tomosynthesis was performed. The images were evaluated with computer-aided detection.; Targeted ultrasound examination of the left breast was performed. COMPARISON:  Previous exams. ACR Breast Density Category d: The breast tissue is extremely dense, which lowers the sensitivity of mammography. FINDINGS: Spot compression tomograms were performed over the area of pain in the slightly outer left breast demonstrating stable appearance of extremely dense fibroglandular tissue in this location. There is a mass (at least  1 mass) with associated distortion in the inner left breast which persists on the spot compression CC tomograms. Physical examination of the lower inner left breast reveals a firm mass at the approximate 8:30 position. Targeted ultrasound of the site of pain in the lower outer left breast was performed demonstrating innumerable cysts, the largest of which measures 1.2 x 1.1 x 1.4 cm. Targeted ultrasound of the inner left breast was performed demonstrating an irregular shadowing hypoechoic mass at 9 o'clock 1 cm from the nipple measuring 1 x 0.8 x 1.9 cm. No definite lymphadenopathy seen in the left axilla. IMPRESSION: Suspicious 1.9 cm mass in the left breast at the 9 o'clock position. RECOMMENDATION: Recommend ultrasound-guided biopsy of the mass in the left breast at the 9 o'clock position. I have discussed the findings and recommendations with the patient. If applicable, a reminder letter will be sent to the patient regarding the next appointment. BI-RADS CATEGORY  5: Highly suggestive of malignancy. Electronically Signed   By: Everlean Alstrom M.D.   On: 01/07/2021 10:10  MM CLIP PLACEMENT LEFT  Result Date: 01/13/2021 CLINICAL DATA:  Status post ultrasound-guided core biopsy of a left breast mass. EXAM: 3D DIAGNOSTIC LEFT MAMMOGRAM POST ULTRASOUND BIOPSY COMPARISON:  Previous  exam(s). FINDINGS: 3D Mammographic images were obtained following ultrasound guided biopsy of the left breast. The biopsy marking clip is in expected location in the 9 o'clock region of the left breast. IMPRESSION: Appropriate positioning of the ribbon shaped biopsy marking clip at the site of biopsy in the 9 o'clock region of the left breast. Final Assessment: Post Procedure Mammograms for Marker Placement Electronically Signed   By: Lillia Mountain M.D.   On: 01/13/2021 09:42  [place] LT BREAST BX W LOC DEV 1ST LESION IMG BX SPEC US GUIDE  Addendum Date: 01/14/2021   ADDENDUM REPORT: 01/14/2021 16:31 ADDENDUM: PATHOLOGY revealed: A. LEFT BREAST; ULTRASOUND-GUIDED BIOPSY: - INVASIVE MAMMARY CARCINOMA, NO SPECIAL TYPE. Grade 1. Ductal carcinoma in situ: Present. Lymphovascular invasion: Not identified . Comment: The definitive grade will be assigned on the excisional specimen. Pathology results are CONCORDANT with imaging findings, per Dr. Lillia Mountain. Pathology results and recommendations were discussed with patient via telephone on 01/14/2021. Patient reported biopsy site doing well with no adverse symptoms, and only slight tenderness at the site. Post biopsy care instructions were reviewed, questions were answered and my direct phone number was provided. Patient was instructed to call [facility] for any additional questions or concerns related to biopsy site. RECOMMENDATIONS: 1. Surgical consultation. Request for surgical consultation relayed to Al Pimple RN at [facility] by Electa Sniff RN on 01/14/2021. 2. Consider bilateral breast MRI given patient's age and presence of extremely dense breast tissue which lowers sensitivity of mammography. Pathology results reported by Electa Sniff RN on 01/14/2021. Electronically Signed   By: Lillia Mountain M.D.   On: 01/14/2021 16:31   Result Date: 01/14/2021 CLINICAL DATA:  Left breast mass. EXAM: ULTRASOUND GUIDED LEFT BREAST CORE NEEDLE BIOPSY  COMPARISON:  Previous exam(s). PROCEDURE: I met with the patient and we discussed the procedure of ultrasound-guided biopsy, including benefits and alternatives. We discussed the high likelihood of a successful procedure. We discussed the risks of the procedure, including infection, bleeding, tissue injury, clip migration, and inadequate sampling. Informed written consent was given. The usual time-out protocol was performed immediately prior to the procedure. Lesion quadrant: 9 o'clock Using sterile technique and 1% Lidocaine as local anesthetic, under direct ultrasound visualization, a 14 gauge spring-loaded  device was used to perform biopsy of a mass in the 9 o'clock region of the left breast using a lateral to medial approach. At the conclusion of the procedure ribbon shaped tissue marker clip was deployed into the biopsy cavity. Follow up 2 view mammogram was performed and dictated separately. IMPRESSION: Ultrasound guided biopsy of the left breast. No apparent complications. Electronically Signed: By: Lillia Mountain M.D. On: 01/13/2021 09:28   [place] LT RADIO FREQUENCY TAG LOC US GUIDE  Result Date: 01/20/2021 CLINICAL DATA:  Biopsy-proven LEFT breast cancer (RIBBON). Localization for surgery. EXAM: RF ID tag placement OF THE LEFT BREAST WITH ULTRASOUND GUIDANCE COMPARISON:  Previous exams. FINDINGS: Patient presents for needle localization prior to LEFT lumpectomy. I met with the patient and we discussed the procedure of needle localization including benefits and alternatives. We discussed the high likelihood of a successful procedure. We discussed the risks of the procedure, including infection, bleeding, tissue injury, and further surgery. Informed, written consent was given. The usual time-out protocol was performed immediately prior to the procedure. Using ultrasound guidance, sterile technique, 1% lidocaine and a 5 cm RF ID tag needle ([id number]), the mass at 9 o'clock 1 cm from the nipple was localized using a  medial approach. RF tag function was confirmed with an auditory signal from the radiofrequency guide. The images were marked for Dr. Windell Moment. IMPRESSION: RF ID tag localization of the LEFT breast. No apparent complications. Electronically Signed   By: Valentino Saxon M.D.   On: 01/20/2021 16:22   Assessment and plan- Patient is a 45 y.o. female with newly diagnosed invasive mammary carcinoma of the left breast clinical prognostic stage Ia cT1 ccN0 cM0 ER 81-90% positive PR91 -00% positive and HER2/neu nu equivocal by IHC and FISH testing pending  Discussed the results of mammogram and biopsy with the patient in detail.  Patient noted to have a 1.1 cm left breast mass which was grade 1.  That typically indicates a slow-growing etiology.  She is ER and PR strongly positive.  With grade 1 histology I would expect her HER2 FISH to come back negative but that is currently pending.  Regardless of HER2 testing I would recommend upfront lumpectomy and patient is already seeing Dr. Peyton Najjar and is scheduled to undergo  lumpectomy on 01/25/2021.  I will see her back a week following that to discuss final pathology results and further management.  If her tumor is ER/PR positive and HER2 negative I would recommend Oncotype testing to decide if she would benefit from adjuvant chemotherapy.  Discussed what Oncotype testing is and how results are interpreted.  If sentinel lymph node biopsy shows lymph node positive disease she would benefit from adjuvant chemotherapy given that she is premenopausal regardless of Oncotype.  However if she is lymph node negative-we will use Oncotype testing to decide if she needs chemotherapy or not.  If she is HER2 positive by FISH she would benefit from adjuvant chemotherapy as well.  I will discuss all these details with her after final pathology is back.  Patient will also benefit from adjuvant radiation following lumpectomy which I will make the referral for in the future.    Also  given the fact that she is ER/PR positive there would be a role for hormone therapy which is typically started after adjuvant radiation is completed.  I will discuss the pros and cons of hormone therapy with her down the line as well..  Treatment will be given with a curative intent  Cancer Staging  Malignant neoplasm of breast, stage 1, estrogen receptor positive, left ([place]) Staging form: Breast, AJCC 8th Edition - Clinical stage from 01/22/2021: Stage IA (cT1c, cN0, cM0, G1, ER+, PR+, HER2: Equivocal) - Signed by Sindy Guadeloupe, MD on 01/22/2021 Stage prefix: Initial diagnosis Histologic grading system: 3 grade system    Thank you for this kind referral and the opportunity to participate in the care of this patient   Visit Diagnosis 1. Malignant neoplasm of breast, stage 1, estrogen receptor positive, left ([place])   2. Goals of care, counseling/discussion     Dr. Randa Evens, MD, MPH [facility] [phone] 01/22/2021

## 2021-01-25 ENCOUNTER — Ambulatory Visit: Payer: BC Managed Care – PPO | Admitting: Certified Registered"

## 2021-01-25 ENCOUNTER — Encounter: Payer: Self-pay | Admitting: Oncology

## 2021-01-25 ENCOUNTER — Other Ambulatory Visit: Payer: Self-pay

## 2021-01-25 ENCOUNTER — Ambulatory Visit
Admission: RE | Admit: 2021-01-25 | Discharge: 2021-01-25 | Disposition: A | Discharge status: Home / Self Care | Payer: BC Managed Care – PPO | Source: Ambulatory Visit | Attending: General Surgery | Admitting: General Surgery

## 2021-01-25 ENCOUNTER — Encounter: Payer: Self-pay | Admitting: General Surgery

## 2021-01-25 ENCOUNTER — Encounter
Admission: RE | Disposition: A | Discharge status: Home / Self Care | Payer: Self-pay | Source: Home / Self Care | Attending: General Surgery

## 2021-01-25 ENCOUNTER — Ambulatory Visit
Admission: RE | Admit: 2021-01-25 | Discharge: 2021-01-25 | Disposition: A | Discharge status: Home / Self Care | Payer: BC Managed Care – PPO | Attending: General Surgery | Admitting: General Surgery

## 2021-01-25 DIAGNOSIS — Z882 Allergy status to sulfonamides status: Secondary | ICD-10-CM | POA: Insufficient documentation

## 2021-01-25 DIAGNOSIS — C50312 Malignant neoplasm of lower-inner quadrant of left female breast: Secondary | ICD-10-CM

## 2021-01-25 DIAGNOSIS — R928 Other abnormal and inconclusive findings on diagnostic imaging of breast: Secondary | ICD-10-CM | POA: Diagnosis not present

## 2021-01-25 HISTORY — PX: PART MASTECTOMY,RADIO FREQUENCY LOCALIZER,AXILLARY SENTINEL NODE BIOPSY: SHX6901

## 2021-01-25 HISTORY — PX: BREAST LUMPECTOMY: SHX2

## 2021-01-25 LAB — SURGICAL PATHOLOGY

## 2021-01-25 LAB — POCT PREGNANCY, URINE: Preg Test, Ur: NEGATIVE

## 2021-01-25 SURGERY — PART MASTECTOMY,RADIO FREQUENCY LOCALIZER,AXILLARY SENTINEL NODE BIOPSY
Anesthesia: General | Site: Breast | Laterality: Left

## 2021-01-25 MED ORDER — FENTANYL CITRATE (PF) 100 MCG/2ML IJ SOLN
INTRAMUSCULAR | Status: AC
  Filled 2021-01-25: qty 2

## 2021-01-25 MED ORDER — CHLORHEXIDINE GLUCONATE 0.12 % MT SOLN: 15.0000 mL | Freq: Once | OROMUCOSAL | Status: DC

## 2021-01-25 MED ORDER — CEFAZOLIN SODIUM-DEXTROSE 2-4 GM/100ML-% IV SOLN
INTRAVENOUS | Status: AC
  Filled 2021-01-25: qty 100

## 2021-01-25 MED ORDER — KETOROLAC TROMETHAMINE 30 MG/ML IJ SOLN
INTRAMUSCULAR | Status: AC
  Filled 2021-01-25: qty 1

## 2021-01-25 MED ORDER — LACTATED RINGERS IV SOLN: INTRAVENOUS | Status: DC | PRN

## 2021-01-25 MED ORDER — DEXAMETHASONE SODIUM PHOSPHATE 10 MG/ML IJ SOLN
INTRAMUSCULAR | Status: AC
  Filled 2021-01-25: qty 1

## 2021-01-25 MED ORDER — KETOROLAC TROMETHAMINE 30 MG/ML IJ SOLN
INTRAMUSCULAR | Status: DC | PRN
  Administered 2021-01-25: 30 mg via INTRAVENOUS

## 2021-01-25 MED ORDER — LIDOCAINE HCL (CARDIAC) PF 100 MG/5ML IV SOSY
PREFILLED_SYRINGE | INTRAVENOUS | Status: DC | PRN
  Administered 2021-01-25: 80 mg via INTRAVENOUS

## 2021-01-25 MED ORDER — ACETAMINOPHEN 10 MG/ML IV SOLN
INTRAVENOUS | Status: AC
  Filled 2021-01-25: qty 100

## 2021-01-25 MED ORDER — ONDANSETRON HCL 4 MG/2ML IJ SOLN
INTRAMUSCULAR | Status: DC | PRN
  Administered 2021-01-25: 4 mg via INTRAVENOUS

## 2021-01-25 MED ORDER — ACETAMINOPHEN 10 MG/ML IV SOLN
INTRAVENOUS | Status: DC | PRN
  Administered 2021-01-25: 1000 mg via INTRAVENOUS

## 2021-01-25 MED ORDER — ONDANSETRON HCL 4 MG/2ML IJ SOLN
INTRAMUSCULAR | Status: AC
  Filled 2021-01-25: qty 2

## 2021-01-25 MED ORDER — PROMETHAZINE HCL 25 MG/ML IJ SOLN: 6.2500 mg | INTRAMUSCULAR | Status: DC | PRN

## 2021-01-25 MED ORDER — LACTATED RINGERS IV SOLN: INTRAVENOUS | Status: DC

## 2021-01-25 MED ORDER — FENTANYL CITRATE (PF) 100 MCG/2ML IJ SOLN
INTRAMUSCULAR | Status: DC | PRN
  Administered 2021-01-25 (×4): 25 ug via INTRAVENOUS

## 2021-01-25 MED ORDER — HYDROCODONE-ACETAMINOPHEN 5-325 MG PO TABS: 1.0000 | ORAL_TABLET | ORAL | 0 refills | Status: AC | PRN

## 2021-01-25 MED ORDER — FAMOTIDINE 20 MG PO TABS
ORAL_TABLET | ORAL | Status: AC
  Administered 2021-01-25: 20 mg via ORAL
  Filled 2021-01-25: qty 1

## 2021-01-25 MED ORDER — GLYCOPYRROLATE 0.2 MG/ML IJ SOLN
INTRAMUSCULAR | Status: AC
  Filled 2021-01-25: qty 1

## 2021-01-25 MED ORDER — STERILE WATER FOR IRRIGATION IR SOLN
Status: DC | PRN
  Administered 2021-01-25: 60 mL

## 2021-01-25 MED ORDER — FENTANYL CITRATE (PF) 100 MCG/2ML IJ SOLN: 25.0000 ug | INTRAMUSCULAR | Status: DC | PRN

## 2021-01-25 MED ORDER — ORAL CARE MOUTH RINSE: 15.0000 mL | Freq: Once | OROMUCOSAL | Status: DC

## 2021-01-25 MED ORDER — TECHNETIUM TC 99M TILMANOCEPT KIT
1.0000 | PACK | Freq: Once | INTRAVENOUS | Status: AC | PRN
  Administered 2021-01-25: 1.055 via INTRADERMAL

## 2021-01-25 MED ORDER — MIDAZOLAM HCL 2 MG/2ML IJ SOLN
INTRAMUSCULAR | Status: AC
  Filled 2021-01-25: qty 2

## 2021-01-25 MED ORDER — CHLORHEXIDINE GLUCONATE 0.12 % MT SOLN
OROMUCOSAL | Status: AC
  Administered 2021-01-25: 15 mL
  Filled 2021-01-25: qty 15

## 2021-01-25 MED ORDER — PROPOFOL 10 MG/ML IV BOLUS
INTRAVENOUS | Status: AC
  Filled 2021-01-25: qty 20

## 2021-01-25 MED ORDER — GLYCOPYRROLATE 0.2 MG/ML IJ SOLN
INTRAMUSCULAR | Status: DC | PRN
  Administered 2021-01-25: .2 mg via INTRAVENOUS

## 2021-01-25 MED ORDER — MIDAZOLAM HCL 2 MG/2ML IJ SOLN
INTRAMUSCULAR | Status: DC | PRN
  Administered 2021-01-25: 2 mg via INTRAVENOUS

## 2021-01-25 MED ORDER — PHENYLEPHRINE HCL (PRESSORS) 10 MG/ML IV SOLN
INTRAVENOUS | Status: DC | PRN
  Administered 2021-01-25 (×5): 80 ug via INTRAVENOUS

## 2021-01-25 MED ORDER — PROPOFOL 10 MG/ML IV BOLUS
INTRAVENOUS | Status: DC | PRN
  Administered 2021-01-25: 160 mg via INTRAVENOUS

## 2021-01-25 MED ORDER — DEXAMETHASONE SODIUM PHOSPHATE 10 MG/ML IJ SOLN
INTRAMUSCULAR | Status: DC | PRN
  Administered 2021-01-25: 10 mg via INTRAVENOUS

## 2021-01-25 MED ORDER — BUPIVACAINE-EPINEPHRINE (PF) 0.5% -1:200000 IJ SOLN
INTRAMUSCULAR | Status: DC | PRN
  Administered 2021-01-25: 30 mL

## 2021-01-25 MED ORDER — CEFAZOLIN SODIUM-DEXTROSE 2-4 GM/100ML-% IV SOLN
2.0000 g | INTRAVENOUS | Status: AC
  Administered 2021-01-25: 2 g via INTRAVENOUS

## 2021-01-25 MED ORDER — FAMOTIDINE 20 MG PO TABS: 20.0000 mg | ORAL_TABLET | Freq: Once | ORAL | Status: AC

## 2021-01-25 SURGICAL SUPPLY — 45 items
ADH SKN CLS APL DERMABOND .7 (GAUZE/BANDAGES/DRESSINGS) ×1
APL PRP STRL LF DISP 70% ISPRP (MISCELLANEOUS) ×1
BLADE SURG 15 STRL LF DISP TIS (BLADE) ×2 IMPLANT
BLADE SURG 15 STRL SS (BLADE) ×4
CHLORAPREP W/TINT 26 (MISCELLANEOUS) ×2 IMPLANT
CNTNR SPEC 2.5X3XGRAD LEK (MISCELLANEOUS)
CONT SPEC 4OZ STER OR WHT (MISCELLANEOUS)
CONT SPEC 4OZ STRL OR WHT (MISCELLANEOUS)
CONTAINER SPEC 2.5X3XGRAD LEK (MISCELLANEOUS) IMPLANT
DERMABOND ADVANCED (GAUZE/BANDAGES/DRESSINGS) ×1
DERMABOND ADVANCED .7 DNX12 (GAUZE/BANDAGES/DRESSINGS) ×1 IMPLANT
DEVICE DUBIN SPECIMEN MAMMOGRA (MISCELLANEOUS) ×2 IMPLANT
DRAPE LAPAROTOMY TRNSV 106X77 (MISCELLANEOUS) ×2 IMPLANT
DRSG GAUZE FLUFF 36X18 (GAUZE/BANDAGES/DRESSINGS) IMPLANT
ELECT CAUTERY BLADE 6.4 (BLADE) ×2 IMPLANT
ELECT REM PT RETURN 9FT ADLT (ELECTROSURGICAL) ×2
ELECTRODE REM PT RTRN 9FT ADLT (ELECTROSURGICAL) ×1 IMPLANT
GAUZE 4X4 16PLY ~~LOC~~+RFID DBL (SPONGE) ×2 IMPLANT
GLOVE SURG ENC MOIS LTX SZ6.5 (GLOVE) ×2 IMPLANT
GLOVE SURG UNDER POLY LF SZ6.5 (GLOVE) ×2 IMPLANT
GOWN STRL REUS W/ TWL LRG LVL3 (GOWN DISPOSABLE) ×2 IMPLANT
GOWN STRL REUS W/TWL LRG LVL3 (GOWN DISPOSABLE) ×4
KIT MARKER MARGIN INK (KITS) ×2 IMPLANT
KIT TURNOVER KIT A (KITS) ×2 IMPLANT
LABEL OR SOLS (LABEL) ×2 IMPLANT
MANIFOLD NEPTUNE II (INSTRUMENTS) ×2 IMPLANT
MARKER MARGIN CORRECT CLIP (MARKER) ×2 IMPLANT
NEEDLE HYPO 22GX1.5 SAFETY (NEEDLE) ×2 IMPLANT
NEEDLE HYPO 25X1 1.5 SAFETY (NEEDLE) ×2 IMPLANT
PACK BASIN MINOR ARMC (MISCELLANEOUS) ×2 IMPLANT
RETRACTOR RING XSMALL (MISCELLANEOUS) ×1 IMPLANT
RTRCTR WOUND ALEXIS 13CM XS SH (MISCELLANEOUS) ×2
SET LOCALIZER 20 PROBE US (MISCELLANEOUS) ×2 IMPLANT
SLEVE PROBE SENORX GAMMA FIND (MISCELLANEOUS) ×2 IMPLANT
SUT MNCRL 4-0 (SUTURE) ×2
SUT MNCRL 4-0 27XMFL (SUTURE) ×1
SUT SILK 2 0 SH (SUTURE) ×2 IMPLANT
SUT VIC AB 3-0 SH 27 (SUTURE) ×2
SUT VIC AB 3-0 SH 27X BRD (SUTURE) ×1 IMPLANT
SUTURE MNCRL 4-0 27XMF (SUTURE) ×1 IMPLANT
SYR 10ML LL (SYRINGE) ×4 IMPLANT
SYR BULB IRRIG 60ML STRL (SYRINGE) ×2 IMPLANT
TRAP NEPTUNE SPECIMEN COLLECT (MISCELLANEOUS) ×2 IMPLANT
WATER STERILE IRR 1000ML POUR (IV SOLUTION) ×2 IMPLANT
WATER STERILE IRR 500ML POUR (IV SOLUTION) ×2 IMPLANT

## 2021-01-25 NOTE — Anesthesia Procedure Notes (Signed)
Procedure Name: LMA Insertion Date/Time: 01/25/2021 9:15 AM Performed by: Jerrye Noble, CRNA Pre-anesthesia Checklist: Patient identified, Emergency Drugs available, Suction available and Patient being monitored Patient Re-evaluated:Patient Re-evaluated prior to induction Oxygen Delivery Method: Circle system utilized Preoxygenation: Pre-oxygenation with 100% oxygen Induction Type: IV induction Ventilation: Mask ventilation without difficulty LMA: LMA inserted LMA Size: 3.5 Number of attempts: 1 Placement Confirmation: positive ETCO2 and breath sounds checked- equal and bilateral Tube secured with: Tape Dental Injury: Teeth and Oropharynx as per pre-operative assessment

## 2021-01-25 NOTE — Discharge Instructions (Addendum)
  Diet: Resume home heart healthy regular diet.   Activity: No heavy lifting >20 pounds (children, pets, laundry, garbage) or strenuous activity until follow-up, but light activity and walking are encouraged. Do not drive or drink alcohol if taking narcotic pain medications.  Wound care: May shower with soapy water and pat dry (do not rub incisions), but no baths or submerging incision underwater until follow-up. (no swimming)   Medications: Resume all home medications. For mild to moderate pain: acetaminophen (Tylenol) ***or ibuprofen (if no kidney disease). Combining Tylenol with alcohol can substantially increase your risk of causing liver disease. Narcotic pain medications, if prescribed, can be used for severe pain, though may cause nausea, constipation, and drowsiness. Do not combine Tylenol and Norco within a 6 hour period as Norco contains Tylenol. If you do not need the narcotic pain medication, you do not need to fill the prescription.  Call office ([phone]) at any time if any questions, worsening pain, fevers/chills, bleeding, drainage from incision site, or other concerns.   AMBULATORY SURGERY  DISCHARGE INSTRUCTIONS   The drugs that you were given will stay in your system until tomorrow so for the next 24 hours you should not:  Drive an automobile Make any legal decisions Drink any alcoholic beverage   You may resume regular meals tomorrow.  Today it is better to start with liquids and gradually work up to solid foods.  You may eat anything you prefer, but it is better to start with liquids, then soup and crackers, and gradually work up to solid foods.   Please notify your doctor immediately if you have any unusual bleeding, trouble breathing, redness and pain at the surgery site, drainage, fever, or pain not relieved by medication.    Additional Instructions:        Please contact your physician with any problems or Same Day Surgery at [phone], Monday  through Friday 6 am to 4 pm, or [place] at [place] Main number at [phone].  

## 2021-01-25 NOTE — Anesthesia Preprocedure Evaluation (Signed)
Anesthesia Evaluation  Patient identified by MRN, date of birth, ID band Patient awake    Reviewed: Allergy & Precautions, NPO status , Patient's Chart, lab work & pertinent test results  History of Anesthesia Complications Negative for: history of anesthetic complications  Airway Mallampati: I  TM Distance: >3 FB Neck ROM: Full    Dental  (+) Teeth Intact, Dental Advidsory Given   Pulmonary neg pulmonary ROS, neg shortness of breath, neg sleep apnea, neg COPD, neg recent URI, Patient abstained from smoking.Not current smoker, former smoker,    Pulmonary exam normal breath sounds clear to auscultation       Cardiovascular Exercise Tolerance: Good METS(-) hypertension(-) angina(-) CAD and (-) Past MI negative cardio ROS  (-) dysrhythmias  Rhythm:Regular Rate:Normal - Systolic murmurs    Neuro/Psych PSYCHIATRIC DISORDERS Anxiety negative neurological ROS     GI/Hepatic neg GERD  ,(+)     (-) substance abuse  ,   Endo/Other  neg diabetes  Renal/GU negative Renal ROS     Musculoskeletal   Abdominal   Peds  Hematology   Anesthesia Other Findings Past Medical History: 09/21/2015: Basal cell carcinoma     Comment:  Left anterior shoulder. Superficial and nodular               patterns. 09/28/2015: Basal cell carcinoma     Comment:  Left spinal upper back. Superficial. 12/06/2016: Basal cell carcinoma     Comment:  Right forearm. Superficial.  12/06/2016: Dysplastic nevus     Comment:  Right spinal mid back. Severe atypia and halo nevus               effect, margin involved. Excised: 02/13/2017. Margins               free 07/04/2017: Dysplastic nevus     Comment:  Right upper back. Moderate atypia, close to margin. No date: Fibrocystic breast No date: Stress incontinence 61/6073: Umbilical hernia  Reproductive/Obstetrics                             Anesthesia Physical  Anesthesia  Plan  ASA: 2  Anesthesia Plan: General   Post-op Pain Management:    Induction: Intravenous  PONV Risk Score and Plan: 3 and Dexamethasone, Ondansetron, Midazolam and Treatment may vary due to age or medical condition  Airway Management Planned: LMA  Additional Equipment: None  Intra-op Plan:   Post-operative Plan: Extubation in OR  Informed Consent: I have reviewed the patients History and Physical, chart, labs and discussed the procedure including the risks, benefits and alternatives for the proposed anesthesia with the patient or authorized representative who has indicated his/her understanding and acceptance.     Dental advisory given  Plan Discussed with: CRNA and Surgeon  Anesthesia Plan Comments: (Discussed risks of anesthesia with patient, including possibility of difficulty with spontaneous ventilation under anesthesia necessitating airway intervention, PONV, and rare risks such as cardiac or respiratory or neurological events, and allergic reactions. Patient understands.)        Anesthesia Quick Evaluation

## 2021-01-25 NOTE — Interval H&P Note (Signed)
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  The various methods of treatment have been discussed with the patient and family. After consideration of risks, benefits and other options for treatment, the patient has consented to  Procedure(s): PART [place] (Left) as a surgical intervention.  The patient's history has been reviewed, patient examined, no change in status, stable for surgery.  I have reviewed the patient's chart and labs.  Questions were answered to the patient's satisfaction.     Herbert Pun

## 2021-01-25 NOTE — Transfer of Care (Signed)
Immediate Anesthesia Transfer of Care Note  Patient: Megan Cummings  Procedure(s) Performed: PART MASTECTOMY,RADIO FREQUENCY LOCALIZER,AXILLARY SENTINEL NODE BIOPSY (Left: Breast)  Patient Location: PACU  Anesthesia Type:General  Level of Consciousness: drowsy and patient cooperative  Airway & Oxygen Therapy: Patient Spontanous Breathing and Patient connected to face mask oxygen  Post-op Assessment: Report given to RN and Post -op Vital signs reviewed and stable  Post vital signs: Reviewed and stable  Last Vitals:  Vitals Value Taken Time  BP 98/49 01/25/21 1033  Temp    Pulse 68 01/25/21 1034  Resp 12 01/25/21 1034  SpO2 100 % 01/25/21 1034  Vitals shown include unvalidated device data.  Last Pain:  Vitals:   01/25/21 0846  TempSrc: Tympanic  PainSc: 0-No pain      Patients Stated Pain Goal: 0 (96/29/52 8413)  Complications: No notable events documented.

## 2021-01-25 NOTE — Op Note (Signed)
Preoperative diagnosis: Left breast carcinoma.  Postoperative diagnosis: Same.   Procedure: Left radiofrequency tag-localized partial mastectomy.                      Left Axillary Sentinel Lymph node biopsy  Anesthesia: GETA  Surgeon: Dr. Windell Moment  Wound Classification: Clean  Indications: Patient is a 45 y.o. female with a nonpalpable left breast mass noted on mammography with core biopsy demonstrating invasive mammary carcinoma requires radiofrequency tag-localized partial mastectomy for treatment with sentinel lymph node biopsy.   Findings: 1. Specimen mammography shows marker and tag on specimen 2. Pathology call refers gross examination of margins was negative. 3. No other palpable mass or lymph node identified.   Description of procedure: Preoperative radiofrequency tag localization was performed by radiology. In the nuclear medicine suite, the subareolar region was injected with Tc-99 sulfur colloid. Localization studies were reviewed. The patient was taken to the operating room and placed supine on the operating table, and after general anesthesia the left chest and axilla were prepped and draped in the usual sterile fashion. A time-out was completed verifying correct patient, procedure, site, positioning, and implant(s) and/or special equipment prior to beginning this procedure.  By comparing the localization studies and interrogation with Localizer device, the probable trajectory and location of the mass was visualized. A circumareolar skin incision was planned in such a way as to minimize the amount of dissection to reach the mass.  The skin incision was made. Flaps were raised and the location of the tag was confirmed with Localizer device confirmed. A 2-0 silk figure-of-eight stay suture was placed and used for retraction. Dissection was then taken down circumferentially, taking care to include the entire localizing tag and a wide margin of grossly normal tissue. The specimen  and entire localizing tag were removed. The specimen was oriented and sent to radiology with the localization studies. Confirmation was received that the entire target lesion had been resected. The wound was irrigated. Hemostasis was checked. The wound was closed with interrupted sutures of 3-0 Vicryl and a subcuticular suture of Monocryl 3-0. No attempt was made to close the dead space.   A hand-held gamma probe was used to identify the location of the hottest spot in the axilla. An incision was made around the caudal axillary hairline. Dissection was carried down until subdermal facias was advanced. The probe was placed and again, the point of maximal count was found. Dissection continue until nodule was identified. The probe was placed in contact with the node. The node was excised in its entirety.  Two additional hot spot was detected and the node was excised in similar fashion. No additional hot spots were identified. No clinically abnormal nodes were palpated. The procedure was terminated. Hemostasis was achieved and the wound closed in layers with deep interrupted 3-0 Vicryl and skin was closed with subcuticular suture of Monocryl 3-0.  The patient tolerated the procedure well and was taken to the postanesthesia care unit in stable condition.   Sentinel Node Biopsy Synoptic Operative Report  Operation performed with curative intent:Yes  Tracer(s) used to identify sentinel nodes in the upfront surgery (non-neoadjuvant) setting (select all that apply):Radioactive Tracer  Tracer(s) used to identify sentinel nodes in the neoadjuvant setting (select all that apply):N/A  All nodes (colored or non-colored) present at the end of a dye-filled lymphatic channel were removed:N/A  All significantly radioactive nodes were removed:Yes  All palpable suspicious nodes were removed:N/A  Biopsy-proven positive nodes marked with clips prior  to chemotherapy were identified and removed:N/A   Specimen: Left  Breast mass                     Sentinel Lymph nodes #1, #2, #3  Complications: None  Estimated Blood Loss: 10 mL

## 2021-01-26 ENCOUNTER — Other Ambulatory Visit: Payer: Self-pay | Admitting: Anatomic Pathology & Clinical Pathology

## 2021-01-26 ENCOUNTER — Encounter: Payer: Self-pay | Admitting: General Surgery

## 2021-01-26 LAB — SURGICAL PATHOLOGY

## 2021-01-26 NOTE — Anesthesia Postprocedure Evaluation (Signed)
Anesthesia Post Note  Patient: Megan Cummings  Procedure(s) Performed: PART MASTECTOMY,RADIO FREQUENCY LOCALIZER,AXILLARY SENTINEL NODE BIOPSY (Left: Breast)  Patient location during evaluation: PACU Anesthesia Type: General Level of consciousness: awake and alert Pain management: pain level controlled Vital Signs Assessment: post-procedure vital signs reviewed and stable Respiratory status: spontaneous breathing, nonlabored ventilation, respiratory function stable and patient connected to nasal cannula oxygen Cardiovascular status: blood pressure returned to baseline and stable Postop Assessment: no apparent nausea or vomiting Anesthetic complications: no   No notable events documented.   Last Vitals:  Vitals:   01/25/21 1107 01/25/21 1121  BP: 137/83 (!) 146/77  Pulse: 63 66  Resp: 14 16  Temp: 36.6 C (!) 36.3 C  SpO2: 100% 100%    Last Pain:  Vitals:   01/25/21 1121  TempSrc: Temporal  PainSc: 2                  Martha Clan

## 2021-02-01 ENCOUNTER — Other Ambulatory Visit: Payer: Self-pay

## 2021-02-01 ENCOUNTER — Telehealth: Payer: Self-pay

## 2021-02-01 ENCOUNTER — Encounter: Payer: Self-pay | Admitting: Oncology

## 2021-02-01 ENCOUNTER — Inpatient Hospital Stay (HOSPITAL_BASED_OUTPATIENT_CLINIC_OR_DEPARTMENT_OTHER): Payer: BC Managed Care – PPO | Admitting: Oncology

## 2021-02-01 DIAGNOSIS — C50912 Malignant neoplasm of unspecified site of left female breast: Secondary | ICD-10-CM

## 2021-02-01 DIAGNOSIS — Z87891 Personal history of nicotine dependence: Secondary | ICD-10-CM | POA: Diagnosis not present

## 2021-02-01 DIAGNOSIS — Z170 Estrogen receptor positive status [ER+]: Secondary | ICD-10-CM | POA: Diagnosis not present

## 2021-02-01 DIAGNOSIS — Z808 Family history of malignant neoplasm of other organs or systems: Secondary | ICD-10-CM | POA: Diagnosis not present

## 2021-02-01 DIAGNOSIS — Z801 Family history of malignant neoplasm of trachea, bronchus and lung: Secondary | ICD-10-CM | POA: Diagnosis not present

## 2021-02-01 DIAGNOSIS — Z7189 Other specified counseling: Secondary | ICD-10-CM | POA: Diagnosis not present

## 2021-02-01 NOTE — Progress Notes (Signed)
Pt c/o left armpit being "more irritated than usual."

## 2021-02-01 NOTE — Progress Notes (Signed)
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  Mammogram showed innumerable cysts in the left breast the largest of which was 1.2 x 1.1 x 1.4 cm.  Targeted ultrasound of the inner left breast showed a hypoechoic mass measuring 1 x 0.8 x 1.9 cm.  No definite lymphadenopathy noted in the left axilla.  No suspicious findings in the right breast.  She has had her last mammogram in 2019 when was again noted to have bilateral breast cysts and subsequent ultrasounds showed benign cysts with no suspicious lesions.Ultrasound-guided biopsy of the left breast mass showed invasive mammary carcinoma 6 mm grade 1 ER 81 to 90% positive PR 91 200% positive and HER2 equivocal by IHC and FISH testing negative   Menarche at the age of 23.  She is G1, P1 L1.  No use of birth control.  No prior hysterectomy.  She is still premenopausal and gets her menstrual cycles regularly.  Family history significant for colon cancer in paternal grandmother and lung cancer or lymphoma in maternal grandfather.  Final lumpectomy pathology showed 13 mm grade 1 invasive mammary carcinoma with negative margins.  3 sentinel lymph nodes negative for malignancy.  No lymphovascular invasion    Interval history-patient covering well from her lumpectomy.  Does report some soreness in her left axilla but otherwise denies other  complaints  ECOG PS- 0 Pain scale- 2   Review of systems- Review of Systems  Constitutional:  Negative for chills, fever, malaise/fatigue and weight loss.  HENT:  Negative for congestion, ear discharge and nosebleeds.   Eyes:  Negative for blurred vision.  Respiratory:  Negative for cough, hemoptysis, sputum production, shortness of breath and wheezing.   Cardiovascular:  Negative for chest pain, palpitations, orthopnea and claudication.  Gastrointestinal:  Negative for abdominal pain, blood in stool, constipation, diarrhea, heartburn, melena, nausea and vomiting.  Genitourinary:  Negative for dysuria, flank pain, frequency, hematuria and urgency.  Musculoskeletal:  Negative for back pain, joint pain and myalgias.  Skin:  Negative for rash.  Neurological:  Negative for dizziness, tingling, focal weakness, seizures, weakness and headaches.  Endo/Heme/Allergies:  Does not bruise/bleed easily.  Psychiatric/Behavioral:  Negative for depression and suicidal ideas. The patient does not have insomnia.     Allergies  Allergen Reactions   Sulfa Antibiotics Rash     Past Medical History:  Diagnosis Date   Basal cell carcinoma 09/21/2015   Left anterior shoulder. Superficial and nodular patterns.   Basal cell carcinoma 09/28/2015   Left spinal upper back. Superficial.   Basal cell carcinoma 12/06/2016   Right forearm. Superficial.    Breast cancer (HCC)    Left   Complication of anesthesia    woke up during colonoscopy   Dysplastic nevus 12/06/2016   Right spinal mid back. Severe atypia and halo nevus effect, margin involved. Excised: 02/13/2017. Margins free   Dysplastic nevus 07/04/2017   Right upper back. Moderate atypia, close to margin.   Fibrocystic breast    History  of kidney stones    h/o   Stress incontinence    Umbilical hernia 45/8099     Past Surgical History:  Procedure Laterality Date   BREAST BIOPSY Left 01/13/2021   [place] bx/ ribbon clip/ path pending    COLONOSCOPY N/A 11/18/2020   Procedure: COLONOSCOPY;  Surgeon: Virgel Manifold, MD;  Location: [facility] ENDOSCOPY;  Service: Endoscopy;  Laterality: N/A;   FOOT SURGERY Right 07/04/2010   PART MASTECTOMY,RADIO FREQUENCY LOCALIZER,AXILLARY SENTINEL NODE BIOPSY Left 01/25/2021   Procedure: PART MASTECTOMY,RADIO FREQUENCY LOCALIZER,AXILLARY SENTINEL NODE BIOPSY;  Surgeon: Herbert Pun, MD;  Location: [facility] ORS;  Service: General;  Laterality: Left;    Social History   Socioeconomic History   Marital status: Married    Spouse name: Not on file   Number of children: Not on file   Years of education: Not on file   Highest education level: Not on file  Occupational History   Not on file  Tobacco Use   Smoking status: Former    Packs/day: 0.50    Years: 10.00    Pack years: 5.00    Types: Cigarettes    Quit date: 2005    Years since quitting: 17.8   Smokeless tobacco: Never  Vaping Use   Vaping Use: Never used  Substance and Sexual Activity   Alcohol use: Yes    Comment: Occasionally drinks wine   Drug use: No   Sexual activity: Yes    Birth control/protection: None  Other Topics Concern   Not on file  Social History Narrative   Not on file   Social Determinants of Health   Financial Resource Strain: Not on file  Food Insecurity: Not on file  Transportation Needs: Not on file  Physical Activity: Not on file  Stress: Not on file  Social Connections: Not on file  Intimate Partner Violence: Not on file    Family History  Problem Relation Age of Onset   Healthy Mother    Hyperlipidemia Father    Healthy Brother    Healthy Son    Healthy Maternal Grandmother    Emphysema Maternal Grandfather    Lung cancer Paternal Grandfather    Colon cancer Paternal Grandmother    Breast cancer Neg Hx      Current Outpatient Medications:    lidocaine-prilocaine (EMLA) cream, Apply topically once., Disp: , Rfl:    Na Sulfate-K Sulfate-Mg Sulf (SUPREP BOWEL PREP KIT)  17.5-3.13-1.6 GM/177ML SOLN, Take 1 kit by mouth as directed., Disp: 354 mL, Rfl: 0  Physical exam:  Physical Exam Constitutional:      General: She is not in acute distress. Cardiovascular:     Rate and Rhythm: Normal rate and regular rhythm.  Pulmonary:     Effort: Pulmonary effort is normal.  Skin:    General: Skin is warm and dry.  Neurological:     Mental Status: She is alert and oriented to person, place, and time.     CMP Latest Ref Rng & Units 01/19/2021  Glucose 70 - 99 mg/dL 71  BUN 6 - 20 mg/dL 17  Creatinine 0.44 - 1.00 mg/dL 0.97  Sodium 135 - 145 mmol/L 137  Potassium 3.5 - 5.1 mmol/L 4.0  Chloride 98 - 111 mmol/L 101  CO2 22 - 32 mmol/L 28  Calcium 8.9 - 10.3 mg/dL 9.4  Total Protein 6.5 - 8.1 g/dL 7.6  Total Bilirubin 0.3 - 1.2 mg/dL 1.0  Alkaline Phos 38 - 126 U/L 45  AST 15 - 41 U/L  14(L)  ALT 0 - 44 U/L 10   CBC Latest Ref Rng & Units 01/19/2021  WBC 4.0 - 10.5 K/uL 9.4  Hemoglobin 12.0 - 15.0 g/dL 13.6  Hematocrit 36.0 - 46.0 % 41.0  Platelets 150 - 400 K/uL 426(H)    No images are attached to the encounter.  NM Sentinel Node Inj-No Rpt (Breast)  Result Date: 01/25/2021 Sulfur Colloid was injected by the Nuclear Medicine Technologist for sentinel lymph node localization.   MM Breast Surgical Specimen  Result Date: 01/25/2021 CLINICAL DATA:  Status post excisional biopsy of the left breast. Specimen radiograph. EXAM: SPECIMEN RADIOGRAPH OF THE LEFT BREAST COMPARISON:  Previous exam(s). FINDINGS: Status post excision of the left breast. The radiofrequency tag and biopsy marker clip are present, completely intact, and were marked for pathology. IMPRESSION: Specimen radiograph of the left breast. Electronically Signed   By: Lillia Mountain M.D.   On: 01/25/2021 10:25  US BREAST LTD UNI LEFT INC AXILLA  Result Date: 01/07/2021 CLINICAL DATA:  45 year old female with focal pain involving the slightly lower outer left breast. EXAM: DIGITAL DIAGNOSTIC  BILATERAL MAMMOGRAM WITH TOMOSYNTHESIS AND CAD; ULTRASOUND LEFT BREAST LIMITED TECHNIQUE: Bilateral digital diagnostic mammography and breast tomosynthesis was performed. The images were evaluated with computer-aided detection.; Targeted ultrasound examination of the left breast was performed. COMPARISON:  Previous exams. ACR Breast Density Category d: The breast tissue is extremely dense, which lowers the sensitivity of mammography. FINDINGS: Spot compression tomograms were performed over the area of pain in the slightly outer left breast demonstrating stable appearance of extremely dense fibroglandular tissue in this location. There is a mass (at least 1 mass) with associated distortion in the inner left breast which persists on the spot compression CC tomograms. Physical examination of the lower inner left breast reveals a firm mass at the approximate 8:30 position. Targeted ultrasound of the site of pain in the lower outer left breast was performed demonstrating innumerable cysts, the largest of which measures 1.2 x 1.1 x 1.4 cm. Targeted ultrasound of the inner left breast was performed demonstrating an irregular shadowing hypoechoic mass at 9 o'clock 1 cm from the nipple measuring 1 x 0.8 x 1.9 cm. No definite lymphadenopathy seen in the left axilla. IMPRESSION: Suspicious 1.9 cm mass in the left breast at the 9 o'clock position. RECOMMENDATION: Recommend ultrasound-guided biopsy of the mass in the left breast at the 9 o'clock position. I have discussed the findings and recommendations with the patient. If applicable, a reminder letter will be sent to the patient regarding the next appointment. BI-RADS CATEGORY  5: Highly suggestive of malignancy. Electronically Signed   By: Everlean Alstrom M.D.   On: 01/07/2021 10:10  MM DIAG BREAST TOMO UNI LEFT  Result Date: 01/20/2021 CLINICAL DATA:  Status post ultrasound-guided RF ID tag placement EXAM: DIAGNOSTIC LEFT MAMMOGRAM POST ULTRASOUND-GUIDED RF ID tag  PLACEMENT COMPARISON:  Previous exam(s). FINDINGS: Mammographic images were obtained following ultrasound-guided RF ID tag placement. These demonstrate RF ID tag immediately adjacent to RIBBON clip at site of biopsy-proven malignancy in the LEFT inner breast at posterior depth. IMPRESSION: Appropriate location of the RF ID tag. Final Assessment: Post Procedure Mammograms for RF ID tag placement Electronically Signed   By: Valentino Saxon M.D.   On: 01/20/2021 16:23  MM DIAG BREAST TOMO BILATERAL  Result Date: 01/07/2021 CLINICAL DATA:  45 year old female with focal pain involving the slightly lower outer left breast. EXAM: DIGITAL DIAGNOSTIC BILATERAL MAMMOGRAM WITH TOMOSYNTHESIS AND CAD; ULTRASOUND LEFT BREAST LIMITED  TECHNIQUE: Bilateral digital diagnostic mammography and breast tomosynthesis was performed. The images were evaluated with computer-aided detection.; Targeted ultrasound examination of the left breast was performed. COMPARISON:  Previous exams. ACR Breast Density Category d: The breast tissue is extremely dense, which lowers the sensitivity of mammography. FINDINGS: Spot compression tomograms were performed over the area of pain in the slightly outer left breast demonstrating stable appearance of extremely dense fibroglandular tissue in this location. There is a mass (at least 1 mass) with associated distortion in the inner left breast which persists on the spot compression CC tomograms. Physical examination of the lower inner left breast reveals a firm mass at the approximate 8:30 position. Targeted ultrasound of the site of pain in the lower outer left breast was performed demonstrating innumerable cysts, the largest of which measures 1.2 x 1.1 x 1.4 cm. Targeted ultrasound of the inner left breast was performed demonstrating an irregular shadowing hypoechoic mass at 9 o'clock 1 cm from the nipple measuring 1 x 0.8 x 1.9 cm. No definite lymphadenopathy seen in the left axilla. IMPRESSION:  Suspicious 1.9 cm mass in the left breast at the 9 o'clock position. RECOMMENDATION: Recommend ultrasound-guided biopsy of the mass in the left breast at the 9 o'clock position. I have discussed the findings and recommendations with the patient. If applicable, a reminder letter will be sent to the patient regarding the next appointment. BI-RADS CATEGORY  5: Highly suggestive of malignancy. Electronically Signed   By: Everlean Alstrom M.D.   On: 01/07/2021 10:10  MM CLIP PLACEMENT LEFT  Result Date: 01/13/2021 CLINICAL DATA:  Status post ultrasound-guided core biopsy of a left breast mass. EXAM: 3D DIAGNOSTIC LEFT MAMMOGRAM POST ULTRASOUND BIOPSY COMPARISON:  Previous exam(s). FINDINGS: 3D Mammographic images were obtained following ultrasound guided biopsy of the left breast. The biopsy marking clip is in expected location in the 9 o'clock region of the left breast. IMPRESSION: Appropriate positioning of the ribbon shaped biopsy marking clip at the site of biopsy in the 9 o'clock region of the left breast. Final Assessment: Post Procedure Mammograms for Marker Placement Electronically Signed   By: Lillia Mountain M.D.   On: 01/13/2021 09:42  [place] LT BREAST BX W LOC DEV 1ST LESION IMG BX SPEC US GUIDE  Addendum Date: 01/14/2021   ADDENDUM REPORT: 01/14/2021 16:31 ADDENDUM: PATHOLOGY revealed: A. LEFT BREAST; ULTRASOUND-GUIDED BIOPSY: - INVASIVE MAMMARY CARCINOMA, NO SPECIAL TYPE. Grade 1. Ductal carcinoma in situ: Present. Lymphovascular invasion: Not identified . Comment: The definitive grade will be assigned on the excisional specimen. Pathology results are CONCORDANT with imaging findings, per Dr. Lillia Mountain. Pathology results and recommendations were discussed with patient via telephone on 01/14/2021. Patient reported biopsy site doing well with no adverse symptoms, and only slight tenderness at the site. Post biopsy care instructions were reviewed, questions were answered and my direct phone number was  provided. Patient was instructed to call [facility] for any additional questions or concerns related to biopsy site. RECOMMENDATIONS: 1. Surgical consultation. Request for surgical consultation relayed to Al Pimple RN at [facility] by Electa Sniff RN on 01/14/2021. 2. Consider bilateral breast MRI given patient's age and presence of extremely dense breast tissue which lowers sensitivity of mammography. Pathology results reported by Electa Sniff RN on 01/14/2021. Electronically Signed   By: Lillia Mountain M.D.   On: 01/14/2021 16:31   Result Date: 01/14/2021 CLINICAL DATA:  Left breast mass. EXAM: ULTRASOUND GUIDED LEFT BREAST CORE NEEDLE BIOPSY COMPARISON:  Previous exam(s).  PROCEDURE: I met with the patient and we discussed the procedure of ultrasound-guided biopsy, including benefits and alternatives. We discussed the high likelihood of a successful procedure. We discussed the risks of the procedure, including infection, bleeding, tissue injury, clip migration, and inadequate sampling. Informed written consent was given. The usual time-out protocol was performed immediately prior to the procedure. Lesion quadrant: 9 o'clock Using sterile technique and 1% Lidocaine as local anesthetic, under direct ultrasound visualization, a 14 gauge spring-loaded device was used to perform biopsy of a mass in the 9 o'clock region of the left breast using a lateral to medial approach. At the conclusion of the procedure ribbon shaped tissue marker clip was deployed into the biopsy cavity. Follow up 2 view mammogram was performed and dictated separately. IMPRESSION: Ultrasound guided biopsy of the left breast. No apparent complications. Electronically Signed: By: Lillia Mountain M.D. On: 01/13/2021 09:28   [place] LT RADIO FREQUENCY TAG LOC US GUIDE  Result Date: 01/20/2021 CLINICAL DATA:  Biopsy-proven LEFT breast cancer (RIBBON). Localization for surgery. EXAM: RF ID tag placement OF THE LEFT BREAST WITH  ULTRASOUND GUIDANCE COMPARISON:  Previous exams. FINDINGS: Patient presents for needle localization prior to LEFT lumpectomy. I met with the patient and we discussed the procedure of needle localization including benefits and alternatives. We discussed the high likelihood of a successful procedure. We discussed the risks of the procedure, including infection, bleeding, tissue injury, and further surgery. Informed, written consent was given. The usual time-out protocol was performed immediately prior to the procedure. Using ultrasound guidance, sterile technique, 1% lidocaine and a 5 cm RF ID tag needle ([id number]), the mass at 9 o'clock 1 cm from the nipple was localized using a medial approach. RF tag function was confirmed with an auditory signal from the radiofrequency guide. The images were marked for Dr. Windell Moment. IMPRESSION: RF ID tag localization of the LEFT breast. No apparent complications. Electronically Signed   By: Valentino Saxon M.D.   On: 01/20/2021 16:22    Assessment and plan- Patient is a 45 y.o. female with pathological prognostic stage Ia invasive mammary carcinoma of the right breast ER/PR positive HER2 negative here to discuss final pathology results and further management  Discussed final pathology results which showed stage I invasive mammary carcinoma 13 mm grade 1 with negative margins.  3 sentinel lymph nodes negative for malignancy.  Tumor is strongly ER/PR positive and HER2 negative.  I would recommend getting an Oncotype testing done at this time.  We will reach out to the patient once the results are back.  Patient is less than 28 years of age and premenopausal.  I would therefore recommend adjuvant chemotherapy if her score is 26 or higher.  Chemotherapy would not be recommended if her score is 11 or lower.  If her score is especially between 13-25 I would consider giving her ovarian suppression plus Arimidex.  Discussed differences between ovarian suppression plus Arimidex  versus tamoxifen.  Discussed risks and benefits of hormone therapy including all but not limited to possible risk of fatigue, hot flashes, mood swings, arthralgias and worsening bone health.  Treatment will be given with a curative intent.  I will also obtain a baseline bone density scan at this time  I will refer the patient to radiation oncology at this time but radiation treatment will start only after Oncotype Dx score is back.  -Tentatively see her back in 1 month's time  Cancer Staging Malignant neoplasm of breast, stage 1, estrogen receptor positive, left ([place])  Staging form: Breast, AJCC 8th Edition - Clinical stage from 01/22/2021: Stage IA (cT1c, cN0, cM0, G1, ER+, PR+, HER2: Equivocal) - Signed by Sindy Guadeloupe, MD on 01/22/2021 Stage prefix: Initial diagnosis Histologic grading system: 3 grade system - Pathologic stage from 02/01/2021: Stage IA (pT1c, pN0, cM0, G1, ER+, PR+, HER2-) - Signed by Sindy Guadeloupe, MD on 02/01/2021 Multigene prognostic tests performed: Oncotype DX Histologic grading system: 3 grade system    Visit Diagnosis 1. Malignant neoplasm of breast, stage 1, estrogen receptor positive, left ([place])   2. Goals of care, counseling/discussion      Dr. Randa Evens, MD, MPH [facility] [phone] 02/01/2021 12:31 PM

## 2021-02-01 NOTE — Telephone Encounter (Signed)
Oncotype order submitted online (Order [id number]) and faxed to [employer] as well as [facility] pathology.

## 2021-02-03 ENCOUNTER — Inpatient Hospital Stay (HOSPITAL_BASED_OUTPATIENT_CLINIC_OR_DEPARTMENT_OTHER): Payer: BC Managed Care – PPO | Admitting: Hospice and Palliative Medicine

## 2021-02-03 DIAGNOSIS — Z170 Estrogen receptor positive status [ER+]: Secondary | ICD-10-CM

## 2021-02-03 DIAGNOSIS — C50912 Malignant neoplasm of unspecified site of left female breast: Secondary | ICD-10-CM

## 2021-02-03 DIAGNOSIS — M79602 Pain in left arm: Secondary | ICD-10-CM | POA: Insufficient documentation

## 2021-02-03 NOTE — Progress Notes (Signed)
Multidisciplinary Oncology Council Documentation  Megan Cummings was presented by our [facility] on 02/03/2021, which included representatives from:  Palliative Care Dietitian  Physical/Occupational Therapist Nurse Navigator Genetics Speech Therapist Social work Survivorship RN Financial Navigator Research RN   Megan Cummings currently presents with history of breast cancer  We reviewed previous medical and familial history, history of present illness, and recent lab results along with all available histopathologic and imaging studies. The [facility] considered available treatment options and made the following recommendations/referrals:  Consider rehab screening and genetics.   The MOC is a meeting of clinicians from various specialty areas who evaluate and discuss patients for whom a multidisciplinary approach is being considered. Final determinations in the plan of care are those of the provider(s).   Today's extended care, comprehensive team conference, Megan Cummings was not present for the discussion and was not examined.

## 2021-02-08 ENCOUNTER — Encounter: Payer: Self-pay | Admitting: Radiation Oncology

## 2021-02-08 ENCOUNTER — Ambulatory Visit
Admission: RE | Admit: 2021-02-08 | Discharge: 2021-02-08 | Disposition: A | Discharge status: Home / Self Care | Payer: BC Managed Care – PPO | Source: Ambulatory Visit | Attending: Radiation Oncology | Admitting: Radiation Oncology

## 2021-02-08 ENCOUNTER — Other Ambulatory Visit: Payer: Self-pay

## 2021-02-08 VITALS — BP 148/91 | HR 70 | Temp 97.3°F | Resp 18 | Wt 154.3 lb

## 2021-02-08 DIAGNOSIS — Z800 Family history of malignant neoplasm of digestive organs: Secondary | ICD-10-CM | POA: Diagnosis not present

## 2021-02-08 DIAGNOSIS — Z87891 Personal history of nicotine dependence: Secondary | ICD-10-CM | POA: Diagnosis not present

## 2021-02-08 DIAGNOSIS — C50312 Malignant neoplasm of lower-inner quadrant of left female breast: Secondary | ICD-10-CM | POA: Insufficient documentation

## 2021-02-08 DIAGNOSIS — C50912 Malignant neoplasm of unspecified site of left female breast: Secondary | ICD-10-CM

## 2021-02-08 DIAGNOSIS — Z801 Family history of malignant neoplasm of trachea, bronchus and lung: Secondary | ICD-10-CM | POA: Diagnosis not present

## 2021-02-08 DIAGNOSIS — Z85828 Personal history of other malignant neoplasm of skin: Secondary | ICD-10-CM | POA: Insufficient documentation

## 2021-02-08 DIAGNOSIS — Z170 Estrogen receptor positive status [ER+]: Secondary | ICD-10-CM | POA: Insufficient documentation

## 2021-02-08 DIAGNOSIS — K429 Umbilical hernia without obstruction or gangrene: Secondary | ICD-10-CM | POA: Insufficient documentation

## 2021-02-08 DIAGNOSIS — Z87442 Personal history of urinary calculi: Secondary | ICD-10-CM | POA: Insufficient documentation

## 2021-02-08 NOTE — Consult Note (Signed)
NEW PATIENT EVALUATION  Name: Megan Cummings  MRN: [id number]  Date:   02/08/2021     DOB: 1976-03-27   This 45 y.o. 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  REFERRING PHYSICIAN: Sindy Guadeloupe, MD  CHIEF COMPLAINT:  Chief Complaint  Patient presents with   New Patient (Initial Visit)    DIAGNOSIS: The encounter diagnosis was Malignant neoplasm of breast, stage 1, estrogen receptor positive, left ([place]).   PREVIOUS INVESTIGATIONS:  Mammogram and ultrasound reviewed Pathology n records reviewed Clinical notes reviewed  HPI: Patient is a 45 year old female who presented with pain in her right breast.  Mammogram confirmed th a 1.9 cm mass in the left breast at the 9 o'clock position.  Highly suggestive of malignancy.  This was confirmed on ultrasound.  Axilla was showing no evidence of definitive lymphadenopathy.  Ultrasound confirmed a invasive mammary carcinoma ER/PR positive HER2/neu not overexpressed.  She went on to have a wide local excision showing overall grade 1 1.3 cm invasive mammary carcinoma.  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  All margins for invasive component were clear at 3 mm.  DCIS margin was 0.5 mm.  3 lymph nodes Sentinel were examined all were negative for metastatic disease.  She has done well postoperatively she seen Dr. Janese Banks and Oncotype DX has been ordered.  She is doing well specifically denies breast tenderness cough or bone pain.  PLANNED TREATMENT REGIMEN: Left hypofractionated whole breast radiation  PAST MEDICAL HISTORY:  has a past medical history of Basal cell carcinoma (09/21/2015), Basal cell carcinoma (09/28/2015), Basal cell carcinoma (12/06/2016), Breast cancer ([employer]), Complication of anesthesia, Dysplastic nevus (12/06/2016), Dysplastic nevus (07/04/2017), Fibrocystic breast, History of kidney stones, Stress  incontinence, and Umbilical hernia (40/9811).    PAST SURGICAL HISTORY:  Past Surgical History:  Procedure Laterality Date   BREAST BIOPSY Left 01/13/2021   [place] bx/ ribbon clip/ path pending   COLONOSCOPY N/A 11/18/2020   Procedure: COLONOSCOPY;  Surgeon: Virgel Manifold, MD;  Location: [facility] ENDOSCOPY;  Service: Endoscopy;  Laterality: N/A;   FOOT SURGERY Right 07/04/2010   PART MASTECTOMY,RADIO FREQUENCY LOCALIZER,AXILLARY SENTINEL NODE BIOPSY Left 01/25/2021   Procedure: PART MASTECTOMY,RADIO FREQUENCY LOCALIZER,AXILLARY SENTINEL NODE BIOPSY;  Surgeon: Herbert Pun, MD;  Location: [facility] ORS;  Service: General;  Laterality: Left;    FAMILY HISTORY: family history includes Colon cancer in her paternal grandmother; Emphysema in her maternal grandfather; Healthy in her brother, maternal grandmother, mother, and son; Hyperlipidemia in her father; Lung cancer in her paternal grandfather.  SOCIAL HISTORY:  reports that she quit smoking about 17 years ago. Her smoking use included cigarettes. She has a 5.00 pack-year smoking history. She has never used smokeless tobacco. She reports current alcohol use. She reports that she does not use drugs.  ALLERGIES: Sulfa antibiotics  MEDICATIONS:  Current Outpatient Medications  Medication Sig Dispense Refill   lidocaine-prilocaine (EMLA) cream Apply topically once. (Patient not taking: Reported on 02/08/2021)     Na Sulfate-K Sulfate-Mg Sulf (SUPREP BOWEL PREP KIT) 17.5-3.13-1.6 GM/177ML SOLN Take 1 kit by mouth as directed. (Patient not taking: Reported on 02/08/2021) 354 mL 0   No current facility-administered medications for this encounter.    ECOG PERFORMANCE STATUS:  0 - Asymptomatic  REVIEW OF SYSTEMS: Patient denies any weight loss, fatigue, weakness, fever, chills or night sweats. Patient denies any loss of vision, blurred vision. Patient denies any  ringing  of the ears or hearing loss. No irregular heartbeat. Patient denies heart  murmur or history of fainting. Patient denies any chest pain or pain radiating to her upper extremities. Patient denies any shortness of breath, difficulty breathing at night, cough or hemoptysis. Patient denies any swelling in the lower legs. Patient denies any nausea vomiting, vomiting of blood, or coffee ground material in the vomitus. Patient denies any stomach pain. Patient states has had normal bowel movements no significant constipation or diarrhea. Patient denies any dysuria, hematuria or significant nocturia. Patient denies any problems walking, swelling in the joints or loss of balance. Patient denies any skin changes, loss of hair or loss of weight. Patient denies any excessive worrying or anxiety or significant depression. Patient denies any problems with insomnia. Patient denies excessive thirst, polyuria, polydipsia. Patient denies any swollen glands, patient denies easy bruising or easy bleeding. Patient denies any recent infections, allergies or URI. Patient "s visual fields have not changed significantly in recent time.   PHYSICAL EXAM: BP (!) 148/91 (BP Location: Right Arm, Patient Position: Sitting, Cuff Size: Normal)   Pulse 70   Temp (!) 97.3 F (36.3 C) (Tympanic)   Resp 18   Wt 154 lb 4.8 oz (70 kg)   SpO2 100%   BMI 22.79 kg/m  She is status post wide local excision of the left breast.  Incision is healing well.  No dominant masses noted in either breast.  No axillary or supraclavicular adenopathy is appreciated.  Well-developed well-nourished patient in NAD. HEENT reveals PERLA, EOMI, discs not visualized.  Oral cavity is clear. No oral mucosal lesions are identified. Neck is clear without evidence of cervical or supraclavicular adenopathy. Lungs are clear to A&P. Cardiac examination is essentially unremarkable with regular rate and rhythm without murmur rub or thrill. Abdomen is benign with no organomegaly or masses noted. Motor sensory and DTR levels are equal and symmetric in  the upper and lower extremities. Cranial nerves II through XII are grossly intact. Proprioception is intact. No peripheral adenopathy or edema is identified. No motor or sensory levels are noted. Crude visual fields are within normal range.  LABORATORY DATA: Pathology report reviewed    RADIOLOGY RESULTS: Mammograms and ultrasound reviewed compatible with above-stated findings.   IMPRESSION: Stage Ia (T1 cN0 M0) ER/PR positive invasive mammary carcinoma the left breast as post wide local excision and sentinel node biopsy in 45 year old female  PLAN: At this time we will wait for Oncotype DX before proceeding to radiation.  I tentatively set up whole breast radiation at the end of next week.  We will plan on a hypofractionated course to her whole breast over 3 weeks we will boost her scar another 1600 centigrade based on the close DCIS margin of 0.5 mm.  Risks and benefits of treatment occluding skin reaction fatigue alteration of blood counts possible occlusion of superficial lung all were reviewed in detail with the patient.  She seems to comprehend my recommendations well.  I personally set up and ordered CT simulation for next week.  We will make sure Oncotype DX is back prior to initiating treatment.  Patient also will benefit from antiestrogen therapy after completion of radiation.  I would like to take this opportunity to thank you for allowing me to participate in the care of your patient.Noreene Filbert, MD

## 2021-02-09 ENCOUNTER — Inpatient Hospital Stay: Payer: BC Managed Care – PPO

## 2021-02-09 ENCOUNTER — Inpatient Hospital Stay: Payer: BC Managed Care – PPO | Admitting: Licensed Clinical Social Worker

## 2021-02-10 DIAGNOSIS — C50912 Malignant neoplasm of unspecified site of left female breast: Secondary | ICD-10-CM | POA: Diagnosis not present

## 2021-02-10 DIAGNOSIS — Z170 Estrogen receptor positive status [ER+]: Secondary | ICD-10-CM | POA: Diagnosis not present

## 2021-02-11 ENCOUNTER — Encounter: Payer: Self-pay | Admitting: Oncology

## 2021-02-11 NOTE — Progress Notes (Signed)
Please let her know no chemotherapy needed. Refer to radiation oncology. See me on 12/2 as planned to discuss further

## 2021-02-12 ENCOUNTER — Telehealth: Payer: Self-pay | Admitting: *Deleted

## 2021-02-12 NOTE — Telephone Encounter (Signed)
Called pt and got her voicemail and let her know that she does not need chemo. The oncotype was 16 and that is low number and does not require chemo. Will need radiation and while I was on the phone I saw that she already has appt for simulation and starting radiation. She should keep her appt with dr rao12/2 and call me if she has questions and left my direct number

## 2021-02-12 NOTE — Telephone Encounter (Signed)
-----   Message from Sindy Guadeloupe, MD sent at 02/11/2021  2:25 PM EST ----- Please let her know no chemotherapy needed. Refer to radiation oncology. See me on 12/2 as planned to discuss further

## 2021-02-15 ENCOUNTER — Ambulatory Visit: Payer: BC Managed Care – PPO | Attending: General Surgery | Admitting: Occupational Therapy

## 2021-02-15 ENCOUNTER — Encounter: Payer: Self-pay | Admitting: Occupational Therapy

## 2021-02-15 DIAGNOSIS — M79602 Pain in left arm: Secondary | ICD-10-CM | POA: Insufficient documentation

## 2021-02-15 DIAGNOSIS — M25612 Stiffness of left shoulder, not elsewhere classified: Secondary | ICD-10-CM | POA: Diagnosis not present

## 2021-02-15 NOTE — Therapy (Signed)
[facility] [address] [place], [address] Phone: [phone]   Fax:  [phone]  Occupational Therapy Evaluation  Patient Details  Name: Megan Cummings MRN: [id number] Date of Birth: 1976-02-13 Referring Provider (OT): Dr Windell Moment   Encounter Date: 02/15/2021   OT End of Session - 02/15/21 [place]     Visit Number 1    Number of Visits 5    Date for OT Re-Evaluation 03/22/21    OT Start Time 1430    OT Stop Time 1512    OT Time Calculation (min) 42 min    Activity Tolerance Patient tolerated treatment well    Behavior During Therapy [facility] for tasks assessed/performed             Past Medical History:  Diagnosis Date   Basal cell carcinoma 09/21/2015   Left anterior shoulder. Superficial and nodular patterns.   Basal cell carcinoma 09/28/2015   Left spinal upper back. Superficial.   Basal cell carcinoma 12/06/2016   Right forearm. Superficial.    Breast cancer ([place])    Left   Complication of anesthesia    woke up during colonoscopy   Dysplastic nevus 12/06/2016   Right spinal mid back. Severe atypia and halo nevus effect, margin involved. Excised: 02/13/2017. Margins free   Dysplastic nevus 07/04/2017   Right upper back. Moderate atypia, close to margin.   Fibrocystic breast    History of kidney stones    h/o   Stress incontinence    Umbilical hernia 53/7482    Past Surgical History:  Procedure Laterality Date   BREAST BIOPSY Left 01/13/2021   [place] bx/ ribbon clip/ path pending   COLONOSCOPY N/A 11/18/2020   Procedure: COLONOSCOPY;  Surgeon: Virgel Manifold, MD;  Location: [facility] ENDOSCOPY;  Service: Endoscopy;  Laterality: N/A;   FOOT SURGERY Right 07/04/2010   PART MASTECTOMY,RADIO FREQUENCY LOCALIZER,AXILLARY SENTINEL NODE BIOPSY Left 01/25/2021   Procedure: PART MASTECTOMY,RADIO FREQUENCY LOCALIZER,AXILLARY SENTINEL NODE BIOPSY;  Surgeon: Herbert Pun, MD;   Location: [facility] ORS;  Service: General;  Laterality: Left;    There were no vitals filed for this visit.   Subjective Assessment - 02/15/21 1805     Subjective  I cannot raise my arm higher than this then it hurts so bad down here in my upper arm and into my elbow to wrist - the pain increase to 8/10  but the swelling I can see is getting better under my arm    Pertinent History Malignant neoplasm of lower-inner quadrant of left female breast, unspecified estrogen receptor status (CMS-HCC) (C50.312)   Stage IA (pT1c, pN0, cM0, G1, ER+, PR+, HER2-)   -S/p left breast partial mastectomy and sentinel needle biopsy on 01/25/2021 by Dr Peyton Najjar  -Patient evaluated by radiation oncology. Radiation therapy starting this week - not going to have  chemotherapy   -She will eventually benefit of antiestrogen therapy  -Due to her pain and tightness on the left forearm, recommendation is to be evaluated byOccupational  therapy.  -Otherwise there is no significant sign of complication at this moment. We will follow closely.  -Patient oriented about Chemo-Port in case she will need chemotherapy.    Patient Stated Goals I want my motion and pain better so I can do cross fit    Currently in Pain? Yes    Pain Score 8     Pain Location Arm    Pain Orientation Left    Pain Descriptors / Indicators Aching;Tender;Tightness  Pain Type Surgical pain    Pain Frequency Intermittent               [facility] OT Assessment - 02/15/21 0001       Assessment   Medical Diagnosis --   S/p left breast partial mastectomy and sentinel needle biopsy on   Referring Provider (OT) Dr Windell Moment    Onset Date/Surgical Date 01/25/21    Hand Dominance Right      Home  Environment   Lives With Family      Prior Function   Vocation --   Full time accountant   Leisure Crossfit - 23 yrs old son - watch sports      AROM   Left Shoulder Flexion 108 Degrees   125 in session   Left Shoulder ABduction 80 Degrees   108 in session              LYMPHEDEMA/ONCOLOGY QUESTIONNAIRE - 02/15/21 0001       Right Upper Extremity Lymphedema   15 cm Proximal to Olecranon Process 29 cm    10 cm Proximal to Olecranon Process 27.5 cm    Olecranon Process 24.7 cm      Left Upper Extremity Lymphedema   15 cm Proximal to Olecranon Process 29 cm    10 cm Proximal to Olecranon Process 28 cm    Olecranon Process 25 cm                            OT Education - 02/15/21 1828     Education Details Finding of eval and HEP    Person(s) Educated Patient    Methods Explanation;Demonstration;Tactile cues;Verbal cues;Handout    Comprehension Returned demonstration;Verbal cues required                 OT Long Term Goals - 02/15/21 1839       OT LONG TERM GOAL #1   Title Pain in L arm decrease to less than 3/10 to do pull over shirt and do hair    Baseline pain 8/10 in L arm at 80 degrees    Time 3    Period Weeks    Status New    Target Date 03/08/21      OT LONG TERM GOAL #2   Title Cording in L UE decrease for pt to show WNL L UE AROM to return to cross fit or ADL's/IADL"S    Baseline Cording in axilla - pain 8/10 to 80 degrees to ABD and Flexion 108 degrees L UE    Time 6    Period Weeks    Status New    Target Date 03/29/21      OT LONG TERM GOAL #3   Title Pt to be independent in HEP to increase L UE AROM without increase symptoms    Baseline NO knowledge of HEP    Time 3    Period Weeks    Status New    Target Date 03/08/21                   Plan - 02/15/21 1832     Clinical Impression Statement Pt present at OT eval  with diagnosis L partial mastectomy 01/25/21 - 3 wks s/p - pt with cording in axilla into upper arm with discomfort reported into elbow- pt pain at 80 degrees of L shoulder ABD and Flexion 108 is 8/10 pain - 3 cords observe in axilla  and pt also tight in pect stretch - done this date some myofascial release on cords -and able to release cords in axilla - and  pt improve in session to 108 ABD and flexion 125 - cont to have some pain on anterior pect into upper arm - done some ice afterwards -pt ed on doing AAROM in supine using wand for shoulder flexion , ABD and ext rotation - also supine stretch for pect gradually with arms out to 45 degrees and move into 90 -with some scar massage - pt to cont at home for week of HEP -and follow up - pt limited by pain and decrease AROM in ADL's and IADl's - pt report swelling decrease under arm - will cont to monitor - pt is starting radiation 02/18/21    OT Occupational Profile and History Problem Focused Assessment - Including review of records relating to presenting problem    Occupational performance deficits (Please refer to evaluation for details): ADL's;IADL's;Play;Leisure;Social Participation    Body Structure / Function / Physical Skills ADL;Decreased knowledge of precautions;Flexibility;Scar mobility;ROM;Edema;Pain    Rehab Potential Good    Clinical Decision Making Limited treatment options, no task modification necessary    Comorbidities Affecting Occupational Performance: None    Modification or Assistance to Complete Evaluation  No modification of tasks or assist necessary to complete eval    OT Frequency 1x / week    OT Duration 6 weeks    OT Treatment/Interventions Self-care/ADL training;Manual lymph drainage;Contrast Bath;Cryotherapy;Manual Therapy;Patient/family education;Passive range of motion;Therapeutic exercise;Scar mobilization    Consulted and Agree with Plan of Care Patient             Patient will benefit from skilled therapeutic intervention in order to improve the following deficits and impairments:   Body Structure / Function / Physical Skills: ADL, Decreased knowledge of precautions, Flexibility, Scar mobility, ROM, Edema, Pain       Visit Diagnosis: Pain in left arm - Plan: Ot plan of care cert/re-cert  Stiffness of left shoulder, not elsewhere classified - Plan: Ot plan of  care cert/re-cert    Problem List Patient Active Problem List   Diagnosis Date Noted   Malignant neoplasm of breast, stage 1, estrogen receptor positive, left ([place]) 01/22/2021   Goals of care, counseling/discussion 01/22/2021   Special screening for malignant neoplasms, colon    Sprain of thumb 25/63/8937   Umbilical hernia 34/28/7681   Personal history of urinary calculi 03/13/2015   Awareness of heartbeats 03/13/2015   Achilles rupture 03/13/2015   Avitaminosis D 08/25/2009   Pure hypercholesterolemia 02/02/2007   Episodic paroxysmal anxiety disorder 02/14/2006    Rosalyn Gess, OTR/L,CLT 02/15/2021, 6:56 PM  [facility] [address] [place], [address] Phone: [phone]   Fax:  [phone]  Name: Megan Cummings MRN: [id number] Date of Birth: 07-May-1975

## 2021-02-18 ENCOUNTER — Ambulatory Visit
Admission: RE | Admit: 2021-02-18 | Discharge: 2021-02-18 | Disposition: A | Discharge status: Home / Self Care | Payer: BC Managed Care – PPO | Source: Ambulatory Visit | Attending: Radiation Oncology | Admitting: Radiation Oncology

## 2021-02-18 DIAGNOSIS — Z170 Estrogen receptor positive status [ER+]: Secondary | ICD-10-CM | POA: Diagnosis not present

## 2021-02-18 DIAGNOSIS — C50312 Malignant neoplasm of lower-inner quadrant of left female breast: Secondary | ICD-10-CM | POA: Diagnosis not present

## 2021-02-18 DIAGNOSIS — C50911 Malignant neoplasm of unspecified site of right female breast: Secondary | ICD-10-CM | POA: Diagnosis not present

## 2021-02-18 DIAGNOSIS — Z510 Encounter for antineoplastic radiation therapy: Secondary | ICD-10-CM | POA: Insufficient documentation

## 2021-02-22 DIAGNOSIS — Z510 Encounter for antineoplastic radiation therapy: Secondary | ICD-10-CM | POA: Diagnosis not present

## 2021-02-22 DIAGNOSIS — C50911 Malignant neoplasm of unspecified site of right female breast: Secondary | ICD-10-CM | POA: Diagnosis not present

## 2021-02-22 DIAGNOSIS — Z170 Estrogen receptor positive status [ER+]: Secondary | ICD-10-CM | POA: Diagnosis not present

## 2021-02-22 DIAGNOSIS — C50312 Malignant neoplasm of lower-inner quadrant of left female breast: Secondary | ICD-10-CM | POA: Diagnosis not present

## 2021-02-23 ENCOUNTER — Inpatient Hospital Stay (HOSPITAL_BASED_OUTPATIENT_CLINIC_OR_DEPARTMENT_OTHER): Payer: BC Managed Care – PPO | Admitting: Licensed Clinical Social Worker

## 2021-02-23 ENCOUNTER — Other Ambulatory Visit: Payer: Self-pay

## 2021-02-23 ENCOUNTER — Encounter: Payer: Self-pay | Admitting: Licensed Clinical Social Worker

## 2021-02-23 ENCOUNTER — Ambulatory Visit: Payer: BC Managed Care – PPO | Admitting: Occupational Therapy

## 2021-02-23 ENCOUNTER — Inpatient Hospital Stay: Payer: BC Managed Care – PPO

## 2021-02-23 DIAGNOSIS — Z170 Estrogen receptor positive status [ER+]: Secondary | ICD-10-CM | POA: Diagnosis not present

## 2021-02-23 DIAGNOSIS — M79602 Pain in left arm: Secondary | ICD-10-CM | POA: Diagnosis not present

## 2021-02-23 DIAGNOSIS — M25612 Stiffness of left shoulder, not elsewhere classified: Secondary | ICD-10-CM

## 2021-02-23 DIAGNOSIS — C50912 Malignant neoplasm of unspecified site of left female breast: Secondary | ICD-10-CM | POA: Diagnosis not present

## 2021-02-23 DIAGNOSIS — Z801 Family history of malignant neoplasm of trachea, bronchus and lung: Secondary | ICD-10-CM | POA: Insufficient documentation

## 2021-02-23 DIAGNOSIS — Z800 Family history of malignant neoplasm of digestive organs: Secondary | ICD-10-CM

## 2021-02-23 NOTE — Progress Notes (Signed)
REFERRING PROVIDER: Sindy Guadeloupe, MD [place] ,  [place] [address]  PRIMARY PROVIDER:  Chrismon, Vickki Muff, PA-C (Inactive)  PRIMARY REASON FOR VISIT:  1. Malignant neoplasm of breast, stage 1, estrogen receptor positive, left ([place])   2. Family history of colon cancer   3. Family history of lung cancer      HISTORY OF PRESENT ILLNESS:   Megan Cummings, a 45 y.o. female, was seen for a [facility] cancer genetics consultation at the request of Dr. Janese Banks due to a personal and family history of cancer.  Megan Cummings presents to clinic today to discuss the possibility of a hereditary predisposition to cancer, genetic testing, and to further clarify her future cancer risks, as well as potential cancer risks for family members.   In 2022, at the age of 64, Megan Cummings was diagnosed with invasive mammary carcinoma of the left breast, ER/PR+, Her2-. The treatment plan includes lumpectomy, completed on 10/24, adjuvant radiation and adjuvant hormone therapy.    CANCER HISTORY:  Oncology History  Malignant neoplasm of breast, stage 1, estrogen receptor positive, left ([place])  01/22/2021 Initial Diagnosis   Malignant neoplasm of breast, stage 1, estrogen receptor positive, left ([place])   01/22/2021 Cancer Staging   Staging form: Breast, AJCC 8th Edition - Clinical stage from 01/22/2021: Stage IA (cT1c, cN0, cM0, G1, ER+, PR+, HER2: Equivocal) - Signed by Sindy Guadeloupe, MD on 01/22/2021 Stage prefix: Initial diagnosis Histologic grading system: 3 grade system    02/01/2021 Cancer Staging   Staging form: Breast, AJCC 8th Edition - Pathologic stage from 02/01/2021: Stage IA (pT1c, pN0, cM0, G1, ER+, PR+, HER2-) - Signed by Sindy Guadeloupe, MD on 02/01/2021 Multigene prognostic tests performed: Oncotype DX Histologic grading system: 3 grade system       RISK FACTORS:  Menarche was at age 1.  First live birth at age 19.  Ovaries intact: yes.  Hysterectomy: no.  Menopausal  status: premenopausal.  HRT use: 0 years. Colonoscopy: yes; normal. Mammogram within the last year: yes. Number of breast biopsies: 1. Up to date with pelvic exams: yes.  Past Medical History:  Diagnosis Date   Basal cell carcinoma 09/21/2015   Left anterior shoulder. Superficial and nodular patterns.   Basal cell carcinoma 09/28/2015   Left spinal upper back. Superficial.   Basal cell carcinoma 12/06/2016   Right forearm. Superficial.    Breast cancer ([place])    Left   Complication of anesthesia    woke up during colonoscopy   Dysplastic nevus 12/06/2016   Right spinal mid back. Severe atypia and halo nevus effect, margin involved. Excised: 02/13/2017. Margins free   Dysplastic nevus 07/04/2017   Right upper back. Moderate atypia, close to margin.   Family history of colon cancer    Family history of lung cancer    Fibrocystic breast    History of kidney stones    h/o   Stress incontinence    Umbilical hernia 52/8413    Past Surgical History:  Procedure Laterality Date   BREAST BIOPSY Left 01/13/2021   [place] bx/ ribbon clip/ path pending   COLONOSCOPY N/A 11/18/2020   Procedure: COLONOSCOPY;  Surgeon: Virgel Manifold, MD;  Location: [facility] ENDOSCOPY;  Service: Endoscopy;  Laterality: N/A;   FOOT SURGERY Right 07/04/2010   PART MASTECTOMY,RADIO FREQUENCY LOCALIZER,AXILLARY SENTINEL NODE BIOPSY Left 01/25/2021   Procedure: PART MASTECTOMY,RADIO FREQUENCY LOCALIZER,AXILLARY SENTINEL NODE BIOPSY;  Surgeon: Herbert Pun, MD;  Location: [facility] ORS;  Service: General;  Laterality:  Left;    Social History   Socioeconomic History   Marital status: Married    Spouse name: Not on file   Number of children: Not on file   Years of education: Not on file   Highest education level: Not on file  Occupational History   Not on file  Tobacco Use   Smoking status: Former    Packs/day: 0.50    Years: 10.00    Pack years: 5.00    Types: Cigarettes    Quit date: 2005     Years since quitting: 17.9   Smokeless tobacco: Never  Vaping Use   Vaping Use: Never used  Substance and Sexual Activity   Alcohol use: Yes    Comment: Occasionally drinks wine   Drug use: No   Sexual activity: Yes    Birth control/protection: None  Other Topics Concern   Not on file  Social History Narrative   Not on file   Social Determinants of Health   Financial Resource Strain: Not on file  Food Insecurity: Not on file  Transportation Needs: Not on file  Physical Activity: Not on file  Stress: Not on file  Social Connections: Not on file     FAMILY HISTORY:  We obtained a detailed, 4-generation family history.  Significant diagnoses are listed below: Family History  Problem Relation Age of Onset   Healthy Mother    Hyperlipidemia Father    Healthy Brother    Lung cancer Maternal Uncle    Healthy Maternal Grandmother    Emphysema Maternal Grandfather    Colon cancer Paternal Grandmother    Lung cancer Paternal Grandfather    Healthy Son    Breast cancer Neg Hx    Megan Cummings has 1 son, 75, no history of cancer. She has 1 brother, 24, no cancer.  Megan Cummings mother is 78, no history of cancer. Patient had 5 maternal aunts, 3 maternal uncles. One uncle had lung cancer. No known cancers in cousins. Maternal grandmother died in her 17s. Grandfather had lung cancer and died in his 9s, history of smoking.   Megan Cummings father is living at 65. Patient has 5 paternal aunts/uncles. None have had cancer. Paternal grandmother had colon cancer in her 31s, passed in her 59s. Grandfather died in his late 49s.   Megan Cummings is unaware of previous family history of genetic testing for hereditary cancer risks. Patient's maternal ancestors are of unknown descent, and paternal ancestors are of unknown descent. There is no reported Ashkenazi Jewish ancestry. There is no known consanguinity.    GENETIC COUNSELING ASSESSMENT: Megan Cummings is a 45 y.o. female with a  personal history of breast cancer which is somewhat suggestive of a hereditary cancer syndrome and predisposition to cancer. We, therefore, discussed and recommended the following at today's visit.   DISCUSSION: We discussed that approximately 10% of breast cancer is hereditary. Most cases of hereditary breast cancer are associated with BRCA1/BRCA2 genes, although there are other genes associated with hereditary breast cancer as well. Cancers and risks are gene specific.  We discussed that testing is beneficial for several reasons including knowing about other cancer risks, identifying potential screening and risk-reduction options that may be appropriate, and to understand if other family members could be at risk for cancer and allow them to undergo genetic testing.   We reviewed the characteristics, features and inheritance patterns of hereditary cancer syndromes. We also discussed genetic testing, including the appropriate family members to test, the process of testing,  insurance coverage and turn-around-time for results. We discussed the implications of a negative, positive and/or variant of uncertain significant result. We recommended Megan Cummings pursue genetic testing for the [employer] CancerNext-Expanded+RNA gene panel.   The CancerNext-Expanded + RNAinsight gene panel offered by [employer] and includes sequencing and rearrangement analysis for the following 77 genes: IP, ALK, APC*, ATM*, AXIN2, BAP1, BARD1, BLM, BMPR1A, BRCA1*, BRCA2*, BRIP1*, CDC73, CDH1*,CDK4, CDKN1B, CDKN2A, CHEK2*, CTNNA1, DICER1, FANCC, FH, FLCN, GALNT12, KIF1B, LZTR1, MAX, MEN1, MET, MLH1*, MSH2*, MSH3, MSH6*, MUTYH*, NBN, NF1*, NF2, NTHL1, PALB2*, PHOX2B, PMS2*, POT1, PRKAR1A, PTCH1, PTEN*, RAD51C*, RAD51D*,RB1, RECQL, RET, SDHA, SDHAF2, SDHB, SDHC, SDHD, SMAD4, SMARCA4, SMARCB1, SMARCE1, STK11, SUFU, TMEM127, TP53*,TSC1, TSC2, VHL and XRCC2 (sequencing and deletion/duplication); EGFR, EGLN1, HOXB13, KIT, MITF, PDGFRA, POLD1  and POLE (sequencing only); EPCAM and GREM1 (deletion/duplication only).  Based on Megan Cummings's personal history of cancer, she meets medical criteria for genetic testing. Despite that she meets criteria, she may still have an out of pocket cost. We discussed that if her out of pocket cost for testing is over $100, the laboratory will call and confirm whether she wants to proceed with testing.  If the out of pocket cost of testing is less than $100 she will be billed by the genetic testing laboratory.   PLAN: After considering the risks, benefits, and limitations, Megan Cummings provided informed consent to pursue genetic testing and the blood sample was sent to [facility] for analysis of the CancerNext-Expanded+RNA panel. Results should be available within approximately 2-3 weeks' time, at which point they will be disclosed by telephone to Megan Cummings, as will any additional recommendations warranted by these results. Megan Cummings will receive a summary of her genetic counseling visit and a copy of her results once available. This information will also be available in Epic.   Megan Cummings questions were answered to her satisfaction today. Our contact information was provided should additional questions or concerns arise. Thank you for the referral and allowing [place] to share in the care of your patient.   Faith Rogue, MS, [facility] Genetic Counselor [email or web address] Phone: [phone]  The patient was seen for a total of 20 minutes in face-to-face genetic counseling.  Patient was seen alone. Dr. Grayland Ormond was available for discussion regarding this case.   _______________________________________________________________________ For Office Staff:  Number of people involved in session: 1 Was an Intern/ student involved with case: no

## 2021-02-23 NOTE — Therapy (Signed)
[facility] [address] [place], [address] Phone: [phone]   Fax:  [phone]  Occupational Therapy Treatment  Patient Details  Name: Megan Cummings MRN: [id number] Date of Birth: 06-01-1975 Referring Provider (OT): Dr Hazle Quant   Encounter Date: 02/23/2021   OT End of Session - 02/23/21 1146     Visit Number 2    Number of Visits 5    Date for OT Re-Evaluation 03/22/21    OT Start Time 0815    OT Stop Time 0900    OT Time Calculation (min) 45 min    Activity Tolerance Patient tolerated treatment well    Behavior During Therapy [facility] for tasks assessed/performed             Past Medical History:  Diagnosis Date   Basal cell carcinoma 09/21/2015   Left anterior shoulder. Superficial and nodular patterns.   Basal cell carcinoma 09/28/2015   Left spinal upper back. Superficial.   Basal cell carcinoma 12/06/2016   Right forearm. Superficial.    Breast cancer ([facility])    Left   Complication of anesthesia    woke up during colonoscopy   Dysplastic nevus 12/06/2016   Right spinal mid back. Severe atypia and halo nevus effect, margin involved. Excised: 02/13/2017. Margins free   Dysplastic nevus 07/04/2017   Right upper back. Moderate atypia, close to margin.   Fibrocystic breast    History of kidney stones    h/o   Stress incontinence    Umbilical hernia 10/2015    Past Surgical History:  Procedure Laterality Date   BREAST BIOPSY Left 01/13/2021   [place] bx/ ribbon clip/ path pending   COLONOSCOPY N/A 11/18/2020   Procedure: COLONOSCOPY;  Surgeon: Pasty Spillers, MD;  Location: [facility] ENDOSCOPY;  Service: Endoscopy;  Laterality: N/A;   FOOT SURGERY Right 07/04/2010   PART MASTECTOMY,RADIO FREQUENCY LOCALIZER,AXILLARY SENTINEL NODE BIOPSY Left 01/25/2021   Procedure: PART MASTECTOMY,RADIO FREQUENCY LOCALIZER,AXILLARY SENTINEL NODE BIOPSY;  Surgeon: Carolan Shiver, MD;   Location: [facility] ORS;  Service: General;  Laterality: Left;    There were no vitals filed for this visit.   Subjective Assessment - 02/23/21 1144     Subjective  I was sore after last time but done some ice and could tell my motion was better the next week -my motion is better but still pull when reaching out here - pull in armpit and bottom part of breast    Pertinent History Malignant neoplasm of lower-inner quadrant of left female breast, unspecified estrogen receptor status (CMS-HCC) (C50.312)   Stage IA (pT1c, pN0, cM0, G1, ER+, PR+, HER2-)   -S/p left breast partial mastectomy and sentinel needle biopsy on 01/25/2021 by Dr Maia Plan  -Patient evaluated by radiation oncology. Radiation therapy starting this week - not going to have  chemotherapy   -She will eventually benefit of antiestrogen therapy  -Due to her pain and tightness on the left forearm, recommendation is to be evaluated byOccupational  therapy.  -Otherwise there is no significant sign of complication at this moment. We will follow closely.  -Patient oriented about Chemo-Port in case she will need chemotherapy.    Patient Stated Goals I want my motion and pain better so I can do cross fit    Currently in Pain? Yes    Pain Score 3     Pain Location Arm   Axilla   Pain Orientation Left    Pain Descriptors / Indicators Tightness;Tender  Pain Type Surgical pain                [facility] OT Assessment - 02/23/21 0001       AROM   Left Shoulder Flexion 135 Degrees    Left Shoulder ABduction 118 Degrees             LYMPHEDEMA/ONCOLOGY QUESTIONNAIRE - 02/23/21 0001       Left Upper Extremity Lymphedema   10 cm Proximal to Olecranon Process 27.5 cm    Olecranon Process 24.5 cm               Supine myofascial release with L arm out at 90 ABD -towel under elbow IN axilla and upper arm  Had 2-3 releases of cord - with increase AROM   Scar massage - ed on doing on 6 to 9 o'clock breast and done by OT in axilla   Nerve flossing done at 90 degrees ABD  Ed on side lying Ext rotation and horizontal ABD  Light graston tool nr 2 sweeping on volar forearm and upper arm  Ed with AAROM on wall for shoulder flexion and ABD  add to HEP  Sidelying and wall HEP                 OT Education - 02/23/21 1146     Education Details progress and changes to HEP    Person(s) Educated Patient    Methods Explanation;Demonstration;Tactile cues;Verbal cues;Handout    Comprehension Returned demonstration;Verbal cues required                 OT Long Term Goals - 02/15/21 1839       OT LONG TERM GOAL #1   Title Pain in L arm decrease to less than 3/10 to do pull over shirt and do hair    Baseline pain 8/10 in L arm at 80 degrees    Time 3    Period Weeks    Status New    Target Date 03/08/21      OT LONG TERM GOAL #2   Title Cording in L UE decrease for pt to show WNL L UE AROM to return to cross fit or ADL's/IADL"S    Baseline Cording in axilla - pain 8/10 to 80 degrees to ABD and Flexion 108 degrees L UE    Time 6    Period Weeks    Status New    Target Date 03/29/21      OT LONG TERM GOAL #3   Title Pt to be independent in HEP to increase L UE AROM without increase symptoms    Baseline NO knowledge of HEP    Time 3    Period Weeks    Status New    Target Date 03/08/21                   Plan - 02/23/21 1149     Clinical Impression Statement Pt present at OT eval  with diagnosis L partial mastectomy 01/25/21 - 4 wks s/p this week - pt with cording in axilla into upper arm with discomfort reported into elbow- pt pain was 8/10 last week at 80 degrees of L shoulder ABD and Flexion 108  - 3 cords observe in axilla and pt also tight in pect stretch - Had great response to treatment with increase AROM -this date return with increase AROM but still limited by 2 cords in axilla and into upper arm and horizontal abd above 90  with ext rotation feels some discomfort- Done again this  date some  myofascial release on cords -and able to release 2 cords in axilla -  done some ice afterwards -pt ed on doing AROM in sidelying for ext rotation and horizontal ABD followed by AAROM on wall for flexion and ABD  - pt to cont at home for week her HEP -and follow up - pt limited by increase pain and stiffness /decrease AROM L shoulder in ADL's and IADl's - pt  swelling decrease in L arm - will cont to monitor - pt is starting radiation 03/01/21    OT Occupational Profile and History Problem Focused Assessment - Including review of records relating to presenting problem    Occupational performance deficits (Please refer to evaluation for details): ADL's;IADL's;Play;Leisure;Social Participation    Body Structure / Function / Physical Skills ADL;Decreased knowledge of precautions;Flexibility;Scar mobility;ROM;Edema;Pain    Rehab Potential Good    Clinical Decision Making Limited treatment options, no task modification necessary    Comorbidities Affecting Occupational Performance: None    Modification or Assistance to Complete Evaluation  No modification of tasks or assist necessary to complete eval    OT Frequency 1x / week    OT Duration 6 weeks    OT Treatment/Interventions Self-care/ADL training;Manual lymph drainage;Contrast Bath;Cryotherapy;Manual Therapy;Patient/family education;Passive range of motion;Therapeutic exercise;Scar mobilization    Consulted and Agree with Plan of Care Patient             Patient will benefit from skilled therapeutic intervention in order to improve the following deficits and impairments:   Body Structure / Function / Physical Skills: ADL, Decreased knowledge of precautions, Flexibility, Scar mobility, ROM, Edema, Pain       Visit Diagnosis: Pain in left arm  Stiffness of left shoulder, not elsewhere classified    Problem List Patient Active Problem List   Diagnosis Date Noted   Malignant neoplasm of breast, stage 1, estrogen receptor  positive, left ([place]) 01/22/2021   Goals of care, counseling/discussion 01/22/2021   Special screening for malignant neoplasms, colon    Sprain of thumb 86/38/1771   Umbilical hernia 16/57/9038   Personal history of urinary calculi 03/13/2015   Awareness of heartbeats 03/13/2015   Achilles rupture 03/13/2015   Avitaminosis D 08/25/2009   Pure hypercholesterolemia 02/02/2007   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 [place], [address] Phone: [phone]   Fax:  [phone]  Name: Megan Cummings MRN: [id number] Date of Birth: 01/07/1976

## 2021-02-24 ENCOUNTER — Other Ambulatory Visit: Payer: Self-pay | Admitting: *Deleted

## 2021-02-24 DIAGNOSIS — Z170 Estrogen receptor positive status [ER+]: Secondary | ICD-10-CM

## 2021-02-24 DIAGNOSIS — C50912 Malignant neoplasm of unspecified site of left female breast: Secondary | ICD-10-CM

## 2021-03-01 ENCOUNTER — Ambulatory Visit: Admission: RE | Admit: 2021-03-01 | Payer: BC Managed Care – PPO | Source: Ambulatory Visit

## 2021-03-01 DIAGNOSIS — Z170 Estrogen receptor positive status [ER+]: Secondary | ICD-10-CM | POA: Diagnosis not present

## 2021-03-01 DIAGNOSIS — Z510 Encounter for antineoplastic radiation therapy: Secondary | ICD-10-CM | POA: Diagnosis not present

## 2021-03-01 DIAGNOSIS — C50911 Malignant neoplasm of unspecified site of right female breast: Secondary | ICD-10-CM | POA: Diagnosis not present

## 2021-03-01 DIAGNOSIS — C50312 Malignant neoplasm of lower-inner quadrant of left female breast: Secondary | ICD-10-CM | POA: Diagnosis not present

## 2021-03-02 ENCOUNTER — Ambulatory Visit
Admission: RE | Admit: 2021-03-02 | Discharge: 2021-03-02 | Disposition: A | Discharge status: Home / Self Care | Payer: BC Managed Care – PPO | Source: Ambulatory Visit | Attending: Radiation Oncology | Admitting: Radiation Oncology

## 2021-03-02 ENCOUNTER — Ambulatory Visit
Admission: RE | Admit: 2021-03-02 | Discharge: 2021-03-02 | Disposition: A | Discharge status: Home / Self Care | Payer: BC Managed Care – PPO | Source: Ambulatory Visit | Attending: Oncology | Admitting: Oncology

## 2021-03-02 ENCOUNTER — Other Ambulatory Visit: Payer: Self-pay

## 2021-03-02 DIAGNOSIS — Z170 Estrogen receptor positive status [ER+]: Secondary | ICD-10-CM

## 2021-03-02 DIAGNOSIS — C50312 Malignant neoplasm of lower-inner quadrant of left female breast: Secondary | ICD-10-CM | POA: Diagnosis not present

## 2021-03-02 DIAGNOSIS — C50912 Malignant neoplasm of unspecified site of left female breast: Secondary | ICD-10-CM | POA: Insufficient documentation

## 2021-03-02 DIAGNOSIS — C50911 Malignant neoplasm of unspecified site of right female breast: Secondary | ICD-10-CM | POA: Diagnosis not present

## 2021-03-02 DIAGNOSIS — Z0389 Encounter for observation for other suspected diseases and conditions ruled out: Secondary | ICD-10-CM | POA: Diagnosis not present

## 2021-03-02 DIAGNOSIS — Z510 Encounter for antineoplastic radiation therapy: Secondary | ICD-10-CM | POA: Diagnosis not present

## 2021-03-03 ENCOUNTER — Inpatient Hospital Stay: Payer: BC Managed Care – PPO | Admitting: Occupational Therapy

## 2021-03-03 ENCOUNTER — Ambulatory Visit
Admission: RE | Admit: 2021-03-03 | Discharge: 2021-03-03 | Disposition: A | Discharge status: Home / Self Care | Payer: BC Managed Care – PPO | Source: Ambulatory Visit | Attending: Radiation Oncology | Admitting: Radiation Oncology

## 2021-03-03 DIAGNOSIS — M79602 Pain in left arm: Secondary | ICD-10-CM

## 2021-03-03 DIAGNOSIS — C50911 Malignant neoplasm of unspecified site of right female breast: Secondary | ICD-10-CM | POA: Diagnosis not present

## 2021-03-03 DIAGNOSIS — C50312 Malignant neoplasm of lower-inner quadrant of left female breast: Secondary | ICD-10-CM | POA: Diagnosis not present

## 2021-03-03 DIAGNOSIS — Z510 Encounter for antineoplastic radiation therapy: Secondary | ICD-10-CM | POA: Diagnosis not present

## 2021-03-03 DIAGNOSIS — Z170 Estrogen receptor positive status [ER+]: Secondary | ICD-10-CM | POA: Diagnosis not present

## 2021-03-03 NOTE — Therapy (Signed)
[facility] [address] [place], [place], [place], [address] Phone: [phone]   Fax:  [phone]  Occupational Therapy Treatment  Patient Details  Name: Megan Cummings MRN: [id number] Date of Birth: 1975-09-25 Referring Provider (OT): Dr Windell Moment   Encounter Date: 03/03/2021   OT End of Session - 03/03/21 1635     Visit Number 3    Number of Visits 5    Date for OT Re-Evaluation 03/22/21    OT Start Time 1108    OT Stop Time 1140    OT Time Calculation (min) 32 min    Activity Tolerance Patient tolerated treatment well    Behavior During Therapy [facility] for tasks assessed/performed             Past Medical History:  Diagnosis Date   Basal cell carcinoma 09/21/2015   Left anterior shoulder. Superficial and nodular patterns.   Basal cell carcinoma 09/28/2015   Left spinal upper back. Superficial.   Basal cell carcinoma 12/06/2016   Right forearm. Superficial.    Breast cancer ([place])    Left   Complication of anesthesia    woke up during colonoscopy   Dysplastic nevus 12/06/2016   Right spinal mid back. Severe atypia and halo nevus effect, margin involved. Excised: 02/13/2017. Margins free   Dysplastic nevus 07/04/2017   Right upper back. Moderate atypia, close to margin.   Family history of colon cancer    Family history of lung cancer    Fibrocystic breast    History of kidney stones    h/o   Stress incontinence    Umbilical hernia 44/3154    Past Surgical History:  Procedure Laterality Date   BREAST BIOPSY Left 01/13/2021   [place] bx/ ribbon clip/ path pending   COLONOSCOPY N/A 11/18/2020   Procedure: COLONOSCOPY;  Surgeon: Virgel Manifold, MD;  Location: [facility] ENDOSCOPY;  Service: Endoscopy;  Laterality: N/A;   FOOT SURGERY Right 07/04/2010   PART MASTECTOMY,RADIO FREQUENCY LOCALIZER,AXILLARY SENTINEL NODE BIOPSY Left 01/25/2021   Procedure: PART MASTECTOMY,RADIO FREQUENCY LOCALIZER,AXILLARY  SENTINEL NODE BIOPSY;  Surgeon: Herbert Pun, MD;  Location: [facility] ORS;  Service: General;  Laterality: Left;    There were no vitals filed for this visit.   Subjective Assessment - 03/03/21 1633     Subjective  Had my 3rd session radiation today - my motion is better and I can tell it is better after everytime you release the cord in my arm -but now I feel my breast more - it pulls in the bottom under the scar when I reach out to the side over head - did I do something to quick after surgery    Pertinent History Malignant neoplasm of lower-inner quadrant of left female breast, unspecified estrogen receptor status (CMS-HCC) (C50.312)   Stage IA (pT1c, pN0, cM0, G1, ER+, PR+, HER2-)   -S/p left breast partial mastectomy and sentinel needle biopsy on 01/25/2021 by Dr Peyton Najjar  -Patient evaluated by radiation oncology. Radiation therapy starting this week - not going to have  chemotherapy   -She will eventually benefit of antiestrogen therapy  -Due to her pain and tightness on the left forearm, recommendation is to be evaluated byOccupational  therapy.  -Otherwise there is no significant sign of complication at this moment. We will follow closely.  -Patient oriented about Chemo-Port in case she will need chemotherapy.    Patient Stated Goals I want my motion and pain better so I can do cross fit  Pt show increase AROM in ABD 128 and flexion 145 end of session Ext rotation and ABD still feel pull in bottom of L breast -under scar - bruising around scar better then last week  from surgery  Held off this am with  myofascial release with L arm  and axilla  One cord visible in axiall and slight pull - pt wanted OT to focus on breast  Showed pt anatomy of pect major , scar , fibrosis and AROM that bother her still   Scar massage  done and soft tissue mobs by OT and mini massager  Review again with pt soft tissue mobs to do at home  And cont with ext rotation  and horizontal ABD  stretches Supine AAROM using golf club  And  AAROM on wall for shoulder flexion and ABD  Pt ed on radiation precautions and cannot do soft tissue on L quadrant with radiation - skin get to tender - stop when feeling increase pain and inflammation               OT Long Term Goals - 02/15/21 1839       OT LONG TERM GOAL #1   Title Pain in L arm decrease to less than 3/10 to do pull over shirt and do hair    Baseline pain 8/10 in L arm at 80 degrees    Time 3    Period Weeks    Status New    Target Date 03/08/21      OT LONG TERM GOAL #2   Title Cording in L UE decrease for pt to show WNL L UE AROM to return to cross fit or ADL's/IADL"S    Baseline Cording in axilla - pain 8/10 to 80 degrees to ABD and Flexion 108 degrees L UE    Time 6    Period Weeks    Status New    Target Date 03/29/21      OT LONG TERM GOAL #3   Title Pt to be independent in HEP to increase L UE AROM without increase symptoms    Baseline NO knowledge of HEP    Time 3    Period Weeks    Status New    Target Date 03/08/21                   Plan - 03/03/21 1635     Clinical Impression Statement Pt present at OT eval  with diagnosis L partial mastectomy 01/25/21 - 5 wks s/p this week - pt with cording in axilla into upper arm with discomfort reported into elbow- pt pain was 8/10 2 wks and at 80 degrees of L shoulder ABD and Flexion 108  - 3 cords observe in axilla and pt also tight in pect stretch -  Pt made great progress in  3 sessions to 128 ABD and flexion 145 - but pain still in ext rotation and ABD -and mostly one cord still in axilla and pull /tightness with pain at bottom 1/2 of breast and under scar- pt ed this date on scar massage and soft tissue mobs to breast to decrease fibrosis - was up to last week still bruised around scar and started radiation 3 days ago. Held off on  myofascial release on cords -done some scar massage and soft tissue mobs to bottom 1/2 of  breast - pt ed on doing it until skin gets tender in the next few days from radiation and will need to stop  then - cont to show increase AROM - pt to cont wight stretch of pect and ext rotation/ABD   - pt to cont at home for week her HEP -and follow up - pt limited by increase pain and stiffness /decrease AROM L shoulder in ADL's and IADl's - pt  swelling decrease in L arm - will cont to monitor - pt is started radiation 03/01/21    OT Occupational Profile and History Problem Focused Assessment - Including review of records relating to presenting problem    Occupational performance deficits (Please refer to evaluation for details): ADL's;IADL's;Play;Leisure;Social Participation    Body Structure / Function / Physical Skills ADL;Decreased knowledge of precautions;Flexibility;Scar mobility;ROM;Edema;Pain    Rehab Potential Good    Clinical Decision Making Limited treatment options, no task modification necessary    Comorbidities Affecting Occupational Performance: None    Modification or Assistance to Complete Evaluation  No modification of tasks or assist necessary to complete eval    OT Frequency 1x / week    OT Duration 6 weeks    OT Treatment/Interventions Self-care/ADL training;Manual lymph drainage;Contrast Bath;Cryotherapy;Manual Therapy;Patient/family education;Passive range of motion;Therapeutic exercise;Scar mobilization    Consulted and Agree with Plan of Care Patient             Patient will benefit from skilled therapeutic intervention in order to improve the following deficits and impairments:   Body Structure / Function / Physical Skills: ADL, Decreased knowledge of precautions, Flexibility, Scar mobility, ROM, Edema, Pain       Visit Diagnosis: Pain in left arm    Problem List Patient Active Problem List   Diagnosis Date Noted   Family history of colon cancer 02/23/2021   Family history of lung cancer 02/23/2021   Malignant neoplasm of breast, stage 1, estrogen  receptor positive, left ([place]) 01/22/2021   Goals of care, counseling/discussion 01/22/2021   Special screening for malignant neoplasms, colon    Sprain of thumb 73/41/9379   Umbilical hernia 02/40/9735   Personal history of urinary calculi 03/13/2015   Awareness of heartbeats 03/13/2015   Achilles rupture 03/13/2015   Avitaminosis D 08/25/2009   Pure hypercholesterolemia 02/02/2007   Episodic paroxysmal anxiety disorder 02/14/2006    Rosalyn Gess, OTR/L,CLT 03/03/2021, 4:41 PM  [facility] [address], [place], [place], [address] Phone: [phone]   Fax:  [phone]  Name: Megan Cummings MRN: [id number] Date of Birth: 06/12/1975

## 2021-03-04 ENCOUNTER — Ambulatory Visit
Admission: RE | Admit: 2021-03-04 | Discharge: 2021-03-04 | Disposition: A | Discharge status: Home / Self Care | Payer: BC Managed Care – PPO | Source: Ambulatory Visit | Attending: Radiation Oncology | Admitting: Radiation Oncology

## 2021-03-04 DIAGNOSIS — M79602 Pain in left arm: Secondary | ICD-10-CM | POA: Diagnosis not present

## 2021-03-04 DIAGNOSIS — C50312 Malignant neoplasm of lower-inner quadrant of left female breast: Secondary | ICD-10-CM | POA: Insufficient documentation

## 2021-03-04 DIAGNOSIS — Z87891 Personal history of nicotine dependence: Secondary | ICD-10-CM | POA: Insufficient documentation

## 2021-03-04 DIAGNOSIS — Z170 Estrogen receptor positive status [ER+]: Secondary | ICD-10-CM | POA: Diagnosis not present

## 2021-03-04 DIAGNOSIS — Z510 Encounter for antineoplastic radiation therapy: Secondary | ICD-10-CM | POA: Insufficient documentation

## 2021-03-04 DIAGNOSIS — Z801 Family history of malignant neoplasm of trachea, bronchus and lung: Secondary | ICD-10-CM | POA: Diagnosis not present

## 2021-03-04 DIAGNOSIS — Z800 Family history of malignant neoplasm of digestive organs: Secondary | ICD-10-CM | POA: Diagnosis not present

## 2021-03-05 ENCOUNTER — Ambulatory Visit
Admission: RE | Admit: 2021-03-05 | Discharge: 2021-03-05 | Disposition: A | Discharge status: Home / Self Care | Payer: BC Managed Care – PPO | Source: Ambulatory Visit | Attending: Radiation Oncology | Admitting: Radiation Oncology

## 2021-03-05 ENCOUNTER — Ambulatory Visit: Payer: BC Managed Care – PPO | Admitting: Oncology

## 2021-03-05 DIAGNOSIS — Z87891 Personal history of nicotine dependence: Secondary | ICD-10-CM | POA: Diagnosis not present

## 2021-03-05 DIAGNOSIS — Z510 Encounter for antineoplastic radiation therapy: Secondary | ICD-10-CM | POA: Diagnosis not present

## 2021-03-05 DIAGNOSIS — C50312 Malignant neoplasm of lower-inner quadrant of left female breast: Secondary | ICD-10-CM | POA: Diagnosis not present

## 2021-03-05 DIAGNOSIS — M79602 Pain in left arm: Secondary | ICD-10-CM | POA: Diagnosis not present

## 2021-03-05 DIAGNOSIS — Z801 Family history of malignant neoplasm of trachea, bronchus and lung: Secondary | ICD-10-CM | POA: Diagnosis not present

## 2021-03-05 DIAGNOSIS — Z800 Family history of malignant neoplasm of digestive organs: Secondary | ICD-10-CM | POA: Diagnosis not present

## 2021-03-05 DIAGNOSIS — Z170 Estrogen receptor positive status [ER+]: Secondary | ICD-10-CM | POA: Diagnosis not present

## 2021-03-08 ENCOUNTER — Ambulatory Visit
Admission: RE | Admit: 2021-03-08 | Discharge: 2021-03-08 | Disposition: A | Discharge status: Home / Self Care | Payer: BC Managed Care – PPO | Source: Ambulatory Visit | Attending: Radiation Oncology | Admitting: Radiation Oncology

## 2021-03-08 ENCOUNTER — Inpatient Hospital Stay (HOSPITAL_BASED_OUTPATIENT_CLINIC_OR_DEPARTMENT_OTHER): Payer: BC Managed Care – PPO | Admitting: Oncology

## 2021-03-08 ENCOUNTER — Other Ambulatory Visit: Payer: Self-pay

## 2021-03-08 ENCOUNTER — Encounter: Payer: Self-pay | Admitting: Oncology

## 2021-03-08 VITALS — BP 138/92 | HR 67 | Temp 98.7°F | Resp 16 | Wt 158.4 lb

## 2021-03-08 DIAGNOSIS — Z510 Encounter for antineoplastic radiation therapy: Secondary | ICD-10-CM | POA: Diagnosis not present

## 2021-03-08 DIAGNOSIS — Z87891 Personal history of nicotine dependence: Secondary | ICD-10-CM | POA: Insufficient documentation

## 2021-03-08 DIAGNOSIS — Z170 Estrogen receptor positive status [ER+]: Secondary | ICD-10-CM | POA: Insufficient documentation

## 2021-03-08 DIAGNOSIS — C50912 Malignant neoplasm of unspecified site of left female breast: Secondary | ICD-10-CM | POA: Insufficient documentation

## 2021-03-08 DIAGNOSIS — Z800 Family history of malignant neoplasm of digestive organs: Secondary | ICD-10-CM | POA: Insufficient documentation

## 2021-03-08 DIAGNOSIS — M79602 Pain in left arm: Secondary | ICD-10-CM | POA: Diagnosis not present

## 2021-03-08 DIAGNOSIS — Z801 Family history of malignant neoplasm of trachea, bronchus and lung: Secondary | ICD-10-CM | POA: Insufficient documentation

## 2021-03-08 DIAGNOSIS — Z7189 Other specified counseling: Secondary | ICD-10-CM | POA: Diagnosis not present

## 2021-03-08 DIAGNOSIS — C50312 Malignant neoplasm of lower-inner quadrant of left female breast: Secondary | ICD-10-CM | POA: Diagnosis not present

## 2021-03-08 NOTE — Progress Notes (Signed)
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  Mammogram showed innumerable cysts in the left breast the largest of which was 1.2 x 1.1 x 1.4 cm.  Targeted ultrasound of the inner left breast showed a hypoechoic mass measuring 1 x 0.8 x 1.9 cm.  No definite lymphadenopathy noted in the left axilla.  No suspicious findings in the right breast.  She has had her last mammogram in 2019 when was again noted to have bilateral breast cysts and subsequent ultrasounds showed benign cysts with no suspicious lesions.Ultrasound-guided biopsy of the left breast mass showed invasive mammary carcinoma 6 mm grade 1 ER 81 to 90% positive PR 91 200% positive and HER2 equivocal by IHC and FISH testing negative   Menarche at the age of 45.  She is G1, P1 L1.  No use of birth control.  No prior hysterectomy.  She is still premenopausal and gets her menstrual cycles regularly.  Family history significant for colon cancer in paternal grandmother and lung cancer or lymphoma in maternal grandfather.   Final lumpectomy pathology showed 13 mm grade 1 invasive mammary carcinoma with negative margins.  3 sentinel lymph nodes negative for malignancy.  No lymphovascular invasion.  Oncotype score came back at 16And chemotherapy benefit for this score in patients less than 47 years of age would be ~1.6%.  Adjuvant  chemotherapy was not recommended.  Patient did receive adjuvant radiation treatment    Interval history-patient has been having occasional emotional outbursts when she starts crying for no clear reason.  She also reports an episode where she was walking down from the stairs and had a sudden episode of palpitations which resolved after a couple of minutes.  She is into weight lifting and during 1 occasion when she was doing bench presses she experienced dizziness when she lifted her head up suddenly.  She will be seeing her primary care provider sometime in the next 2 weeks.  ECOG PS- 0 Pain scale- 0   Review of systems- Review of Systems  Constitutional:  Negative for chills, fever, malaise/fatigue and weight loss.  HENT:  Negative for congestion, ear discharge and nosebleeds.   Eyes:  Negative for blurred vision.  Respiratory:  Negative for cough, hemoptysis, sputum production, shortness of breath and wheezing.   Cardiovascular:  Positive for palpitations. Negative for chest pain, orthopnea and claudication.  Gastrointestinal:  Negative for abdominal pain, blood in stool, constipation, diarrhea, heartburn, melena, nausea and vomiting.  Genitourinary:  Negative for dysuria, flank pain, frequency, hematuria and urgency.  Musculoskeletal:  Negative for back pain, joint pain and myalgias.  Skin:  Negative for rash.  Neurological:  Negative for dizziness, tingling, focal weakness, seizures, weakness and headaches.  Endo/Heme/Allergies:  Does not bruise/bleed easily.  Psychiatric/Behavioral:  Negative for depression and suicidal ideas. The patient is nervous/anxious. The patient does not have insomnia.       Allergies  Allergen Reactions   Sulfa Antibiotics Rash     Past Medical History:  Diagnosis Date   Basal cell carcinoma 09/21/2015   Left anterior shoulder. Superficial and nodular patterns.   Basal cell carcinoma 09/28/2015   Left spinal upper back. Superficial.   Basal cell  carcinoma 12/06/2016   Right forearm. Superficial.    Breast cancer ([place])    Left   Complication of anesthesia    woke up during colonoscopy   Dysplastic nevus 12/06/2016   Right spinal mid back. Severe atypia and halo nevus effect, margin involved. Excised: 02/13/2017. Margins free   Dysplastic nevus 07/04/2017   Right upper back. Moderate atypia, close to margin.   Family history of colon cancer    Family history of lung cancer    Fibrocystic breast    History of kidney stones    h/o   Stress incontinence    Umbilical hernia 30/8657     Past Surgical History:  Procedure Laterality Date   BREAST BIOPSY Left 01/13/2021   [place] bx/ ribbon clip/ path pending   COLONOSCOPY N/A 11/18/2020   Procedure: COLONOSCOPY;  Surgeon: Virgel Manifold, MD;  Location: [facility] ENDOSCOPY;  Service: Endoscopy;  Laterality: N/A;   FOOT SURGERY Right 07/04/2010   PART MASTECTOMY,RADIO FREQUENCY LOCALIZER,AXILLARY SENTINEL NODE BIOPSY Left 01/25/2021   Procedure: PART MASTECTOMY,RADIO FREQUENCY LOCALIZER,AXILLARY SENTINEL NODE BIOPSY;  Surgeon: Herbert Pun, MD;  Location: [facility] ORS;  Service: General;  Laterality: Left;    Social History   Socioeconomic History   Marital status: Married    Spouse name: Not on file   Number of children: Not on file   Years of education: Not on file   Highest education level: Not on file  Occupational History   Not on file  Tobacco Use   Smoking status: Former    Packs/day: 0.50    Years: 10.00    Pack years: 5.00    Types: Cigarettes    Quit date: 2005    Years since quitting: 17.9   Smokeless tobacco: Never  Vaping Use   Vaping Use: Never used  Substance and Sexual Activity   Alcohol use: Yes    Comment: Occasionally drinks wine   Drug use: No   Sexual activity: Yes    Birth control/protection: None  Other Topics Concern   Not on file  Social History Narrative   Not on file   Social Determinants of Health   Financial Resource Strain:  Not on file  Food Insecurity: Not on file  Transportation Needs: Not on file  Physical Activity: Not on file  Stress: Not on file  Social Connections: Not on file  Intimate Partner Violence: Not on file    Family History  Problem Relation Age of Onset   Healthy Mother    Hyperlipidemia Father    Healthy Brother    Lung cancer Maternal Uncle    Healthy Maternal Grandmother    Emphysema Maternal Grandfather    Colon cancer Paternal Grandmother    Lung cancer Paternal Grandfather    Healthy Son    Breast cancer Neg Hx      Current Outpatient Medications:    lidocaine-prilocaine (EMLA) cream, Apply topically once. (Patient not taking: Reported on 02/08/2021), Disp: , Rfl:    Na Sulfate-K Sulfate-Mg Sulf (SUPREP BOWEL PREP KIT) 17.5-3.13-1.6 GM/177ML SOLN, Take 1 kit by mouth as directed. (Patient not taking: Reported on 02/08/2021), Disp: 354 mL, Rfl: 0  Physical exam:  Vitals:   03/08/21 1300  BP: (!) 138/92  Pulse: 67  Resp: 16  Temp: 98.7 F (37.1 C)  SpO2: 100%  Weight: 158 lb 6.4 oz (71.8 kg)   Physical Exam Constitutional:      General: She is not in acute distress. Cardiovascular:     Rate and Rhythm: Normal rate and regular rhythm.     Heart sounds: Normal heart sounds.  Pulmonary:     Effort: Pulmonary effort is normal.  Skin:    General: Skin is warm and dry.  Neurological:     Mental Status: She is alert and oriented to person, place, and time.     CMP Latest Ref Rng & Units 01/19/2021  Glucose 70 - 99 mg/dL 71  BUN 6 - 20 mg/dL 17  Creatinine 0.44 - 1.00 mg/dL 0.97  Sodium 135 - 145 mmol/L 137  Potassium 3.5 - 5.1 mmol/L 4.0  Chloride 98 - 111 mmol/L 101  CO2 22 - 32 mmol/L 28  Calcium 8.9 - 10.3 mg/dL 9.4  Total Protein 6.5 - 8.1 g/dL 7.6  Total Bilirubin 0.3 - 1.2 mg/dL 1.0  Alkaline Phos 38 - 126 U/L 45  AST 15 - 41 U/L 14(L)  ALT 0 - 44 U/L 10   CBC Latest Ref Rng & Units 01/19/2021  WBC 4.0 - 10.5 K/uL 9.4  Hemoglobin 12.0 - 15.0 g/dL  13.6  Hematocrit 36.0 - 46.0 % 41.0  Platelets 150 - 400 K/uL 426(H)    No images are attached to the encounter.  DG Bone Density  Result Date: 03/02/2021 EXAM: DUAL X-RAY ABSORPTIOMETRY (DXA) FOR BONE MINERAL DENSITY IMPRESSION: Your patient Kaylea Mounsey completed a BMD test on 03/02/2021 using the [place] (software version: 14.10) manufactured by [employer]. The following summarizes the results of our evaluation. Technologist: [facility] PATIENT BIOGRAPHICAL: Name: Yarethzi, Branan Patient ID: [id number] Birth Date: [birth year] Height: 69.0 in. Gender: Female Exam Date: 03/02/2021 Weight: 154.3 lbs. Indications: Breast CA, Caucasian, History of Breast Cancer, History of Radiation Fractures: Treatments: DENSITOMETRY RESULTS: Site      Region    Measured Date Measured Age WHO Classification Young Adult T-score BMD         %Change vs. Previous Significant Change (*) AP Spine L1-L4 03/02/2019 47.0 Normal 3.3 1.605 g/cm2 - - DualFemur Neck Left 03/02/2021 47.0 Normal -0.4 0.977 g/cm2 - - ASSESSMENT: The BMD measured at Femur Neck Left is 0.977 g/cm2 with a T-score of -0.4. This patient is considered normal according to [facility]) criteria. The scan quality is good. World Pharmacologist [facility]) criteria for post-menopausal, Caucasian Women: Normal:                   T-score at or above -1 SD Osteopenia/low bone mass: T-score between -1 and -2.5 SD Osteoporosis:             T-score at or below -2.5 SD RECOMMENDATIONS: 1. All patients should optimize calcium and vitamin D intake. 2. Consider FDA-approved medical therapies in postmenopausal women and men aged 59 years and older, based on the following: a. A hip or vertebral(clinical or morphometric) fracture b. T-score < -2.5 at the femoral neck or spine after appropriate evaluation to exclude secondary causes c. Low bone mass (T-score between -1.0 and -2.5 at the femoral neck or spine) and a 10-year probability of  a hip fracture > 3% or a 10-year probability of a major osteoporosis-related fracture > 20% based on the [place]-adapted WHO algorithm 3. Clinician judgment and/or patient preferences may indicate treatment for people with 10-year fracture probabilities above or below  these levels FOLLOW-UP: People with diagnosed cases of osteoporosis or at high risk for fracture should have regular bone mineral density tests. For patients eligible for Medicare, routine testing is allowed once every 2 years. The testing frequency can be increased to one year for patients who have rapidly progressing disease, those who are receiving or discontinuing medical therapy to restore bone mass, or have additional risk factors. I have reviewed this report, and agree with the above findings. [facility] Electronically Signed   By: Rolm Baptise M.D.   On: 03/02/2021 14:11     Assessment and plan- Patient is a 45 y.o. female with pathological prognostic stage Ia invasive mammary carcinoma of the right breast ER/PR positive HER2 negative.  She is here to discuss Oncotype results and further management  Oncotype score came back at 16.  Even if she is premenopausal less than 57 years of age the absolute chemo benefit at this score is about 1.6% and therefore I would not recommend adjuvant chemotherapy.  She is now currently undergoing adjuvant radiation treatment which she will finish in about 5 weeks time.  We discussed that doing ovarian suppression plus aromatase inhibitor is a potential option versus doing tamoxifen alone given her premenopausal status and an Oncotype score of 16.  We discussed risks and benefits of ovarian suppression including all but not limited to mood swings fatigue hot flashes headaches.  Discussed that tamoxifen is typically the drug of choice in premenopausal women for endocrine therapy but ovarian suppression plus Arimidex offers a more potent ovarian suppression and would be reasonable to consider in  her case.  She is willing to proceed with ovarian suppression and if she tolerates it well we will continue doing that on a monthly basis but if she does not we will switch her to tamoxifen at that time.  Patient is completing her radiation treatment on 04/06/2021 and she will receive her first dose of Zoladex on that day and I will tentatively see her end of January for her second dose of Zoladex.  I will tentatively plan to start Arimidex after dose 3 of Zoladex.  Baseline bone density scan was normal.  Treatment will be given with a curative intent     Visit Diagnosis 1. Malignant neoplasm of breast, stage 1, estrogen receptor positive, left ([place])   2. Goals of care, counseling/discussion      Dr. Randa Evens, MD, MPH [facility] at [facility] [phone] 03/08/2021 1:02 PM

## 2021-03-08 NOTE — Progress Notes (Signed)
Reached out to navigator nurse and was told to inform her PCP but appointment is not until 12/22: Pressure on the back of her head and dizziness feelings.

## 2021-03-09 ENCOUNTER — Ambulatory Visit
Admission: RE | Admit: 2021-03-09 | Discharge: 2021-03-09 | Disposition: A | Discharge status: Home / Self Care | Payer: BC Managed Care – PPO | Source: Ambulatory Visit | Attending: Radiation Oncology | Admitting: Radiation Oncology

## 2021-03-09 DIAGNOSIS — Z510 Encounter for antineoplastic radiation therapy: Secondary | ICD-10-CM | POA: Diagnosis not present

## 2021-03-09 DIAGNOSIS — Z800 Family history of malignant neoplasm of digestive organs: Secondary | ICD-10-CM | POA: Diagnosis not present

## 2021-03-09 DIAGNOSIS — C50312 Malignant neoplasm of lower-inner quadrant of left female breast: Secondary | ICD-10-CM | POA: Diagnosis not present

## 2021-03-09 DIAGNOSIS — Z87891 Personal history of nicotine dependence: Secondary | ICD-10-CM | POA: Diagnosis not present

## 2021-03-09 DIAGNOSIS — M79602 Pain in left arm: Secondary | ICD-10-CM | POA: Diagnosis not present

## 2021-03-09 DIAGNOSIS — Z801 Family history of malignant neoplasm of trachea, bronchus and lung: Secondary | ICD-10-CM | POA: Diagnosis not present

## 2021-03-09 DIAGNOSIS — Z170 Estrogen receptor positive status [ER+]: Secondary | ICD-10-CM | POA: Diagnosis not present

## 2021-03-10 ENCOUNTER — Ambulatory Visit: Payer: Self-pay | Admitting: Licensed Clinical Social Worker

## 2021-03-10 ENCOUNTER — Telehealth: Payer: Self-pay | Admitting: Licensed Clinical Social Worker

## 2021-03-10 ENCOUNTER — Ambulatory Visit
Admission: RE | Admit: 2021-03-10 | Discharge: 2021-03-10 | Disposition: A | Discharge status: Home / Self Care | Payer: BC Managed Care – PPO | Source: Ambulatory Visit | Attending: Radiation Oncology | Admitting: Radiation Oncology

## 2021-03-10 ENCOUNTER — Encounter: Payer: Self-pay | Admitting: Licensed Clinical Social Worker

## 2021-03-10 ENCOUNTER — Inpatient Hospital Stay: Payer: BC Managed Care – PPO | Admitting: Occupational Therapy

## 2021-03-10 DIAGNOSIS — Z800 Family history of malignant neoplasm of digestive organs: Secondary | ICD-10-CM | POA: Diagnosis not present

## 2021-03-10 DIAGNOSIS — C50912 Malignant neoplasm of unspecified site of left female breast: Secondary | ICD-10-CM

## 2021-03-10 DIAGNOSIS — C50312 Malignant neoplasm of lower-inner quadrant of left female breast: Secondary | ICD-10-CM | POA: Diagnosis not present

## 2021-03-10 DIAGNOSIS — M79602 Pain in left arm: Secondary | ICD-10-CM | POA: Insufficient documentation

## 2021-03-10 DIAGNOSIS — Z87891 Personal history of nicotine dependence: Secondary | ICD-10-CM | POA: Diagnosis not present

## 2021-03-10 DIAGNOSIS — Z1379 Encounter for other screening for genetic and chromosomal anomalies: Secondary | ICD-10-CM

## 2021-03-10 DIAGNOSIS — Z801 Family history of malignant neoplasm of trachea, bronchus and lung: Secondary | ICD-10-CM

## 2021-03-10 DIAGNOSIS — Z510 Encounter for antineoplastic radiation therapy: Secondary | ICD-10-CM | POA: Diagnosis not present

## 2021-03-10 DIAGNOSIS — Z170 Estrogen receptor positive status [ER+]: Secondary | ICD-10-CM | POA: Diagnosis not present

## 2021-03-10 NOTE — Progress Notes (Signed)
HPI:  Megan Cummings was previously seen in the [facility] clinic due to a personal and family history of cancer and concerns regarding a hereditary predisposition to cancer. Please refer to our prior cancer genetics clinic note for more information regarding our discussion, assessment and recommendations, at the time. Megan Cummings recent genetic test results were disclosed to her, as were recommendations warranted by these results. These results and recommendations are discussed in more detail below.  CANCER HISTORY:  Oncology History  Malignant neoplasm of breast, stage 1, estrogen receptor positive, left ([place])  01/22/2021 Initial Diagnosis   Malignant neoplasm of breast, stage 1, estrogen receptor positive, left ([place])   01/22/2021 Cancer Staging   Staging form: Breast, AJCC 8th Edition - Clinical stage from 01/22/2021: Stage IA (cT1c, cN0, cM0, G1, ER+, PR+, HER2: Equivocal) - Signed by Sindy Guadeloupe, MD on 01/22/2021 Stage prefix: Initial diagnosis Histologic grading system: 3 grade system    02/01/2021 Cancer Staging   Staging form: Breast, AJCC 8th Edition - Pathologic stage from 02/01/2021: Stage IA (pT1c, pN0, cM0, G1, ER+, PR+, HER2-) - Signed by Sindy Guadeloupe, MD on 02/01/2021 Multigene prognostic tests performed: Oncotype DX Histologic grading system: 3 grade system     Genetic Testing   Negative genetic testing. No pathogenic variants identified on the [facility] CancerNext-Expanded+RNA panel. VUS in LZTR1 called c.488C>T and VUS in [facility] called c.67G>A identified. The report date is 03/10/2021.  The CancerNext-Expanded + RNAinsight gene panel offered by [employer] and includes sequencing and rearrangement analysis for the following 77 genes: IP, ALK, APC*, ATM*, AXIN2, BAP1, BARD1, BLM, BMPR1A, BRCA1*, BRCA2*, BRIP1*, CDC73, CDH1*,CDK4, CDKN1B, CDKN2A, CHEK2*, CTNNA1, DICER1, FANCC, FH, FLCN, GALNT12, KIF1B, LZTR1, MAX, MEN1, MET, MLH1*, MSH2*, MSH3, MSH6*,  MUTYH*, NBN, NF1*, NF2, NTHL1, PALB2*, PHOX2B, PMS2*, POT1, PRKAR1A, PTCH1, PTEN*, RAD51C*, RAD51D*,RB1, RECQL, RET, SDHA, SDHAF2, SDHB, SDHC, SDHD, SMAD4, SMARCA4, SMARCB1, SMARCE1, STK11, SUFU, TMEM127, TP53*,TSC1, TSC2, VHL and XRCC2 (sequencing and deletion/duplication); EGFR, EGLN1, HOXB13, KIT, MITF, PDGFRA, POLD1 and POLE (sequencing only); EPCAM and GREM1 (deletion/duplication only).     FAMILY HISTORY:  We obtained a detailed, 4-generation family history.  Significant diagnoses are listed below: Family History  Problem Relation Age of Onset   Healthy Mother    Hyperlipidemia Father    Healthy Brother    Lung cancer Maternal Uncle    Healthy Maternal Grandmother    Emphysema Maternal Grandfather    Colon cancer Paternal Grandmother    Lung cancer Paternal Grandfather    Healthy Son    Breast cancer Neg Hx      Megan Cummings has 1 son, 108, no history of cancer. She has 1 brother, 83, no cancer.   Megan Cummings mother is 38, no history of cancer. Patient had 5 maternal aunts, 3 maternal uncles. One uncle had lung cancer. No known cancers in cousins. Maternal grandmother died in her 76s. Grandfather had lung cancer and died in his 12s, history of smoking.    Megan Cummings father is living at 41. Patient has 5 paternal aunts/uncles. None have had cancer. Paternal grandmother had colon cancer in her 40s, passed in her 24s. Grandfather died in his late 63s.    Megan Cummings is unaware of previous family history of genetic testing for hereditary cancer risks. Patient's maternal ancestors are of unknown descent, and paternal ancestors are of unknown descent. There is no reported Ashkenazi Jewish ancestry. There is no known consanguinity.      GENETIC TEST RESULTS: Genetic  testing reported out on 03/10/2021 through the CancerNext-Expanded+RNA cancer panel found no pathogenic mutations.   The CancerNext-Expanded + RNAinsight gene panel offered by [employer] and includes  sequencing and rearrangement analysis for the following 77 genes: IP, ALK, APC*, ATM*, AXIN2, BAP1, BARD1, BLM, BMPR1A, BRCA1*, BRCA2*, BRIP1*, CDC73, CDH1*,CDK4, CDKN1B, CDKN2A, CHEK2*, CTNNA1, DICER1, FANCC, FH, FLCN, GALNT12, KIF1B, LZTR1, MAX, MEN1, MET, MLH1*, MSH2*, MSH3, MSH6*, MUTYH*, NBN, NF1*, NF2, NTHL1, PALB2*, PHOX2B, PMS2*, POT1, PRKAR1A, PTCH1, PTEN*, RAD51C*, RAD51D*,RB1, RECQL, RET, SDHA, SDHAF2, SDHB, SDHC, SDHD, SMAD4, SMARCA4, SMARCB1, SMARCE1, STK11, SUFU, TMEM127, TP53*,TSC1, TSC2, VHL and XRCC2 (sequencing and deletion/duplication); EGFR, EGLN1, HOXB13, KIT, MITF, PDGFRA, POLD1 and POLE (sequencing only); EPCAM and GREM1 (deletion/duplication only).   The test report has been scanned into EPIC and is located under the Molecular Pathology section of the Results Review tab.  A portion of the result report is included below for reference.     We discussed that because current genetic testing is not perfect, it is possible there may be a gene mutation in one of these genes that current testing cannot detect, but that chance is small.  There could be another gene that has not yet been discovered, or that we have not yet tested, that is responsible for the cancer diagnoses in the family. It is also possible there is a hereditary cause for the cancer in the family that Megan Cummings did not inherit and therefore was not identified in her testing.  Therefore, it is important to remain in touch with cancer genetics in the future so that we can continue to offer Megan Cummings the most up to date genetic testing.   Genetic testing did identify 2 Variants of uncertain significance (VUS) - one in the LZTR1 gene called c.488C>T, a second in the [facility] gene called c.67G>A.  At this time, it is unknown if these variants are associated with increased cancer risk or if they are normal findings, but most variants such as these get reclassified to being inconsequential. They should not be used to make  medical management decisions. With time, we suspect the lab will determine the significance of these variants, if any. If we do learn more about them, we will try to contact Ms. Volpi to discuss it further. However, it is important to stay in touch with [place] periodically and keep the address and phone number up to date.  ADDITIONAL GENETIC TESTING: We discussed with Ms. Salceda that her genetic testing was fairly extensive.  If there are genes identified to increase cancer risk that can be analyzed in the future, we would be happy to discuss and coordinate this testing at that time.    CANCER SCREENING RECOMMENDATIONS: Ms. Spiering's test result is considered negative (normal).  This means that we have not identified a hereditary cause for her  personal and family history of cancer at this time. Most cancers happen by chance and this negative test suggests that her cancer may fall into this category.    While reassuring, this does not definitively rule out a hereditary predisposition to cancer. It is still possible that there could be genetic mutations that are undetectable by current technology. There could be genetic mutations in genes that have not been tested or identified to increase cancer risk.  Therefore, it is recommended she continue to follow the cancer management and screening guidelines provided by her oncology and primary healthcare provider.   An individual's cancer risk and medical management are not determined by genetic test results  alone. Overall cancer risk assessment incorporates additional factors, including personal medical history, family history, and any available genetic information that may result in a personalized plan for cancer prevention and surveillance.  RECOMMENDATIONS FOR FAMILY MEMBERS:  Relatives in this family might be at some increased risk of developing cancer, over the general population risk, simply due to the family history of cancer.  We recommended female  relatives in this family have a yearly mammogram beginning at age 3, or 75 years younger than the earliest onset of cancer, an annual clinical breast exam, and perform monthly breast self-exams. Female relatives in this family should also have a gynecological exam as recommended by their primary provider.  All family members should be referred for colonoscopy starting at age 72.   FOLLOW-UP: Lastly, we discussed with Ms. Stettner that cancer genetics is a rapidly advancing field and it is possible that new genetic tests will be appropriate for her and/or her family members in the future. We encouraged her to remain in contact with cancer genetics on an annual basis so we can update her personal and family histories and let her know of advances in cancer genetics that may benefit this family.   Our contact number was provided. Ms. Parish questions were answered to her satisfaction, and she knows she is welcome to call us at anytime with additional questions or concerns.   Faith Rogue, MS, [facility] Genetic Counselor [email or web address] Phone: [phone]

## 2021-03-10 NOTE — Telephone Encounter (Signed)
Revealed negative genetic testing.  Revealed that a VUS in LZTR1 and SMARB1 was identified. This normal result is reassuring and indicates that it is unlikely Megan Cummings's cancer is due to a hereditary cause.  It is unlikely that there is an increased risk of another cancer due to a mutation in one of these genes.  However, genetic testing is not perfect, and cannot definitively rule out a hereditary cause.  It will be important for her to keep in contact with genetics to learn if any additional testing may be needed in the future.

## 2021-03-11 ENCOUNTER — Ambulatory Visit
Admission: RE | Admit: 2021-03-11 | Discharge: 2021-03-11 | Disposition: A | Discharge status: Home / Self Care | Payer: BC Managed Care – PPO | Source: Ambulatory Visit | Attending: Radiation Oncology | Admitting: Radiation Oncology

## 2021-03-11 ENCOUNTER — Encounter: Payer: Self-pay | Admitting: Oncology

## 2021-03-11 DIAGNOSIS — Z801 Family history of malignant neoplasm of trachea, bronchus and lung: Secondary | ICD-10-CM | POA: Diagnosis not present

## 2021-03-11 DIAGNOSIS — Z800 Family history of malignant neoplasm of digestive organs: Secondary | ICD-10-CM | POA: Diagnosis not present

## 2021-03-11 DIAGNOSIS — Z87891 Personal history of nicotine dependence: Secondary | ICD-10-CM | POA: Diagnosis not present

## 2021-03-11 DIAGNOSIS — M79602 Pain in left arm: Secondary | ICD-10-CM | POA: Diagnosis not present

## 2021-03-11 DIAGNOSIS — C50312 Malignant neoplasm of lower-inner quadrant of left female breast: Secondary | ICD-10-CM | POA: Diagnosis not present

## 2021-03-11 DIAGNOSIS — Z510 Encounter for antineoplastic radiation therapy: Secondary | ICD-10-CM | POA: Diagnosis not present

## 2021-03-11 DIAGNOSIS — Z170 Estrogen receptor positive status [ER+]: Secondary | ICD-10-CM | POA: Diagnosis not present

## 2021-03-11 NOTE — Therapy (Signed)
[facility] [place], [place], [place], [address] Phone: [phone]   Fax:  [phone]  Occupational Therapy Treatment  Patient Details  Name: Megan Cummings MRN: [id number] Date of Birth: 1975-07-30 Referring Provider (OT): Dr Windell Moment   Encounter Date: 03/10/2021   OT End of Session - 03/11/21 1540     Visit Number 4    Number of Visits 5    Date for OT Re-Evaluation 03/22/21    OT Start Time 0905    OT Stop Time 0935    OT Time Calculation (min) 30 min    Activity Tolerance Patient tolerated treatment well    Behavior During Therapy [facility] for tasks assessed/performed             Past Medical History:  Diagnosis Date   Basal cell carcinoma 09/21/2015   Left anterior shoulder. Superficial and nodular patterns.   Basal cell carcinoma 09/28/2015   Left spinal upper back. Superficial.   Basal cell carcinoma 12/06/2016   Right forearm. Superficial.    Breast cancer ([place])    Left   Complication of anesthesia    woke up during colonoscopy   Dysplastic nevus 12/06/2016   Right spinal mid back. Severe atypia and halo nevus effect, margin involved. Excised: 02/13/2017. Margins free   Dysplastic nevus 07/04/2017   Right upper back. Moderate atypia, close to margin.   Family history of colon cancer    Family history of lung cancer    Fibrocystic breast    History of kidney stones    h/o   Stress incontinence    Umbilical hernia 02/9416    Past Surgical History:  Procedure Laterality Date   BREAST BIOPSY Left 01/13/2021   [place] bx/ ribbon clip/ path pending   COLONOSCOPY N/A 11/18/2020   Procedure: COLONOSCOPY;  Surgeon: Virgel Manifold, MD;  Location: [facility] ENDOSCOPY;  Service: Endoscopy;  Laterality: N/A;   FOOT SURGERY Right 07/04/2010   PART MASTECTOMY,RADIO FREQUENCY LOCALIZER,AXILLARY SENTINEL NODE BIOPSY Left 01/25/2021   Procedure: PART MASTECTOMY,RADIO FREQUENCY LOCALIZER,AXILLARY  SENTINEL NODE BIOPSY;  Surgeon: Herbert Pun, MD;  Location: [facility] ORS;  Service: General;  Laterality: Left;    There were no vitals filed for this visit.   Subjective Assessment - 03/11/21 1539     Subjective  I was doing better and motion -but then yesterday I worked out with [employer] - more weight and resp /sets - and today more tight and pulling into my arm to my elbow    Pertinent History Malignant neoplasm of lower-inner quadrant of left female breast, unspecified estrogen receptor status (CMS-HCC) (C50.312)   Stage IA (pT1c, pN0, cM0, G1, ER+, PR+, HER2-)   -S/p left breast partial mastectomy and sentinel needle biopsy on 01/25/2021 by Dr Peyton Najjar  -Patient evaluated by radiation oncology. Radiation therapy starting this week - not going to have  chemotherapy   -She will eventually benefit of antiestrogen therapy  -Due to her pain and tightness on the left forearm, recommendation is to be evaluated byOccupational  therapy.  -Otherwise there is no significant sign of complication at this moment. We will follow closely.  -Patient oriented about Chemo-Port in case she will need chemotherapy.    Patient Stated Goals I want my motion and pain better so I can do cross fit    Currently in Pain? Yes    Pain Score 4     Pain Location Arm    Pain Orientation Left  Pain Descriptors / Indicators Tightness;Tender    Pain Type Surgical pain                 Pt show increase AROM in ABD 128 and flexion 145 coming in today but increase tightness , pull and cording in axilla and anterior shoulder to elbow Ext rotation and ABD still feel pull in bottom of L breast -under scar - bruising around scar better then 2 wks ago from surgery  Attempted myofascial release with L arm  and axilla but to tender - Pt report she done some [employer] weight lifting the day before and increase tightness and  pain this date Pt ed on radiation , tightness and tenderness Pt to focus on AROM and AAROM -and  only strengthening for upper back muscle Showed pt again anatomy of pect major , scar tissue, fibrosis and limitations for AROM that bother her still    Held off soft tissue to breast too - pt now 8th visit of radiation  Chip bag fabrcated and pt to to use if can under light compression for fibrosis on bottom 1/2 of L breast  And cont with ext rotation on wall  Supine AAROM using golf club  And  AAROM on wall for shoulder flexion and ABD  Pt ed on radiation precautions and cannot do soft tissue on L quadrant with radiation - skin get to tender - stop when feeling increase pain and inflammation  Follow up with me next week                  OT Education - 03/11/21 1540     Education Details lymphedema education review and HEP changes    Person(s) Educated Patient    Methods Explanation;Demonstration;Tactile cues;Verbal cues;Handout    Comprehension Returned demonstration;Verbal cues required                        Plan - 03/11/21 1541     Clinical Impression Statement Pt present at OT eval  with diagnosis L partial mastectomy 01/25/21 - 6  wks s/p this week - pt with cording in axilla into upper arm with discomfort reported into elbow- pt pain was 8/10 3  wks and at 80 degrees of L shoulder ABD and Flexion 108  - 3 cords observe in axilla and pt also tight in pect stretch -  Pt made great progress in  3 sessions to 128 ABD and flexion 145 last 2 sessions focus on soft tissue mobs to breast before radiation started and pt ed on scar massage and soft tissue mobs to breast to decrease fibrosis - but now pt to hold off because of radiation and skin to sensitive -recommend chip bag at this time to use if can under some light compression -and pt arrive this date with same AROM than lat week but cording in axilla to elbow- pt report she done some weight lifting in cross fit  yesterday - pt ed on  anatomy of pect , scar tissue, cording and tightness. Pt to focus on AROM  andAAROM - and strengthening for upper back muscle and posture. Pt to cont at home for week her HEP of soft tissue if can tolerate, chip bag , AAROM and AROM - strengthening for upper back only .Pt cont to be limited by increase pain and stiffness /decrease AROM L shoulder in ADL's and IADl's - pt  swelling decrease in L arm - will cont to monitor - pt  is started radiation 03/01/21    OT Occupational Profile and History Problem Focused Assessment - Including review of records relating to presenting problem    Occupational performance deficits (Please refer to evaluation for details): ADL's;IADL's;Play;Leisure;Social Participation    Body Structure / Function / Physical Skills ADL;Decreased knowledge of precautions;Flexibility;Scar mobility;ROM;Edema;Pain    Rehab Potential Good    Clinical Decision Making Limited treatment options, no task modification necessary    Comorbidities Affecting Occupational Performance: None    Modification or Assistance to Complete Evaluation  No modification of tasks or assist necessary to complete eval    OT Frequency 1x / week    OT Duration 6 weeks    OT Treatment/Interventions Self-care/ADL training;Manual lymph drainage;Contrast Bath;Cryotherapy;Manual Therapy;Patient/family education;Passive range of motion;Therapeutic exercise;Scar mobilization    Consulted and Agree with Plan of Care Patient             Patient will benefit from skilled therapeutic intervention in order to improve the following deficits and impairments:   Body Structure / Function / Physical Skills: ADL, Decreased knowledge of precautions, Flexibility, Scar mobility, ROM, Edema, Pain       Visit Diagnosis: Malignant neoplasm of breast, stage 1, estrogen receptor positive, left (HCC)  Pain in left arm    Problem List Patient Active Problem List   Diagnosis Date Noted   Genetic testing 03/10/2021   Family history of colon cancer 02/23/2021   Family history of lung cancer  02/23/2021   Malignant neoplasm of breast, stage 1, estrogen receptor positive, left ([place]) 01/22/2021   Goals of care, counseling/discussion 01/22/2021   Special screening for malignant neoplasms, colon    Sprain of thumb 91/66/0600   Umbilical hernia 45/99/7741   Personal history of urinary calculi 03/13/2015   Awareness of heartbeats 03/13/2015   Achilles rupture 03/13/2015   Avitaminosis D 08/25/2009   Pure hypercholesterolemia 02/02/2007   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 [address] [place], [place], [address] Phone: [phone]   Fax:  [phone]  Name: Megan Cummings MRN: [id number] Date of Birth: July 21, 1975

## 2021-03-12 ENCOUNTER — Ambulatory Visit
Admission: RE | Admit: 2021-03-12 | Discharge: 2021-03-12 | Disposition: A | Discharge status: Home / Self Care | Payer: BC Managed Care – PPO | Source: Ambulatory Visit | Attending: Radiation Oncology | Admitting: Radiation Oncology

## 2021-03-12 DIAGNOSIS — Z510 Encounter for antineoplastic radiation therapy: Secondary | ICD-10-CM | POA: Diagnosis not present

## 2021-03-12 DIAGNOSIS — C50312 Malignant neoplasm of lower-inner quadrant of left female breast: Secondary | ICD-10-CM | POA: Diagnosis not present

## 2021-03-12 DIAGNOSIS — Z170 Estrogen receptor positive status [ER+]: Secondary | ICD-10-CM | POA: Diagnosis not present

## 2021-03-12 DIAGNOSIS — M79602 Pain in left arm: Secondary | ICD-10-CM | POA: Diagnosis not present

## 2021-03-12 DIAGNOSIS — Z801 Family history of malignant neoplasm of trachea, bronchus and lung: Secondary | ICD-10-CM | POA: Diagnosis not present

## 2021-03-12 DIAGNOSIS — Z87891 Personal history of nicotine dependence: Secondary | ICD-10-CM | POA: Diagnosis not present

## 2021-03-12 DIAGNOSIS — Z800 Family history of malignant neoplasm of digestive organs: Secondary | ICD-10-CM | POA: Diagnosis not present

## 2021-03-15 ENCOUNTER — Ambulatory Visit
Admission: RE | Admit: 2021-03-15 | Discharge: 2021-03-15 | Disposition: A | Discharge status: Home / Self Care | Payer: BC Managed Care – PPO | Source: Ambulatory Visit | Attending: Radiation Oncology | Admitting: Radiation Oncology

## 2021-03-15 DIAGNOSIS — M79602 Pain in left arm: Secondary | ICD-10-CM | POA: Diagnosis not present

## 2021-03-15 DIAGNOSIS — Z801 Family history of malignant neoplasm of trachea, bronchus and lung: Secondary | ICD-10-CM | POA: Diagnosis not present

## 2021-03-15 DIAGNOSIS — Z170 Estrogen receptor positive status [ER+]: Secondary | ICD-10-CM | POA: Diagnosis not present

## 2021-03-15 DIAGNOSIS — Z87891 Personal history of nicotine dependence: Secondary | ICD-10-CM | POA: Diagnosis not present

## 2021-03-15 DIAGNOSIS — C50312 Malignant neoplasm of lower-inner quadrant of left female breast: Secondary | ICD-10-CM | POA: Diagnosis not present

## 2021-03-15 DIAGNOSIS — Z800 Family history of malignant neoplasm of digestive organs: Secondary | ICD-10-CM | POA: Diagnosis not present

## 2021-03-15 DIAGNOSIS — Z510 Encounter for antineoplastic radiation therapy: Secondary | ICD-10-CM | POA: Diagnosis not present

## 2021-03-16 ENCOUNTER — Ambulatory Visit
Admission: RE | Admit: 2021-03-16 | Discharge: 2021-03-16 | Disposition: A | Discharge status: Home / Self Care | Payer: BC Managed Care – PPO | Source: Ambulatory Visit | Attending: Radiation Oncology | Admitting: Radiation Oncology

## 2021-03-16 DIAGNOSIS — Z87891 Personal history of nicotine dependence: Secondary | ICD-10-CM | POA: Diagnosis not present

## 2021-03-16 DIAGNOSIS — M79602 Pain in left arm: Secondary | ICD-10-CM | POA: Diagnosis not present

## 2021-03-16 DIAGNOSIS — Z800 Family history of malignant neoplasm of digestive organs: Secondary | ICD-10-CM | POA: Diagnosis not present

## 2021-03-16 DIAGNOSIS — Z510 Encounter for antineoplastic radiation therapy: Secondary | ICD-10-CM | POA: Diagnosis not present

## 2021-03-16 DIAGNOSIS — Z801 Family history of malignant neoplasm of trachea, bronchus and lung: Secondary | ICD-10-CM | POA: Diagnosis not present

## 2021-03-16 DIAGNOSIS — Z170 Estrogen receptor positive status [ER+]: Secondary | ICD-10-CM | POA: Diagnosis not present

## 2021-03-16 DIAGNOSIS — C50312 Malignant neoplasm of lower-inner quadrant of left female breast: Secondary | ICD-10-CM | POA: Diagnosis not present

## 2021-03-17 ENCOUNTER — Ambulatory Visit
Admission: RE | Admit: 2021-03-17 | Discharge: 2021-03-17 | Disposition: A | Discharge status: Home / Self Care | Payer: BC Managed Care – PPO | Source: Ambulatory Visit | Attending: Radiation Oncology | Admitting: Radiation Oncology

## 2021-03-17 ENCOUNTER — Inpatient Hospital Stay: Payer: BC Managed Care – PPO

## 2021-03-17 ENCOUNTER — Inpatient Hospital Stay: Payer: BC Managed Care – PPO | Admitting: Occupational Therapy

## 2021-03-17 ENCOUNTER — Other Ambulatory Visit: Payer: Self-pay

## 2021-03-17 DIAGNOSIS — M79602 Pain in left arm: Secondary | ICD-10-CM | POA: Diagnosis not present

## 2021-03-17 DIAGNOSIS — C50912 Malignant neoplasm of unspecified site of left female breast: Secondary | ICD-10-CM

## 2021-03-17 DIAGNOSIS — Z87891 Personal history of nicotine dependence: Secondary | ICD-10-CM | POA: Diagnosis not present

## 2021-03-17 DIAGNOSIS — Z510 Encounter for antineoplastic radiation therapy: Secondary | ICD-10-CM | POA: Diagnosis not present

## 2021-03-17 DIAGNOSIS — Z170 Estrogen receptor positive status [ER+]: Secondary | ICD-10-CM | POA: Diagnosis not present

## 2021-03-17 DIAGNOSIS — Z800 Family history of malignant neoplasm of digestive organs: Secondary | ICD-10-CM | POA: Diagnosis not present

## 2021-03-17 DIAGNOSIS — C50312 Malignant neoplasm of lower-inner quadrant of left female breast: Secondary | ICD-10-CM | POA: Diagnosis not present

## 2021-03-17 DIAGNOSIS — Z801 Family history of malignant neoplasm of trachea, bronchus and lung: Secondary | ICD-10-CM | POA: Diagnosis not present

## 2021-03-17 LAB — CBC
HCT: 41.3 % (ref 36.0–46.0)
Hemoglobin: 13.7 g/dL (ref 12.0–15.0)
MCH: 30.9 pg (ref 26.0–34.0)
MCHC: 33.2 g/dL (ref 30.0–36.0)
MCV: 93.2 fL (ref 80.0–100.0)
Platelets: 398 10*3/uL (ref 150–400)
RBC: 4.43 MIL/uL (ref 3.87–5.11)
RDW: 12.4 % (ref 11.5–15.5)
WBC: 8.8 10*3/uL (ref 4.0–10.5)
nRBC: 0 % (ref 0.0–0.2)

## 2021-03-17 NOTE — Therapy (Signed)
[facility] [place], [place], [place], [address] Phone: [phone]   Fax:  [phone]  Occupational Therapy Treatment  Patient Details  Name: Megan Cummings MRN: [id number] Date of Birth: 31-Aug-1975 Referring Provider (OT): Dr Windell Moment   Encounter Date: 03/17/2021   OT End of Session - 03/17/21 0949     Visit Number 5    Number of Visits 10    Date for OT Re-Evaluation 06/09/21    OT Start Time 0914    OT Stop Time 0930    OT Time Calculation (min) 16 min    Activity Tolerance Patient tolerated treatment well    Behavior During Therapy [facility] for tasks assessed/performed             Past Medical History:  Diagnosis Date   Basal cell carcinoma 09/21/2015   Left anterior shoulder. Superficial and nodular patterns.   Basal cell carcinoma 09/28/2015   Left spinal upper back. Superficial.   Basal cell carcinoma 12/06/2016   Right forearm. Superficial.    Breast cancer ([place])    Left   Complication of anesthesia    woke up during colonoscopy   Dysplastic nevus 12/06/2016   Right spinal mid back. Severe atypia and halo nevus effect, margin involved. Excised: 02/13/2017. Margins free   Dysplastic nevus 07/04/2017   Right upper back. Moderate atypia, close to margin.   Family history of colon cancer    Family history of lung cancer    Fibrocystic breast    History of kidney stones    h/o   Stress incontinence    Umbilical hernia 42/8768    Past Surgical History:  Procedure Laterality Date   BREAST BIOPSY Left 01/13/2021   [place] bx/ ribbon clip/ path pending   COLONOSCOPY N/A 11/18/2020   Procedure: COLONOSCOPY;  Surgeon: Virgel Manifold, MD;  Location: [facility] ENDOSCOPY;  Service: Endoscopy;  Laterality: N/A;   FOOT SURGERY Right 07/04/2010   PART MASTECTOMY,RADIO FREQUENCY LOCALIZER,AXILLARY SENTINEL NODE BIOPSY Left 01/25/2021   Procedure: PART MASTECTOMY,RADIO FREQUENCY LOCALIZER,AXILLARY  SENTINEL NODE BIOPSY;  Surgeon: Herbert Pun, MD;  Location: [facility] ORS;  Service: General;  Laterality: Left;    There were no vitals filed for this visit.   Subjective Assessment - 03/17/21 0935     Subjective  This week I can tell that my breast is getting more red, irritated and tight under my arm and into my breast- did not wear bra- they did gave me some lotions - feel pull into my forearm and even hand - but upper arm better than last week- cannot do my Crossfit - cannot wea bra    Pertinent History Malignant neoplasm of lower-inner quadrant of left female breast, unspecified estrogen receptor status (CMS-HCC) (C50.312)   Stage IA (pT1c, pN0, cM0, G1, ER+, PR+, HER2-)   -S/p left breast partial mastectomy and sentinel needle biopsy on 01/25/2021 by Dr Peyton Najjar  -Patient evaluated by radiation oncology. Radiation therapy starting this week - not going to have  chemotherapy   -She will eventually benefit of antiestrogen therapy  -Due to her pain and tightness on the left forearm, recommendation is to be evaluated byOccupational  therapy.  -Otherwise there is no significant sign of complication at this moment. We will follow closely.  -Patient oriented about Chemo-Port in case she will need chemotherapy.    Patient Stated Goals I want my motion and pain better so I can do cross fit  LYMPHEDEMA/ONCOLOGY QUESTIONNAIRE - 03/17/21 0001       Right Upper Extremity Lymphedema   15 cm Proximal to Olecranon Process 28.8 cm    10 cm Proximal to Olecranon Process 28 cm    Olecranon Process 24.5 cm      Left Upper Extremity Lymphedema   15 cm Proximal to Olecranon Process 30 cm    10 cm Proximal to Olecranon Process 28 cm    Olecranon Process 24.4 cm                    Pt show increase AROM in ABD 135 and flexion 150  coming in today  with less pull in upper arm and cording in axilla but  increase tightness and pull still from elbow to wrist Ext rotation  and ABD still feel pull in bottom of L breast -under scar .  Increase redness now from radiation - pt to hold off on any soft tissue or chip bag wearing on breast Pt ed on radiation , tightness and tenderness Pt to focus on  PROM and AAROM for L shoulder ABD, flexion  And then also nerve glides in shower - for ABD and ext rotation  Can only do strengthening for upper back muscle    Can do PROM in supine for flexion and ABD or  AAROM on wall for shoulder flexion and ABD  Pt ed on radiation precautions and cannot do soft tissue on L quadrant with radiation - skin get to tender - Follow up in 3-4 wks  Monitor circumference in L UE - compare to R             OT Education - 03/17/21 0949     Education Details lymphedema education review and HEP changes    Methods Explanation;Demonstration;Tactile cues;Verbal cues;Handout    Comprehension Returned demonstration;Verbal cues required                 OT Long Term Goals - 02/15/21 1839       OT LONG TERM GOAL #1   Title Pain in L arm decrease to less than 3/10 to do pull over shirt and do hair    Baseline pain 8/10 in L arm at 80 degrees    Time 3    Period Weeks    Status New    Target Date 03/08/21      OT LONG TERM GOAL #2   Title Cording in L UE decrease for pt to show WNL L UE AROM to return to cross fit or ADL's/IADL"S    Baseline Cording in axilla - pain 8/10 to 80 degrees to ABD and Flexion 108 degrees L UE    Time 6    Period Weeks    Status New    Target Date 03/29/21      OT LONG TERM GOAL #3   Title Pt to be independent in HEP to increase L UE AROM without increase symptoms    Baseline NO knowledge of HEP    Time 3    Period Weeks    Status New    Target Date 03/08/21                   Plan - 03/17/21 0950     Clinical Impression Statement Pt present at OT eval  with diagnosis L partial mastectomy 01/25/21 - Pt's cording in L UE and pain decrease greatly since [facility]- pain was 8/10 and  80  degrees of L  shoulder ABD and Flexion 108. Pt had today her 15th radiation session(started 03/01/21- having 5 wks per pt) and had to stop manual therapy and chip bag use on breast - pt AROM increase greatly since [facility] to flexion 150 and ABD 135 but tight over pect and into arm still - mostly elbow to wrist. Pt ed on focusing now on PROM and stretches for L shoulder and nerve glides- keeping pain under 2/10. And will reassess in about 3-4 wks. Circumference did increase some in the L upper arm but will cont to monitor and reassess in 3-4 wks when radiation is done. Pt to focus on maintaining and increase ROM - doing mostly AAROM and PROM for shoulder. Pt to hold off on any soft tissue mobs or massage because of radiation and skin to sensitive. Can only do strengthening for upper back. Pt cont to be limited by increase pain and stiffness /decrease AROM L shoulder in ADL's and IADl's.    OT Occupational Profile and History Problem Focused Assessment - Including review of records relating to presenting problem    Occupational performance deficits (Please refer to evaluation for details): ADL's;IADL's;Play;Leisure;Social Participation    Body Structure / Function / Physical Skills ADL;Decreased knowledge of precautions;Flexibility;Scar mobility;ROM;Edema;Pain    Rehab Potential Good    Clinical Decision Making Limited treatment options, no task modification necessary    Comorbidities Affecting Occupational Performance: None    Modification or Assistance to Complete Evaluation  No modification of tasks or assist necessary to complete eval    OT Frequency 1x / week    OT Duration 6 weeks    OT Treatment/Interventions Self-care/ADL training;Manual lymph drainage;Contrast Bath;Cryotherapy;Manual Therapy;Patient/family education;Passive range of motion;Therapeutic exercise;Scar mobilization    Consulted and Agree with Plan of Care Patient             Patient will benefit from skilled therapeutic intervention  in order to improve the following deficits and impairments:   Body Structure / Function / Physical Skills: ADL, Decreased knowledge of precautions, Flexibility, Scar mobility, ROM, Edema, Pain       Visit Diagnosis: Pain in left arm    Problem List Patient Active Problem List   Diagnosis Date Noted   Genetic testing 03/10/2021   Family history of colon cancer 02/23/2021   Family history of lung cancer 02/23/2021   Malignant neoplasm of breast, stage 1, estrogen receptor positive, left ([place]) 01/22/2021   Goals of care, counseling/discussion 01/22/2021   Special screening for malignant neoplasms, colon    Sprain of thumb 50/27/7412   Umbilical hernia 87/86/7672   Personal history of urinary calculi 03/13/2015   Awareness of heartbeats 03/13/2015   Achilles rupture 03/13/2015   Avitaminosis D 08/25/2009   Pure hypercholesterolemia 02/02/2007   Episodic paroxysmal anxiety disorder 02/14/2006    Rosalyn Gess, OTR/L,CLT 03/17/2021, 9:58 AM  [facility] [address] [place], [place], [address] Phone: [phone]   Fax:  [phone]  Name: Megan Cummings MRN: [id number] Date of Birth: [birth year]

## 2021-03-18 ENCOUNTER — Ambulatory Visit
Admission: RE | Admit: 2021-03-18 | Discharge: 2021-03-18 | Disposition: A | Discharge status: Home / Self Care | Payer: BC Managed Care – PPO | Source: Ambulatory Visit | Attending: Radiation Oncology | Admitting: Radiation Oncology

## 2021-03-18 ENCOUNTER — Encounter: Payer: Self-pay | Admitting: Oncology

## 2021-03-18 DIAGNOSIS — Z170 Estrogen receptor positive status [ER+]: Secondary | ICD-10-CM | POA: Diagnosis not present

## 2021-03-18 DIAGNOSIS — Z510 Encounter for antineoplastic radiation therapy: Secondary | ICD-10-CM | POA: Diagnosis not present

## 2021-03-18 DIAGNOSIS — Z801 Family history of malignant neoplasm of trachea, bronchus and lung: Secondary | ICD-10-CM | POA: Diagnosis not present

## 2021-03-18 DIAGNOSIS — C50312 Malignant neoplasm of lower-inner quadrant of left female breast: Secondary | ICD-10-CM | POA: Diagnosis not present

## 2021-03-18 DIAGNOSIS — Z87891 Personal history of nicotine dependence: Secondary | ICD-10-CM | POA: Diagnosis not present

## 2021-03-18 DIAGNOSIS — M79602 Pain in left arm: Secondary | ICD-10-CM | POA: Diagnosis not present

## 2021-03-18 DIAGNOSIS — Z800 Family history of malignant neoplasm of digestive organs: Secondary | ICD-10-CM | POA: Diagnosis not present

## 2021-03-19 ENCOUNTER — Ambulatory Visit
Admission: RE | Admit: 2021-03-19 | Discharge: 2021-03-19 | Disposition: A | Discharge status: Home / Self Care | Payer: BC Managed Care – PPO | Source: Ambulatory Visit | Attending: Radiation Oncology | Admitting: Radiation Oncology

## 2021-03-19 DIAGNOSIS — Z87891 Personal history of nicotine dependence: Secondary | ICD-10-CM | POA: Diagnosis not present

## 2021-03-19 DIAGNOSIS — Z801 Family history of malignant neoplasm of trachea, bronchus and lung: Secondary | ICD-10-CM | POA: Diagnosis not present

## 2021-03-19 DIAGNOSIS — Z510 Encounter for antineoplastic radiation therapy: Secondary | ICD-10-CM | POA: Diagnosis not present

## 2021-03-19 DIAGNOSIS — C50312 Malignant neoplasm of lower-inner quadrant of left female breast: Secondary | ICD-10-CM | POA: Diagnosis not present

## 2021-03-19 DIAGNOSIS — M79602 Pain in left arm: Secondary | ICD-10-CM | POA: Diagnosis not present

## 2021-03-19 DIAGNOSIS — Z800 Family history of malignant neoplasm of digestive organs: Secondary | ICD-10-CM | POA: Diagnosis not present

## 2021-03-19 DIAGNOSIS — Z170 Estrogen receptor positive status [ER+]: Secondary | ICD-10-CM | POA: Diagnosis not present

## 2021-03-22 ENCOUNTER — Ambulatory Visit
Admission: RE | Admit: 2021-03-22 | Discharge: 2021-03-22 | Disposition: A | Discharge status: Home / Self Care | Payer: BC Managed Care – PPO | Source: Ambulatory Visit | Attending: Radiation Oncology | Admitting: Radiation Oncology

## 2021-03-22 DIAGNOSIS — Z87891 Personal history of nicotine dependence: Secondary | ICD-10-CM | POA: Diagnosis not present

## 2021-03-22 DIAGNOSIS — M79602 Pain in left arm: Secondary | ICD-10-CM | POA: Diagnosis not present

## 2021-03-22 DIAGNOSIS — Z800 Family history of malignant neoplasm of digestive organs: Secondary | ICD-10-CM | POA: Diagnosis not present

## 2021-03-22 DIAGNOSIS — Z801 Family history of malignant neoplasm of trachea, bronchus and lung: Secondary | ICD-10-CM | POA: Diagnosis not present

## 2021-03-22 DIAGNOSIS — Z170 Estrogen receptor positive status [ER+]: Secondary | ICD-10-CM | POA: Diagnosis not present

## 2021-03-22 DIAGNOSIS — Z510 Encounter for antineoplastic radiation therapy: Secondary | ICD-10-CM | POA: Diagnosis not present

## 2021-03-22 DIAGNOSIS — C50312 Malignant neoplasm of lower-inner quadrant of left female breast: Secondary | ICD-10-CM | POA: Diagnosis not present

## 2021-03-23 ENCOUNTER — Ambulatory Visit
Admission: RE | Admit: 2021-03-23 | Discharge: 2021-03-23 | Disposition: A | Discharge status: Home / Self Care | Payer: BC Managed Care – PPO | Source: Ambulatory Visit | Attending: Radiation Oncology | Admitting: Radiation Oncology

## 2021-03-23 DIAGNOSIS — Z510 Encounter for antineoplastic radiation therapy: Secondary | ICD-10-CM | POA: Diagnosis not present

## 2021-03-23 DIAGNOSIS — C50312 Malignant neoplasm of lower-inner quadrant of left female breast: Secondary | ICD-10-CM | POA: Diagnosis not present

## 2021-03-23 DIAGNOSIS — Z801 Family history of malignant neoplasm of trachea, bronchus and lung: Secondary | ICD-10-CM | POA: Diagnosis not present

## 2021-03-23 DIAGNOSIS — M79602 Pain in left arm: Secondary | ICD-10-CM | POA: Diagnosis not present

## 2021-03-23 DIAGNOSIS — Z87891 Personal history of nicotine dependence: Secondary | ICD-10-CM | POA: Diagnosis not present

## 2021-03-23 DIAGNOSIS — Z800 Family history of malignant neoplasm of digestive organs: Secondary | ICD-10-CM | POA: Diagnosis not present

## 2021-03-23 DIAGNOSIS — Z170 Estrogen receptor positive status [ER+]: Secondary | ICD-10-CM | POA: Diagnosis not present

## 2021-03-24 ENCOUNTER — Encounter: Payer: Self-pay | Admitting: Oncology

## 2021-03-24 ENCOUNTER — Ambulatory Visit
Admission: RE | Admit: 2021-03-24 | Discharge: 2021-03-24 | Disposition: A | Discharge status: Home / Self Care | Payer: BC Managed Care – PPO | Source: Ambulatory Visit | Attending: Radiation Oncology | Admitting: Radiation Oncology

## 2021-03-24 DIAGNOSIS — Z800 Family history of malignant neoplasm of digestive organs: Secondary | ICD-10-CM | POA: Diagnosis not present

## 2021-03-24 DIAGNOSIS — Z87891 Personal history of nicotine dependence: Secondary | ICD-10-CM | POA: Diagnosis not present

## 2021-03-24 DIAGNOSIS — M79602 Pain in left arm: Secondary | ICD-10-CM | POA: Diagnosis not present

## 2021-03-24 DIAGNOSIS — C50312 Malignant neoplasm of lower-inner quadrant of left female breast: Secondary | ICD-10-CM | POA: Diagnosis not present

## 2021-03-24 DIAGNOSIS — Z801 Family history of malignant neoplasm of trachea, bronchus and lung: Secondary | ICD-10-CM | POA: Diagnosis not present

## 2021-03-24 DIAGNOSIS — Z510 Encounter for antineoplastic radiation therapy: Secondary | ICD-10-CM | POA: Diagnosis not present

## 2021-03-24 DIAGNOSIS — Z170 Estrogen receptor positive status [ER+]: Secondary | ICD-10-CM | POA: Diagnosis not present

## 2021-03-25 ENCOUNTER — Ambulatory Visit
Admission: RE | Admit: 2021-03-25 | Discharge: 2021-03-25 | Disposition: A | Discharge status: Home / Self Care | Payer: BC Managed Care – PPO | Source: Ambulatory Visit | Attending: Radiation Oncology | Admitting: Radiation Oncology

## 2021-03-25 ENCOUNTER — Ambulatory Visit (INDEPENDENT_AMBULATORY_CARE_PROVIDER_SITE_OTHER): Payer: BC Managed Care – PPO | Admitting: Family Medicine

## 2021-03-25 ENCOUNTER — Encounter: Payer: Self-pay | Admitting: Oncology

## 2021-03-25 ENCOUNTER — Encounter: Payer: Self-pay | Admitting: Family Medicine

## 2021-03-25 ENCOUNTER — Other Ambulatory Visit: Payer: Self-pay

## 2021-03-25 VITALS — BP 132/75 | HR 67 | Resp 16 | Ht 69.0 in | Wt 156.9 lb

## 2021-03-25 DIAGNOSIS — Z0000 Encounter for general adult medical examination without abnormal findings: Secondary | ICD-10-CM

## 2021-03-25 DIAGNOSIS — Z136 Encounter for screening for cardiovascular disorders: Secondary | ICD-10-CM

## 2021-03-25 DIAGNOSIS — F4329 Adjustment disorder with other symptoms: Secondary | ICD-10-CM | POA: Diagnosis not present

## 2021-03-25 DIAGNOSIS — Z801 Family history of malignant neoplasm of trachea, bronchus and lung: Secondary | ICD-10-CM | POA: Diagnosis not present

## 2021-03-25 DIAGNOSIS — C50912 Malignant neoplasm of unspecified site of left female breast: Secondary | ICD-10-CM

## 2021-03-25 DIAGNOSIS — Z1159 Encounter for screening for other viral diseases: Secondary | ICD-10-CM | POA: Diagnosis not present

## 2021-03-25 DIAGNOSIS — Z510 Encounter for antineoplastic radiation therapy: Secondary | ICD-10-CM | POA: Diagnosis not present

## 2021-03-25 DIAGNOSIS — R42 Dizziness and giddiness: Secondary | ICD-10-CM

## 2021-03-25 DIAGNOSIS — Z800 Family history of malignant neoplasm of digestive organs: Secondary | ICD-10-CM | POA: Diagnosis not present

## 2021-03-25 DIAGNOSIS — Z13220 Encounter for screening for lipoid disorders: Secondary | ICD-10-CM | POA: Diagnosis not present

## 2021-03-25 DIAGNOSIS — Z87891 Personal history of nicotine dependence: Secondary | ICD-10-CM | POA: Diagnosis not present

## 2021-03-25 DIAGNOSIS — C50312 Malignant neoplasm of lower-inner quadrant of left female breast: Secondary | ICD-10-CM | POA: Diagnosis not present

## 2021-03-25 DIAGNOSIS — Z170 Estrogen receptor positive status [ER+]: Secondary | ICD-10-CM

## 2021-03-25 DIAGNOSIS — M79602 Pain in left arm: Secondary | ICD-10-CM | POA: Diagnosis not present

## 2021-03-25 MED ORDER — LORAZEPAM 0.5 MG PO TABS: 0.5000 mg | ORAL_TABLET | Freq: Two times a day (BID) | ORAL | 1 refills | Status: DC | PRN

## 2021-03-25 NOTE — Assessment & Plan Note (Signed)
Currently ongoing treatment; refused exam today Reassurance provided

## 2021-03-25 NOTE — Assessment & Plan Note (Signed)
Request for PRN benzo refill Going through cancer treatment Notes that some waxing/waning moments Family/friends taking it well Hasn't decided if she will go into menopause with pills/injections- will decide in January

## 2021-03-25 NOTE — Assessment & Plan Note (Signed)
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

## 2021-03-25 NOTE — Assessment & Plan Note (Signed)
Low risk screening

## 2021-03-25 NOTE — Assessment & Plan Note (Signed)
UTD on dental Due for eye exam Things to do to keep yourself healthy  - Exercise at least 30-45 minutes a day, 3-4 days a week.  - Eat a low-fat diet with lots of fruits and vegetables, up to 7-9 servings per day.  - Seatbelts can save your life. Wear them always.  - Smoke detectors on every level of your home, check batteries every year.  - Eye Doctor - have an eye exam every 1-2 years  - Safe sex - if you may be exposed to STDs, use a condom.  - Alcohol -  If you drink, do it moderately, less than 2 drinks per day.  - [place]. Choose someone to speak for you if you are not able.  - Depression is common in our stressful world.If you're feeling down or losing interest in things you normally enjoy, please come in for a visit.  - Violence - If anyone is threatening or hurting you, please call immediately.

## 2021-03-25 NOTE — Assessment & Plan Note (Signed)
Screening lipid

## 2021-03-25 NOTE — Progress Notes (Signed)
Complete physical exam   Patient: Megan Cummings   DOB: 04-06-1975   45 y.o. Female  MRN: [id number] Visit Date: 03/25/2021  Today's healthcare provider: Gwyneth Sprout, FNP   Chief Complaint  Patient presents with   Annual Exam   Subjective     HPI  Megan Cummings is a 45 y.o. female who presents today for a complete physical exam.  She reports consuming a general diet. Gym/ health club routine includes cardio and light weights. She generally feels well. She reports sleeping poorly, patient reports that she has difficulty staying asleep in the past several weeks. . She does have additional problems to discuss today, patient reports that she has been experiencing dizziness for the past 3 weeks. Patient states that when she applies pressure to the back of her head she experiences sensation and states that she first noticed when she was working out at [employer].  Last reported Pap-11/04/20 BMD-03/02/21 Mammo-01/20/21  Past Medical History:  Diagnosis Date   Basal cell carcinoma 09/21/2015   Left anterior shoulder. Superficial and nodular patterns.   Basal cell carcinoma 09/28/2015   Left spinal upper back. Superficial.   Basal cell carcinoma 12/06/2016   Right forearm. Superficial.    Breast cancer ([place])    Left   Complication of anesthesia    woke up during colonoscopy   Dysplastic nevus 12/06/2016   Right spinal mid back. Severe atypia and halo nevus effect, margin involved. Excised: 02/13/2017. Margins free   Dysplastic nevus 07/04/2017   Right upper back. Moderate atypia, close to margin.   Family history of colon cancer    Family history of lung cancer    Fibrocystic breast    History of kidney stones    h/o   Stress incontinence    Umbilical hernia 63/0160   Past Surgical History:  Procedure Laterality Date   BREAST BIOPSY Left 01/13/2021   [place] bx/ ribbon clip/ path pending   COLONOSCOPY N/A 11/18/2020   Procedure: COLONOSCOPY;  Surgeon: Virgel Manifold, MD;  Location: [facility] ENDOSCOPY;  Service: Endoscopy;  Laterality: N/A;   FOOT SURGERY Right 07/04/2010   PART MASTECTOMY,RADIO FREQUENCY LOCALIZER,AXILLARY SENTINEL NODE BIOPSY Left 01/25/2021   Procedure: PART MASTECTOMY,RADIO FREQUENCY LOCALIZER,AXILLARY SENTINEL NODE BIOPSY;  Surgeon: Herbert Pun, MD;  Location: [facility] ORS;  Service: General;  Laterality: Left;   Social History   Socioeconomic History   Marital status: Married    Spouse name: Not on file   Number of children: Not on file   Years of education: Not on file   Highest education level: Not on file  Occupational History   Not on file  Tobacco Use   Smoking status: Former    Packs/day: 0.50    Years: 10.00    Pack years: 5.00    Types: Cigarettes    Quit date: 2005    Years since quitting: 17.9   Smokeless tobacco: Never  Vaping Use   Vaping Use: Never used  Substance and Sexual Activity   Alcohol use: Yes    Comment: Occasionally drinks wine   Drug use: No   Sexual activity: Yes    Birth control/protection: None  Other Topics Concern   Not on file  Social History Narrative   Not on file   Social Determinants of Health   Financial Resource Strain: Not on file  Food Insecurity: Not on file  Transportation Needs: Not on file  Physical Activity: Not on file  Stress: Not  on file  Social Connections: Not on file  Intimate Partner Violence: Not on file   Family Status  Relation Name Status   Mother  Alive   Father  Alive   Brother  Alive   Mat Uncle  Deceased   MGM  Deceased   MGF  Deceased       suicide   PGM  Deceased   PGF  Deceased   Son  Alive   Neg Hx  (Not Specified)   Family History  Problem Relation Age of Onset   Healthy Mother    Hyperlipidemia Father    Healthy Brother    Lung cancer Maternal Uncle    Healthy Maternal Grandmother    Emphysema Maternal Grandfather    Colon cancer Paternal Grandmother    Lung cancer Paternal Grandfather    Healthy Son    Breast  cancer Neg Hx    Allergies  Allergen Reactions   Sulfa Antibiotics Rash    Patient Care Team: Gwyneth Sprout, FNP as PCP - General (Family Medicine)   Medications: Outpatient Medications Prior to Visit  Medication Sig   [DISCONTINUED] lidocaine-prilocaine (EMLA) cream Apply topically once. (Patient not taking: Reported on 02/08/2021)   [DISCONTINUED] Na Sulfate-K Sulfate-Mg Sulf (SUPREP BOWEL PREP KIT) 17.5-3.13-1.6 GM/177ML SOLN Take 1 kit by mouth as directed. (Patient not taking: Reported on 02/08/2021)   No facility-administered medications prior to visit.    Review of Systems  Neurological:  Positive for dizziness.  Psychiatric/Behavioral:  The patient is nervous/anxious.      Objective    BP 132/75    Pulse 67    Resp 16    Ht _0  (1.753 m)    Wt 156 lb 14.4 oz (71.2 kg)    SpO2 100%    BMI 23.17 kg/m    Physical Exam Vitals and nursing note reviewed.  Constitutional:      General: She is awake. She is not in acute distress.    Appearance: Normal appearance. She is well-developed, well-groomed and normal weight. She is not ill-appearing, toxic-appearing or diaphoretic.  HENT:     Head: Normocephalic and atraumatic.     Jaw: There is normal jaw occlusion. No trismus, tenderness, swelling or pain on movement.     Right Ear: Hearing, tympanic membrane, ear canal and external ear normal. There is no impacted cerumen.     Left Ear: Hearing, tympanic membrane, ear canal and external ear normal. There is no impacted cerumen.     Nose: Nose normal. No congestion or rhinorrhea.     Right Turbinates: Not enlarged, swollen or pale.     Left Turbinates: Not enlarged, swollen or pale.     Right Sinus: No maxillary sinus tenderness or frontal sinus tenderness.     Left Sinus: No maxillary sinus tenderness or frontal sinus tenderness.     Mouth/Throat:     Lips: Pink.     Mouth: Mucous membranes are moist. No injury.     Tongue: No lesions.     Pharynx: Oropharynx is clear.  Uvula midline. No pharyngeal swelling, oropharyngeal exudate, posterior oropharyngeal erythema or uvula swelling.     Tonsils: No tonsillar exudate or tonsillar abscesses.  Eyes:     General: Lids are normal. Lids are everted, no foreign bodies appreciated. Vision grossly intact. Gaze aligned appropriately. No allergic shiner or visual field deficit.       Right eye: No discharge.        Left eye: No discharge.  Extraocular Movements: Extraocular movements intact.     Conjunctiva/sclera: Conjunctivae normal.     Right eye: Right conjunctiva is not injected. No exudate.    Left eye: Left conjunctiva is not injected. No exudate.    Pupils: Pupils are equal, round, and reactive to light.  Neck:     Thyroid: No thyroid mass, thyromegaly or thyroid tenderness.     Vascular: No carotid bruit.     Trachea: Trachea normal.  Cardiovascular:     Rate and Rhythm: Normal rate and regular rhythm.     Pulses: Normal pulses.          Carotid pulses are 2+ on the right side and 2+ on the left side.      Radial pulses are 2+ on the right side and 2+ on the left side.       Dorsalis pedis pulses are 2+ on the right side and 2+ on the left side.       Posterior tibial pulses are 2+ on the right side and 2+ on the left side.     Heart sounds: Normal heart sounds, S1 normal and S2 normal. No murmur heard.   No friction rub. No gallop.  Pulmonary:     Effort: Pulmonary effort is normal. No respiratory distress.     Breath sounds: Normal breath sounds and air entry. No stridor. No wheezing, rhonchi or rales.  Chest:     Chest wall: No tenderness.     Comments: Breast exam deferred; currently undergoing cancer treatment for Stage 1 breast cancer Abdominal:     General: Abdomen is flat. Bowel sounds are normal. There is no distension.     Palpations: Abdomen is soft. There is no mass.     Tenderness: There is no abdominal tenderness. There is no right CVA tenderness, left CVA tenderness, guarding or  rebound.     Hernia: No hernia is present.  Genitourinary:    Comments: Exam deferred; denies complaints Musculoskeletal:        General: No swelling, tenderness, deformity or signs of injury. Normal range of motion.     Cervical back: Full passive range of motion without pain, normal range of motion and neck supple. No edema, rigidity or tenderness. No muscular tenderness.     Right lower leg: No edema.     Left lower leg: No edema.  Lymphadenopathy:     Cervical: No cervical adenopathy.     Right cervical: No superficial, deep or posterior cervical adenopathy.    Left cervical: No superficial, deep or posterior cervical adenopathy.  Skin:    General: Skin is warm and dry.     Capillary Refill: Capillary refill takes less than 2 seconds.     Coloration: Skin is not jaundiced or pale.     Findings: No bruising, erythema, lesion or rash.  Neurological:     General: No focal deficit present.     Mental Status: She is alert and oriented to person, place, and time. Mental status is at baseline.     GCS: GCS eye subscore is 4. GCS verbal subscore is 5. GCS motor subscore is 6.     Sensory: Sensation is intact. No sensory deficit.     Motor: Motor function is intact. No weakness.     Coordination: Coordination is intact. Coordination normal.     Gait: Gait is intact. Gait normal.  Psychiatric:        Attention and Perception: Attention and perception normal.  Mood and Affect: Mood and affect normal.        Speech: Speech normal.        Behavior: Behavior normal. Behavior is cooperative.        Thought Content: Thought content normal.        Cognition and Memory: Cognition and memory normal.        Judgment: Judgment normal.      Last depression screening scores PHQ 2/9 Scores 03/25/2021 11/04/2020 06/19/2018  PHQ - 2 Score 0 0 0  PHQ- 9 Score 3 0 1   Last fall risk screening No flowsheet data found. Last Audit-C alcohol use screening Alcohol Use Disorder Test (AUDIT)  06/19/2018  1. How often do you have a drink containing alcohol? 0  2. How many drinks containing alcohol do you have on a typical day when you are drinking? 0  3. How often do you have six or more drinks on one occasion? 0  AUDIT-C Score 0   A score of 3 or more in women, and 4 or more in men indicates increased risk for alcohol abuse, EXCEPT if all of the points are from question 1   No results found for any visits on 03/25/21.  Assessment & Plan    Routine Health Maintenance and Physical Exam  Exercise Activities and Dietary recommendations  Goals   None     Immunization History  Administered Date(s) Administered   Tdap 10/28/2010    Health Maintenance  Topic Date Due   Pneumococcal Vaccine 30-62 Years old (1 - PCV) Never done   Hepatitis C Screening  Never done   TETANUS/TDAP  10/27/2020   COVID-19 Vaccine (1) 04/10/2021 (Originally 08/08/1976)   INFLUENZA VACCINE  07/02/2021 (Originally 11/02/2020)   MAMMOGRAM  01/07/2022   PAP SMEAR-Modifier  11/05/2023   COLONOSCOPY (Pts 45-74yr Insurance coverage will need to be confirmed)  11/19/2030   HIV Screening  Completed   HPV VACCINES  Aged Out    Discussed health benefits of physical activity, and encouraged her to engage in regular exercise appropriate for her age and condition.  Problem List Items Addressed This Visit       Other   Malignant neoplasm of breast, stage 1, estrogen receptor positive, left ([place]    Currently ongoing treatment; refused exam today Reassurance provided      Relevant Medications   LORazepam (ATIVAN) 0.5 MG tablet   Encounter for hepatitis C screening test for low risk patient    Low risk screening      Relevant Orders   Hepatitis C Antibody   Annual physical exam - Primary    UTD on dental Due for eye exam Things to do to keep yourself healthy  - Exercise at least 30-45 minutes a day, 3-4 days a week.  - Eat a low-fat diet with lots of fruits and vegetables, up to 7-9 servings per  day.  - Seatbelts can save your life. Wear them always.  - Smoke detectors on every level of your home, check batteries every year.  - Eye Doctor - have an eye exam every 1-2 years  - Safe sex - if you may be exposed to STDs, use a condom.  - Alcohol -  If you drink, do it moderately, less than 2 drinks per day.  - [place] Choose someone to speak for you if you are not able.  - Depression is common in our stressful world.If you're feeling down or losing interest in things  you normally enjoy, please come in for a visit.  - Violence - If anyone is threatening or hurting you, please call immediately.        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     Vonna Kotyk, FNP, have reviewed all documentation for this visit. The documentation on 03/25/21 for the exam, diagnosis, procedures, and orders are all accurate and complete.    Gwyneth Sprout, [place] [phone] (phone) [phone] (fax)  [place]

## 2021-03-26 ENCOUNTER — Ambulatory Visit
Admission: RE | Admit: 2021-03-26 | Discharge: 2021-03-26 | Disposition: A | Discharge status: Home / Self Care | Payer: BC Managed Care – PPO | Source: Ambulatory Visit | Attending: Radiation Oncology | Admitting: Radiation Oncology

## 2021-03-26 DIAGNOSIS — Z170 Estrogen receptor positive status [ER+]: Secondary | ICD-10-CM | POA: Diagnosis not present

## 2021-03-26 DIAGNOSIS — Z801 Family history of malignant neoplasm of trachea, bronchus and lung: Secondary | ICD-10-CM | POA: Diagnosis not present

## 2021-03-26 DIAGNOSIS — M79602 Pain in left arm: Secondary | ICD-10-CM | POA: Diagnosis not present

## 2021-03-26 DIAGNOSIS — C50312 Malignant neoplasm of lower-inner quadrant of left female breast: Secondary | ICD-10-CM | POA: Diagnosis not present

## 2021-03-26 DIAGNOSIS — Z87891 Personal history of nicotine dependence: Secondary | ICD-10-CM | POA: Diagnosis not present

## 2021-03-26 DIAGNOSIS — Z800 Family history of malignant neoplasm of digestive organs: Secondary | ICD-10-CM | POA: Diagnosis not present

## 2021-03-26 DIAGNOSIS — Z510 Encounter for antineoplastic radiation therapy: Secondary | ICD-10-CM | POA: Diagnosis not present

## 2021-03-30 ENCOUNTER — Ambulatory Visit
Admission: RE | Admit: 2021-03-30 | Discharge: 2021-03-30 | Disposition: A | Discharge status: Home / Self Care | Payer: BC Managed Care – PPO | Source: Ambulatory Visit | Attending: Radiation Oncology | Admitting: Radiation Oncology

## 2021-03-30 DIAGNOSIS — Z1159 Encounter for screening for other viral diseases: Secondary | ICD-10-CM | POA: Diagnosis not present

## 2021-03-30 DIAGNOSIS — Z510 Encounter for antineoplastic radiation therapy: Secondary | ICD-10-CM | POA: Diagnosis not present

## 2021-03-30 DIAGNOSIS — Z801 Family history of malignant neoplasm of trachea, bronchus and lung: Secondary | ICD-10-CM | POA: Diagnosis not present

## 2021-03-30 DIAGNOSIS — M79602 Pain in left arm: Secondary | ICD-10-CM | POA: Diagnosis not present

## 2021-03-30 DIAGNOSIS — Z800 Family history of malignant neoplasm of digestive organs: Secondary | ICD-10-CM | POA: Diagnosis not present

## 2021-03-30 DIAGNOSIS — C50312 Malignant neoplasm of lower-inner quadrant of left female breast: Secondary | ICD-10-CM | POA: Diagnosis not present

## 2021-03-30 DIAGNOSIS — Z87891 Personal history of nicotine dependence: Secondary | ICD-10-CM | POA: Diagnosis not present

## 2021-03-30 DIAGNOSIS — Z170 Estrogen receptor positive status [ER+]: Secondary | ICD-10-CM | POA: Diagnosis not present

## 2021-03-30 DIAGNOSIS — Z13220 Encounter for screening for lipoid disorders: Secondary | ICD-10-CM | POA: Diagnosis not present

## 2021-03-30 DIAGNOSIS — Z136 Encounter for screening for cardiovascular disorders: Secondary | ICD-10-CM | POA: Diagnosis not present

## 2021-03-31 ENCOUNTER — Other Ambulatory Visit: Payer: Self-pay

## 2021-03-31 ENCOUNTER — Ambulatory Visit
Admission: RE | Admit: 2021-03-31 | Discharge: 2021-03-31 | Disposition: A | Discharge status: Home / Self Care | Payer: BC Managed Care – PPO | Source: Ambulatory Visit | Attending: Radiation Oncology | Admitting: Radiation Oncology

## 2021-03-31 ENCOUNTER — Other Ambulatory Visit: Payer: Self-pay | Admitting: Oncology

## 2021-03-31 ENCOUNTER — Inpatient Hospital Stay: Payer: BC Managed Care – PPO

## 2021-03-31 DIAGNOSIS — Z170 Estrogen receptor positive status [ER+]: Secondary | ICD-10-CM | POA: Diagnosis not present

## 2021-03-31 DIAGNOSIS — M79602 Pain in left arm: Secondary | ICD-10-CM | POA: Diagnosis not present

## 2021-03-31 DIAGNOSIS — Z801 Family history of malignant neoplasm of trachea, bronchus and lung: Secondary | ICD-10-CM | POA: Diagnosis not present

## 2021-03-31 DIAGNOSIS — Z87891 Personal history of nicotine dependence: Secondary | ICD-10-CM | POA: Diagnosis not present

## 2021-03-31 DIAGNOSIS — Z510 Encounter for antineoplastic radiation therapy: Secondary | ICD-10-CM | POA: Diagnosis not present

## 2021-03-31 DIAGNOSIS — Z800 Family history of malignant neoplasm of digestive organs: Secondary | ICD-10-CM | POA: Diagnosis not present

## 2021-03-31 DIAGNOSIS — C50312 Malignant neoplasm of lower-inner quadrant of left female breast: Secondary | ICD-10-CM | POA: Diagnosis not present

## 2021-03-31 LAB — CBC
HCT: 38.2 % (ref 36.0–46.0)
Hemoglobin: 12.5 g/dL (ref 12.0–15.0)
MCH: 30.6 pg (ref 26.0–34.0)
MCHC: 32.7 g/dL (ref 30.0–36.0)
MCV: 93.4 fL (ref 80.0–100.0)
Platelets: 366 10*3/uL (ref 150–400)
RBC: 4.09 MIL/uL (ref 3.87–5.11)
RDW: 12.4 % (ref 11.5–15.5)
WBC: 7.9 10*3/uL (ref 4.0–10.5)
nRBC: 0 % (ref 0.0–0.2)

## 2021-03-31 LAB — LIPID PANEL
Chol/HDL Ratio: 3 ratio (ref 0.0–4.4)
Cholesterol, Total: 200 mg/dL — ABNORMAL HIGH (ref 100–199)
HDL: 66 mg/dL (ref >39)
LDL Chol Calc (NIH): 119 mg/dL — ABNORMAL HIGH (ref 0–99)
Triglycerides: 81 mg/dL (ref 0–149)
VLDL Cholesterol Cal: 15 mg/dL (ref 5–40)

## 2021-03-31 LAB — HEPATITIS C ANTIBODY: Hep C Virus Ab: <0.1 s/co ratio (ref 0.0–0.9)

## 2021-04-01 ENCOUNTER — Ambulatory Visit
Admission: RE | Admit: 2021-04-01 | Discharge: 2021-04-01 | Disposition: A | Discharge status: Home / Self Care | Payer: BC Managed Care – PPO | Source: Ambulatory Visit | Attending: Radiation Oncology | Admitting: Radiation Oncology

## 2021-04-01 DIAGNOSIS — Z800 Family history of malignant neoplasm of digestive organs: Secondary | ICD-10-CM | POA: Diagnosis not present

## 2021-04-01 DIAGNOSIS — Z170 Estrogen receptor positive status [ER+]: Secondary | ICD-10-CM | POA: Diagnosis not present

## 2021-04-01 DIAGNOSIS — C50312 Malignant neoplasm of lower-inner quadrant of left female breast: Secondary | ICD-10-CM | POA: Diagnosis not present

## 2021-04-01 DIAGNOSIS — Z510 Encounter for antineoplastic radiation therapy: Secondary | ICD-10-CM | POA: Diagnosis not present

## 2021-04-01 DIAGNOSIS — Z801 Family history of malignant neoplasm of trachea, bronchus and lung: Secondary | ICD-10-CM | POA: Diagnosis not present

## 2021-04-01 DIAGNOSIS — M79602 Pain in left arm: Secondary | ICD-10-CM | POA: Diagnosis not present

## 2021-04-01 DIAGNOSIS — Z87891 Personal history of nicotine dependence: Secondary | ICD-10-CM | POA: Diagnosis not present

## 2021-04-02 ENCOUNTER — Ambulatory Visit
Admission: RE | Admit: 2021-04-02 | Discharge: 2021-04-02 | Disposition: A | Discharge status: Home / Self Care | Payer: BC Managed Care – PPO | Source: Ambulatory Visit | Attending: Radiation Oncology | Admitting: Radiation Oncology

## 2021-04-02 DIAGNOSIS — Z800 Family history of malignant neoplasm of digestive organs: Secondary | ICD-10-CM | POA: Diagnosis not present

## 2021-04-02 DIAGNOSIS — Z170 Estrogen receptor positive status [ER+]: Secondary | ICD-10-CM | POA: Diagnosis not present

## 2021-04-02 DIAGNOSIS — Z801 Family history of malignant neoplasm of trachea, bronchus and lung: Secondary | ICD-10-CM | POA: Diagnosis not present

## 2021-04-02 DIAGNOSIS — Z510 Encounter for antineoplastic radiation therapy: Secondary | ICD-10-CM | POA: Diagnosis not present

## 2021-04-02 DIAGNOSIS — M79602 Pain in left arm: Secondary | ICD-10-CM | POA: Diagnosis not present

## 2021-04-02 DIAGNOSIS — C50312 Malignant neoplasm of lower-inner quadrant of left female breast: Secondary | ICD-10-CM | POA: Diagnosis not present

## 2021-04-02 DIAGNOSIS — Z87891 Personal history of nicotine dependence: Secondary | ICD-10-CM | POA: Diagnosis not present

## 2021-04-06 ENCOUNTER — Ambulatory Visit
Admission: RE | Admit: 2021-04-06 | Discharge: 2021-04-06 | Disposition: A | Discharge status: Home / Self Care | Payer: BC Managed Care – PPO | Source: Ambulatory Visit | Attending: Radiation Oncology | Admitting: Radiation Oncology

## 2021-04-06 ENCOUNTER — Inpatient Hospital Stay: Payer: BC Managed Care – PPO | Attending: Oncology

## 2021-04-06 DIAGNOSIS — Z510 Encounter for antineoplastic radiation therapy: Secondary | ICD-10-CM | POA: Insufficient documentation

## 2021-04-06 DIAGNOSIS — C50912 Malignant neoplasm of unspecified site of left female breast: Secondary | ICD-10-CM | POA: Insufficient documentation

## 2021-04-06 DIAGNOSIS — Z170 Estrogen receptor positive status [ER+]: Secondary | ICD-10-CM | POA: Diagnosis not present

## 2021-04-06 DIAGNOSIS — C50312 Malignant neoplasm of lower-inner quadrant of left female breast: Secondary | ICD-10-CM | POA: Diagnosis not present

## 2021-04-12 ENCOUNTER — Encounter: Payer: Self-pay | Admitting: Family Medicine

## 2021-04-14 ENCOUNTER — Inpatient Hospital Stay: Payer: BC Managed Care – PPO | Attending: Oncology | Admitting: Occupational Therapy

## 2021-04-14 ENCOUNTER — Other Ambulatory Visit: Payer: Self-pay

## 2021-04-14 DIAGNOSIS — M79602 Pain in left arm: Secondary | ICD-10-CM | POA: Diagnosis not present

## 2021-04-14 NOTE — Therapy (Signed)
[facility] [place], [place], [place], [address] Phone: [phone]   Fax:  [phone]  Occupational Therapy Treatment  Patient Details  Name: Megan Cummings MRN: [id number] Date of Birth: 02-22-1976 Referring Provider (OT): Dr Windell Moment   Encounter Date: 04/14/2021   OT End of Session - 04/14/21 1315     Visit Number 6    Number of Visits 10    Date for OT Re-Evaluation 06/09/21    OT Start Time 1000    OT Stop Time 1028    OT Time Calculation (min) 28 min    Activity Tolerance Patient tolerated treatment well    Behavior During Therapy [facility] for tasks assessed/performed             Past Medical History:  Diagnosis Date   Basal cell carcinoma 09/21/2015   Left anterior shoulder. Superficial and nodular patterns.   Basal cell carcinoma 09/28/2015   Left spinal upper back. Superficial.   Basal cell carcinoma 12/06/2016   Right forearm. Superficial.    Breast cancer ([place])    Left   Complication of anesthesia    woke up during colonoscopy   Dysplastic nevus 12/06/2016   Right spinal mid back. Severe atypia and halo nevus effect, margin involved. Excised: 02/13/2017. Margins free   Dysplastic nevus 07/04/2017   Right upper back. Moderate atypia, close to margin.   Family history of colon cancer    Family history of lung cancer    Fibrocystic breast    History of kidney stones    h/o   Stress incontinence    Umbilical hernia 17/5102    Past Surgical History:  Procedure Laterality Date   BREAST BIOPSY Left 01/13/2021   [place] bx/ ribbon clip/ path pending   COLONOSCOPY N/A 11/18/2020   Procedure: COLONOSCOPY;  Surgeon: Virgel Manifold, MD;  Location: [facility] ENDOSCOPY;  Service: Endoscopy;  Laterality: N/A;   FOOT SURGERY Right 07/04/2010   PART MASTECTOMY,RADIO FREQUENCY LOCALIZER,AXILLARY SENTINEL NODE BIOPSY Left 01/25/2021   Procedure: PART MASTECTOMY,RADIO FREQUENCY LOCALIZER,AXILLARY  SENTINEL NODE BIOPSY;  Surgeon: Herbert Pun, MD;  Location: [facility] ORS;  Service: General;  Laterality: Left;    There were no vitals filed for this visit.   Subjective Assessment - 04/14/21 1311     Subjective  Finished my radiation -doing more exercises at cross fit - did try pull up the other day -but not ready - area in my armpit that you maybe need to release- but other wise motion coming along - not really pain and am starting some yoga in Febr    Patient Stated Goals I want my motion better and pain so I can do cross fit    Currently in Pain? No/denies                 LYMPHEDEMA/ONCOLOGY QUESTIONNAIRE - 04/14/21 0001       Right Upper Extremity Lymphedema   15 cm Proximal to Olecranon Process 28.8 cm    10 cm Proximal to Olecranon Process 28 cm    Olecranon Process 24.5 cm      Left Upper Extremity Lymphedema   15 cm Proximal to Olecranon Process 30 cm    10 cm Proximal to Olecranon Process 28.5 cm    Olecranon Process 24.4 cm             Great progress in AROM for L UE - WNL for shoulder flexion , ABD and ext rotation  Pt  report still pull over L pect and axilla - ask OT to assess if cording and release  Upon assessment - not cording - mostly some tightness still from surgery and radiation Recommend pt to do some yoga for flexibility - pt report she is schedule to start in [facility] is doing some more Cross fit -but some of the exercises not able to do -like pull ups Explain for pt to work gradually into strengthening Pt do report some swelling post work out under arm pit- pt to monitor and let me know if  cont to be issue - would recommend jovipak breast pad to wear to decrease chest wall and thoracic from lymphedema and decrease risk of it moving into L UE Pt L UE upper arm cont to be increase - pt is R hand dominant  Cont to monitor -if pt need over the counter preventive sleeve for high risk activities - pt to check arm post work out and let me  know Pt to follow up 2-4 wks with me                OT Education - 04/14/21 1315     Education Details progress in ROM, HEP and lymphedema signs and symptoms    Person(s) Educated Patient    Methods Explanation;Demonstration;Tactile cues;Verbal cues    Comprehension Verbal cues required;Returned demonstration;Verbalized understanding                 OT Long Term Goals - 04/14/21 1324       OT LONG TERM GOAL #1   Title Pain in L arm decrease to less than 3/10 to do pull over shirt and do hair    Baseline pain 8/10 in L arm at 80 degrees  - slight pull 1-2/10 and AROM WNL    Status Achieved      OT LONG TERM GOAL #2   Title Cording in L UE decrease for pt to show WNL L UE AROM to return to cross fit or ADL's/IADL"S    Baseline Cording in axilla - pain 8/10 to 80 degrees to ABD and Flexion 108 degrees L UE- NOW AROM WNL slight pull but less than 1-2/10 - cannot do all crossfit exercises    Status Achieved      OT LONG TERM GOAL #3   Title Pt to be independent in HEP to increase L UE AROM without increase symptoms    Status Achieved      OT LONG TERM GOAL #4   Title Monitor need for compression for L upper quadrant to decrease lymphedema or circumference in L UE    Baseline somw sweling post work out on chest wall and upper arm increase by 1.5 cm at upper arm    Time 8    Period Weeks    Status New    Target Date 06/09/21                   Plan - 04/14/21 1316     Clinical Impression Statement Pt present at OT eval  with diagnosis L partial mastectomy 01/25/21 - Pt's cording in L UE and pain decrease greatly since [facility]- pain was 8/10 and  80 degrees of L shoulder ABD and Flexion 108. Pt had last radiation 04/06/21 - and arrive with AROM WNL for shoulder flexion, ext and ext rotation -but do feel pull in axilla on the L. Pt request OT check of cordking and to release - but upon assessment did  not see any cording - tightness in axilla and pect - Recommend  for pt to do some yoga for flexibility and gradually increase cross fit weight , reps and sets.. Circumference in L UE cont to be increased, pt report after her workout she do have some sweling under the L arm pt-  will cont to monitor if she needs jovipak breast pad and preventitive sleeve during high risk activities. Pt to follow up with me in the next 2-4 wks.Pt ed on monitor and assess tolerance of L quadrant doing her workouts.. Pt cont to be limited by increase stiffness and discomfort in L upper quadrant and  shoulder during ADL's and IADl's.    OT Occupational Profile and History Problem Focused Assessment - Including review of records relating to presenting problem    Occupational performance deficits (Please refer to evaluation for details): IADL's;Play;Leisure;ADL's    Body Structure / Function / Physical Skills ADL;Strength;Pain;Edema;IADL;ROM;Scar mobility;Flexibility    Rehab Potential Good    Clinical Decision Making Limited treatment options, no task modification necessary    Comorbidities Affecting Occupational Performance: None    Modification or Assistance to Complete Evaluation  No modification of tasks or assist necessary to complete eval    OT Frequency Biweekly    OT Duration 8 weeks    OT Treatment/Interventions Self-care/ADL training;Manual lymph drainage;Compression bandaging;Therapeutic exercise;Manual Therapy;Patient/family education;Passive range of motion;Scar mobilization    Consulted and Agree with Plan of Care Patient             Patient will benefit from skilled therapeutic intervention in order to improve the following deficits and impairments:   Body Structure / Function / Physical Skills: ADL, Strength, Pain, Edema, IADL, ROM, Scar mobility, Flexibility       Visit Diagnosis: Pain in left arm    Problem List Patient Active Problem List   Diagnosis Date Noted   Encounter for hepatitis C screening test for low risk patient 03/25/2021   Annual  physical exam 03/25/2021   Encounter for lipid screening for cardiovascular disease 03/25/2021   Stress and adjustment reaction 03/25/2021   Dizziness, nonspecific 03/25/2021   Malignant neoplasm of breast, stage 1, estrogen receptor positive, left ([place]) 01/22/2021    Rosalyn Gess, OTR/L,CLT 04/14/2021, 1:27 PM  [facility] at [facility] [address] [place], [place], [address] Phone: [phone]   Fax:  [phone]  Name: Megan Cummings MRN: [id number] Date of Birth: 01-12-1976

## 2021-05-04 ENCOUNTER — Encounter: Payer: Self-pay | Admitting: Oncology

## 2021-05-04 ENCOUNTER — Inpatient Hospital Stay: Payer: BC Managed Care – PPO | Attending: Oncology

## 2021-05-04 ENCOUNTER — Inpatient Hospital Stay (HOSPITAL_BASED_OUTPATIENT_CLINIC_OR_DEPARTMENT_OTHER): Payer: BC Managed Care – PPO | Admitting: Oncology

## 2021-05-04 ENCOUNTER — Other Ambulatory Visit: Payer: Self-pay

## 2021-05-04 VITALS — BP 154/88 | HR 67 | Temp 98.2°F | Resp 16 | Ht 69.0 in | Wt 160.6 lb

## 2021-05-04 DIAGNOSIS — C50911 Malignant neoplasm of unspecified site of right female breast: Secondary | ICD-10-CM | POA: Insufficient documentation

## 2021-05-04 DIAGNOSIS — Z87891 Personal history of nicotine dependence: Secondary | ICD-10-CM | POA: Insufficient documentation

## 2021-05-04 DIAGNOSIS — Z800 Family history of malignant neoplasm of digestive organs: Secondary | ICD-10-CM | POA: Insufficient documentation

## 2021-05-04 DIAGNOSIS — C50912 Malignant neoplasm of unspecified site of left female breast: Secondary | ICD-10-CM | POA: Diagnosis not present

## 2021-05-04 DIAGNOSIS — Z170 Estrogen receptor positive status [ER+]: Secondary | ICD-10-CM

## 2021-05-04 DIAGNOSIS — Z801 Family history of malignant neoplasm of trachea, bronchus and lung: Secondary | ICD-10-CM | POA: Insufficient documentation

## 2021-05-04 DIAGNOSIS — E2839 Other primary ovarian failure: Secondary | ICD-10-CM | POA: Diagnosis not present

## 2021-05-04 MED ORDER — GOSERELIN ACETATE 3.6 MG ~~LOC~~ IMPL
3.6000 mg | DRUG_IMPLANT | SUBCUTANEOUS | Status: DC
  Filled 2021-05-04: qty 3.6

## 2021-05-04 MED ORDER — ANASTROZOLE 1 MG PO TABS: 1.0000 mg | ORAL_TABLET | Freq: Every day | ORAL | 3 refills | Status: DC

## 2021-05-04 MED ORDER — GOSERELIN ACETATE 3.6 MG ~~LOC~~ IMPL
3.6000 mg | DRUG_IMPLANT | Freq: Once | SUBCUTANEOUS | Status: AC
  Administered 2021-05-04: 3.6 mg via SUBCUTANEOUS
  Filled 2021-05-04: qty 3.6

## 2021-05-04 NOTE — Progress Notes (Signed)
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  Mammogram showed innumerable cysts in the left breast the largest of which was 1.2 x 1.1 x 1.4 cm.  Targeted ultrasound of the inner left breast showed a hypoechoic mass measuring 1 x 0.8 x 1.9 cm.  No definite lymphadenopathy noted in the left axilla.  No suspicious findings in the right breast.  She has had her last mammogram in 2019 when was again noted to have bilateral breast cysts and subsequent ultrasounds showed benign cysts with no suspicious lesions.Ultrasound-guided biopsy of the left breast mass showed invasive mammary carcinoma 6 mm grade 1 ER 81 to 90% positive PR 91 200% positive and HER2 equivocal by IHC and FISH testing negative   Menarche at the age of 46.  She is G1, P1 L1.  No use of birth control.  No prior hysterectomy.  She is still premenopausal and gets her menstrual cycles regularly.  Family history significant for colon cancer in paternal grandmother and lung cancer or lymphoma in maternal grandfather.   Final lumpectomy pathology showed 13 mm grade 1 invasive mammary carcinoma with negative margins.  3 sentinel lymph nodes negative for malignancy.   No lymphovascular invasion.  Oncotype score came back at 16And chemotherapy benefit for this score in patients less than 28 years of age would be ~1.6%.  Adjuvant chemotherapy was not recommended.  Patient completed adjuvant radiation treatment.  Plan is for ovarian suppression plus AI.  Interval history-patient is doing well presently and denies any specific complaints at this time  ECOG PS- 0 Pain scale- 0   Review of systems- Review of Systems  Constitutional:  Negative for chills, fever, malaise/fatigue and weight loss.  HENT:  Negative for congestion, ear discharge and nosebleeds.   Eyes:  Negative for blurred vision.  Respiratory:  Negative for cough, hemoptysis, sputum production, shortness of breath and wheezing.   Cardiovascular:  Negative for chest pain, palpitations, orthopnea and claudication.  Gastrointestinal:  Negative for abdominal pain, blood in stool, constipation, diarrhea, heartburn, melena, nausea and vomiting.  Genitourinary:  Negative for dysuria, flank pain, frequency, hematuria and urgency.  Musculoskeletal:  Negative for back pain, joint pain and myalgias.  Skin:  Negative for rash.  Neurological:  Negative for dizziness, tingling, focal weakness, seizures, weakness and headaches.  Endo/Heme/Allergies:  Does not bruise/bleed easily.  Psychiatric/Behavioral:  Negative for depression and suicidal ideas. The patient does not have insomnia.      Allergies  Allergen Reactions   Sulfa Antibiotics Rash     Past Medical History:  Diagnosis Date   Basal cell carcinoma 09/21/2015   Left anterior shoulder. Superficial and nodular patterns.   Basal cell carcinoma 09/28/2015   Left spinal upper back. Superficial.   Basal cell carcinoma 12/06/2016   Right forearm. Superficial.    Breast cancer ([place])  Left   Complication of anesthesia    woke up during colonoscopy   Dysplastic nevus 12/06/2016   Right spinal mid back. Severe atypia and halo nevus effect, margin  involved. Excised: 02/13/2017. Margins free   Dysplastic nevus 07/04/2017   Right upper back. Moderate atypia, close to margin.   Family history of colon cancer    Family history of lung cancer    Fibrocystic breast    History of kidney stones    h/o   Stress incontinence    Umbilical hernia 56/4332     Past Surgical History:  Procedure Laterality Date   BREAST BIOPSY Left 01/13/2021   [place] bx/ ribbon clip/ path pending   COLONOSCOPY N/A 11/18/2020   Procedure: COLONOSCOPY;  Surgeon: Virgel Manifold, MD;  Location: [facility] ENDOSCOPY;  Service: Endoscopy;  Laterality: N/A;   FOOT SURGERY Right 07/04/2010   PART MASTECTOMY,RADIO FREQUENCY LOCALIZER,AXILLARY SENTINEL NODE BIOPSY Left 01/25/2021   Procedure: PART MASTECTOMY,RADIO FREQUENCY LOCALIZER,AXILLARY SENTINEL NODE BIOPSY;  Surgeon: Herbert Pun, MD;  Location: [facility] ORS;  Service: General;  Laterality: Left;    Social History   Socioeconomic History   Marital status: Married    Spouse name: Not on file   Number of children: Not on file   Years of education: Not on file   Highest education level: Not on file  Occupational History   Not on file  Tobacco Use   Smoking status: Former    Packs/day: 0.50    Years: 10.00    Pack years: 5.00    Types: Cigarettes    Quit date: 2005    Years since quitting: 18.0   Smokeless tobacco: Never  Vaping Use   Vaping Use: Never used  Substance and Sexual Activity   Alcohol use: Yes    Comment: Occasionally drinks wine   Drug use: No   Sexual activity: Yes    Birth control/protection: None  Other Topics Concern   Not on file  Social History Narrative   Not on file   Social Determinants of Health   Financial Resource Strain: Not on file  Food Insecurity: Not on file  Transportation Needs: Not on file  Physical Activity: Not on file  Stress: Not on file  Social Connections: Not on file  Intimate Partner Violence: Not on file    Family History  Problem  Relation Age of Onset   Healthy Mother    Hyperlipidemia Father    Healthy Brother    Lung cancer Maternal Uncle    Healthy Maternal Grandmother    Emphysema Maternal Grandfather    Colon cancer Paternal Grandmother    Lung cancer Paternal Grandfather    Healthy Son    Breast cancer Neg Hx      Current Outpatient Medications:    anastrozole (ARIMIDEX) 1 MG tablet, Take 1 tablet (1 mg total) by mouth daily. Pt to start after having 3 doses of goserelin inj. Pt's knows when to start, Disp: 30 tablet, Rfl: 3   Calcium Carb-Cholecalciferol (CALCIUM 600 + D PO), Take 1 Dose by mouth daily. Vitamin d is 248mg, Disp: , Rfl:    KRILL OIL PO, Take 1,000 mg by mouth daily., Disp: , Rfl:    LORazepam (ATIVAN) 0.5 MG tablet, Take 1 tablet (0.5 mg total) by mouth 2 (two) times daily as needed for anxiety., Disp: 30 tablet, Rfl: 1  Physical exam:  Vitals:   05/04/21 1323  BP: (!) 154/88  Pulse: 67  Resp: 16  Temp: 98.2  F (36.8 C)  TempSrc: Oral  Weight: 160 lb 9.6 oz (72.8 kg)  Height: _0  (1.753 m)   Physical Exam Constitutional:      General: She is not in acute distress. Cardiovascular:     Rate and Rhythm: Normal rate and regular rhythm.     Heart sounds: Normal heart sounds.  Pulmonary:     Effort: Pulmonary effort is normal.  Skin:    General: Skin is warm and dry.  Neurological:     Mental Status: She is alert and oriented to person, place, and time.     CMP Latest Ref Rng & Units 01/19/2021  Glucose 70 - 99 mg/dL 71  BUN 6 - 20 mg/dL 17  Creatinine 0.44 - 1.00 mg/dL 0.97  Sodium 135 - 145 mmol/L 137  Potassium 3.5 - 5.1 mmol/L 4.0  Chloride 98 - 111 mmol/L 101  CO2 22 - 32 mmol/L 28  Calcium 8.9 - 10.3 mg/dL 9.4  Total Protein 6.5 - 8.1 g/dL 7.6  Total Bilirubin 0.3 - 1.2 mg/dL 1.0  Alkaline Phos 38 - 126 U/L 45  AST 15 - 41 U/L 14(L)  ALT 0 - 44 U/L 10   CBC Latest Ref Rng & Units 03/31/2021  WBC 4.0 - 10.5 K/uL 7.9  Hemoglobin 12.0 - 15.0 g/dL 12.5   Hematocrit 36.0 - 46.0 % 38.2  Platelets 150 - 400 K/uL 366    Assessment and plan- Patient is a 46 y.o. female with pathological prognostic stage Ia invasive mammary carcinoma of the right breast ER/PR positive HER2 negative.  She is now for routine follow-up of breast cancer and ovarian suppression with Zoladex  Patient was supposed to receive her first dose of Zoladex in early January 2023 but did not receive it.  Today will therefore be her first dose.  I plan to do 3 monthly doses of Zoladex before she can start aromatase inhibitor.  I will check [place] LH and estradiol levels 2 months from now.  Explained to the patient that she will likely get her next menstrual cycle despite first dose of Zoladex today but will likely not have return of menstrual cycles subsequently.  After we have confirmed that her blood levels are postmenopausal she will start Arimidex.  Discussed risks and benefits of both Zoladex and Arimidex including all but not limited to fatigue, hot flashes, mood swings, headaches, arthralgias and worsening bone health.  Baseline bone density scan was normal.  I will see her 3 months from now after she has started taking Arimidex and see how she is tolerating it.  Prescription for Arimidex was sent to her pharmacy but she will only start taking it 2 months from now after we will call her with the results of her hormone levels.  Patient will call us in the interim if she has any questions or concerns   Visit Diagnosis 1. Malignant neoplasm of breast, stage 1, estrogen receptor positive, left ([place])   2. Suppression of ovarian secretion      Dr. Randa Evens, MD, MPH [facility] [phone] 05/04/2021 3:49 PM

## 2021-05-04 NOTE — Progress Notes (Signed)
Survivorship Care Plan visit completed.  Treatment summary reviewed and given to patient.  ASCO answers booklet reviewed and given to patient.  CARE program and Cancer Transitions discussed with patient along with other resources cancer center offers to patients and caregivers.  Patient verbalized understanding.    

## 2021-05-04 NOTE — Progress Notes (Signed)
Pt doing ok with the breast cancer, no pain, no concerns

## 2021-05-12 ENCOUNTER — Other Ambulatory Visit: Payer: Self-pay

## 2021-05-12 ENCOUNTER — Encounter: Payer: Self-pay | Admitting: Radiation Oncology

## 2021-05-12 ENCOUNTER — Ambulatory Visit
Admission: RE | Admit: 2021-05-12 | Discharge: 2021-05-12 | Disposition: A | Discharge status: Home / Self Care | Payer: BC Managed Care – PPO | Source: Ambulatory Visit | Attending: Radiation Oncology | Admitting: Radiation Oncology

## 2021-05-12 DIAGNOSIS — Z79811 Long term (current) use of aromatase inhibitors: Secondary | ICD-10-CM | POA: Diagnosis not present

## 2021-05-12 DIAGNOSIS — Z923 Personal history of irradiation: Secondary | ICD-10-CM | POA: Insufficient documentation

## 2021-05-12 DIAGNOSIS — C50312 Malignant neoplasm of lower-inner quadrant of left female breast: Secondary | ICD-10-CM | POA: Diagnosis not present

## 2021-05-12 DIAGNOSIS — C50912 Malignant neoplasm of unspecified site of left female breast: Secondary | ICD-10-CM

## 2021-05-12 DIAGNOSIS — Z170 Estrogen receptor positive status [ER+]: Secondary | ICD-10-CM | POA: Diagnosis not present

## 2021-05-12 NOTE — Progress Notes (Signed)
Radiation Oncology Follow up Note  Name: Megan Cummings   Date:   05/12/2021 MRN:  [id number] DOB: December 02, 1975    This 46 y.o. female presents to the clinic today for 1 month follow-up status post whole breast radiation to her left breast for stage Ia (T1 cN0 M0) ER/PR positive HER2 negative invasive mammary carcinoma.  REFERRING PROVIDER: Gwyneth Sprout, FNP  HPI: Patient is a 46 year old female now out 1 month having completed whole breast radiation to her left breast for stage I ER/PR positive invasive mammary carcinoma seen today in routine follow-up she is doing well.  She specifically denies breast tenderness cough or bone pain..  She has been started on Arimidex tolerating that well without side effect.  COMPLICATIONS OF TREATMENT: none  FOLLOW UP COMPLIANCE: keeps appointments   PHYSICAL EXAM:  BP (P) 121/73 (BP Location: Right Arm, Patient Position: Sitting)    Pulse (P) 65    Temp (P) 98.1 F (36.7 C) (Tympanic)    Resp (P) 16    Wt (P) 161 lb 1.6 oz (73.1 kg)    BMI (P) 23.79 kg/m  Lungs are clear to A&P cardiac examination essentially unremarkable with regular rate and rhythm. No dominant mass or nodularity is noted in either breast in 2 positions examined. Incision is well-healed. No axillary or supraclavicular adenopathy is appreciated. Cosmetic result is excellent.  Well-developed well-nourished patient in NAD. HEENT reveals PERLA, EOMI, discs not visualized.  Oral cavity is clear. No oral mucosal lesions are identified. Neck is clear without evidence of cervical or supraclavicular adenopathy. Lungs are clear to A&P. Cardiac examination is essentially unremarkable with regular rate and rhythm without murmur rub or thrill. Abdomen is benign with no organomegaly or masses noted. Motor sensory and DTR levels are equal and symmetric in the upper and lower extremities. Cranial nerves II through XII are grossly intact. Proprioception is intact. No peripheral adenopathy or edema is  identified. No motor or sensory levels are noted. Crude visual fields are within normal range.  RADIOLOGY RESULTS: No current films for review  PLAN: Present time patient is doing extremely well 1 month out from whole breast radiation.  I am pleased with her overall progress.  I have asked to see her back in 5 months for follow-up.  She continues on Arimidex without side effect.  Patient is to call with any concerns.  I would like to take this opportunity to thank you for allowing me to participate in the care of your patient.Noreene Filbert, MD

## 2021-06-01 ENCOUNTER — Other Ambulatory Visit: Payer: Self-pay

## 2021-06-01 ENCOUNTER — Inpatient Hospital Stay: Payer: BC Managed Care – PPO | Attending: Oncology

## 2021-06-01 DIAGNOSIS — Z5111 Encounter for antineoplastic chemotherapy: Secondary | ICD-10-CM | POA: Insufficient documentation

## 2021-06-01 DIAGNOSIS — C50911 Malignant neoplasm of unspecified site of right female breast: Secondary | ICD-10-CM | POA: Diagnosis not present

## 2021-06-01 DIAGNOSIS — Z170 Estrogen receptor positive status [ER+]: Secondary | ICD-10-CM | POA: Insufficient documentation

## 2021-06-01 DIAGNOSIS — C50912 Malignant neoplasm of unspecified site of left female breast: Secondary | ICD-10-CM

## 2021-06-01 MED ORDER — GOSERELIN ACETATE 3.6 MG ~~LOC~~ IMPL
3.6000 mg | DRUG_IMPLANT | SUBCUTANEOUS | Status: DC
  Administered 2021-06-01: 3.6 mg via SUBCUTANEOUS
  Filled 2021-06-01: qty 3.6

## 2021-06-02 ENCOUNTER — Encounter: Payer: Self-pay | Admitting: Oncology

## 2021-06-21 DIAGNOSIS — N61 Mastitis without abscess: Secondary | ICD-10-CM | POA: Diagnosis not present

## 2021-06-25 ENCOUNTER — Telehealth: Payer: Self-pay | Admitting: *Deleted

## 2021-06-25 DIAGNOSIS — N61 Mastitis without abscess: Secondary | ICD-10-CM | POA: Diagnosis not present

## 2021-06-25 NOTE — Telephone Encounter (Signed)
Patient called cancer center and requesting a return apt with [facility]. Pt last evaluated by Gwenette Greet on Apr 14, 2021.  Surgeon's office encouraged pt to follow-up in our clinic. Surgeon has been treating patient for cellulitis of left breast. Pt endorses improvement in left breast; still notes redness. She is taking Augmentin as directed. She will be evaluated by surgery for wound check next week. ? ?Apt made for 07/07/21 at 1:30 pm per patient's request ?

## 2021-06-29 ENCOUNTER — Other Ambulatory Visit: Payer: Self-pay

## 2021-06-29 ENCOUNTER — Inpatient Hospital Stay: Payer: BC Managed Care – PPO | Attending: Oncology

## 2021-06-29 ENCOUNTER — Inpatient Hospital Stay: Payer: BC Managed Care – PPO

## 2021-06-29 DIAGNOSIS — C50912 Malignant neoplasm of unspecified site of left female breast: Secondary | ICD-10-CM | POA: Diagnosis not present

## 2021-06-29 DIAGNOSIS — Z170 Estrogen receptor positive status [ER+]: Secondary | ICD-10-CM | POA: Diagnosis not present

## 2021-06-29 DIAGNOSIS — Z5111 Encounter for antineoplastic chemotherapy: Secondary | ICD-10-CM | POA: Diagnosis not present

## 2021-06-29 DIAGNOSIS — N61 Mastitis without abscess: Secondary | ICD-10-CM | POA: Diagnosis not present

## 2021-06-29 LAB — CBC WITH DIFFERENTIAL/PLATELET
Abs Immature Granulocytes: 0.02 10*3/uL (ref 0.00–0.07)
Basophils Absolute: 0.1 10*3/uL (ref 0.0–0.1)
Basophils Relative: 1 %
Eosinophils Absolute: 0.1 10*3/uL (ref 0.0–0.5)
Eosinophils Relative: 2 %
HCT: 42.2 % (ref 36.0–46.0)
Hemoglobin: 13.8 g/dL (ref 12.0–15.0)
Immature Granulocytes: 0 %
Lymphocytes Relative: 17 %
Lymphs Abs: 1.1 10*3/uL (ref 0.7–4.0)
MCH: 30.1 pg (ref 26.0–34.0)
MCHC: 32.7 g/dL (ref 30.0–36.0)
MCV: 91.9 fL (ref 80.0–100.0)
Monocytes Absolute: 0.7 10*3/uL (ref 0.1–1.0)
Monocytes Relative: 11 %
Neutro Abs: 4.4 10*3/uL (ref 1.7–7.7)
Neutrophils Relative %: 69 %
Platelets: 433 10*3/uL — ABNORMAL HIGH (ref 150–400)
RBC: 4.59 MIL/uL (ref 3.87–5.11)
RDW: 11.8 % (ref 11.5–15.5)
WBC: 6.3 10*3/uL (ref 4.0–10.5)
nRBC: 0 % (ref 0.0–0.2)

## 2021-06-29 LAB — COMPREHENSIVE METABOLIC PANEL
ALT: 21 U/L (ref 0–44)
AST: 20 U/L (ref 15–41)
Albumin: 4.6 g/dL (ref 3.5–5.0)
Alkaline Phosphatase: 80 U/L (ref 38–126)
Anion gap: 7 (ref 5–15)
BUN: 18 mg/dL (ref 6–20)
CO2: 31 mmol/L (ref 22–32)
Calcium: 9.6 mg/dL (ref 8.9–10.3)
Chloride: 100 mmol/L (ref 98–111)
Creatinine, Ser: 0.96 mg/dL (ref 0.44–1.00)
GFR, Estimated: >60 mL/min (ref >60)
Glucose, Bld: 103 mg/dL — ABNORMAL HIGH (ref 70–99)
Potassium: 3.8 mmol/L (ref 3.5–5.1)
Sodium: 138 mmol/L (ref 135–145)
Total Bilirubin: 0.3 mg/dL (ref 0.3–1.2)
Total Protein: 8 g/dL (ref 6.5–8.1)

## 2021-06-29 MED ORDER — GOSERELIN ACETATE 3.6 MG ~~LOC~~ IMPL
3.6000 mg | DRUG_IMPLANT | SUBCUTANEOUS | Status: DC
  Administered 2021-06-29: 3.6 mg via SUBCUTANEOUS
  Filled 2021-06-29: qty 3.6

## 2021-06-30 LAB — FSH/LH
FSH: 7.1 m[IU]/mL
LH: <0.3 m[IU]/mL

## 2021-06-30 LAB — ESTRADIOL: Estradiol: <5 pg/mL

## 2021-07-02 DIAGNOSIS — N61 Mastitis without abscess: Secondary | ICD-10-CM | POA: Diagnosis not present

## 2021-07-07 ENCOUNTER — Inpatient Hospital Stay: Payer: BC Managed Care – PPO | Attending: Oncology | Admitting: Occupational Therapy

## 2021-07-07 DIAGNOSIS — Z5111 Encounter for antineoplastic chemotherapy: Secondary | ICD-10-CM | POA: Insufficient documentation

## 2021-07-07 DIAGNOSIS — Z170 Estrogen receptor positive status [ER+]: Secondary | ICD-10-CM | POA: Insufficient documentation

## 2021-07-07 DIAGNOSIS — C50912 Malignant neoplasm of unspecified site of left female breast: Secondary | ICD-10-CM | POA: Insufficient documentation

## 2021-07-07 DIAGNOSIS — M79602 Pain in left arm: Secondary | ICD-10-CM | POA: Insufficient documentation

## 2021-07-07 DIAGNOSIS — I890 Lymphedema, not elsewhere classified: Secondary | ICD-10-CM

## 2021-07-07 DIAGNOSIS — M25612 Stiffness of left shoulder, not elsewhere classified: Secondary | ICD-10-CM | POA: Insufficient documentation

## 2021-07-07 DIAGNOSIS — L905 Scar conditions and fibrosis of skin: Secondary | ICD-10-CM

## 2021-07-07 NOTE — Therapy (Signed)
[place] ?[facility] ?[address], [address] ?[place], [place], [address] ?Phone: [phone]   Fax:  [phone] ? ?Occupational Therapy Evaluation ? ?Patient Details  ?Name: Megan Cummings ?MRN: [id number] ?Date of Birth: 26-Mar-1976 ?Referring Provider (OT): Dr Windell Moment ? ? ?Encounter Date: 07/07/2021 ? ? OT End of Session - 07/07/21 1406   ? ? Visit Number 0   ? ?  ?  ? ?  ? ? ?Past Medical History:  ?Diagnosis Date  ? Basal cell carcinoma 09/21/2015  ? Left anterior shoulder. Superficial and nodular patterns.  ? Basal cell carcinoma 09/28/2015  ? Left spinal upper back. Superficial.  ? Basal cell carcinoma 12/06/2016  ? Right forearm. Superficial.   ? Breast cancer ([place])   ? Left  ? Complication of anesthesia   ? woke up during colonoscopy  ? Dysplastic nevus 12/06/2016  ? Right spinal mid back. Severe atypia and halo nevus effect, margin involved. Excised: 02/13/2017. Margins free  ? Dysplastic nevus 07/04/2017  ? Right upper back. Moderate atypia, close to margin.  ? Family history of colon cancer   ? Family history of lung cancer   ? Fibrocystic breast   ? History of kidney stones   ? h/o  ? Stress incontinence   ? Umbilical hernia 82/5003  ? ? ?Past Surgical History:  ?Procedure Laterality Date  ? BREAST BIOPSY Left 01/13/2021  ? [place] bx/ ribbon clip/ path pending  ? COLONOSCOPY N/A 11/18/2020  ? Procedure: COLONOSCOPY;  Surgeon: Virgel Manifold, MD;  Location: [facility] ENDOSCOPY;  Service: Endoscopy;  Laterality: N/A;  ? FOOT SURGERY Right 07/04/2010  ? PART MASTECTOMY,RADIO FREQUENCY LOCALIZER,AXILLARY SENTINEL NODE BIOPSY Left 01/25/2021  ? Procedure: PART MASTECTOMY,RADIO FREQUENCY LOCALIZER,AXILLARY SENTINEL NODE BIOPSY;  Surgeon: Herbert Pun, MD;  Location: [facility] ORS;  Service: General;  Laterality: Left;  ? ? ?There were no vitals filed for this visit. ? ? Subjective Assessment - 07/07/21 1404   ? ? Subjective  Since have seen you in January I was  doing increased weights, and CrossFit stretches and also  rope climbing with no issues.  No doing yoga.  But then about 2 weeks ago I did child's pose ,I stretches and rotated my arm and I felt like a really good stretch or release to my armpit.  But then that night had increased swelling and redness and pain in my underarm and breast.  On my second round of antibiotics now.  After 2 days left.  And now I cannot reach with my arm more than 90 degrees and I feel a pull down the bottom of my breast into my scar.   ? Pertinent History Malignant neoplasm of lower-inner quadrant of left female breast, unspecified estrogen receptor status (CMS-HCC) (C50.312)   Stage IA (pT1c, pN0, cM0, G1, ER+, PR+, HER2-)   -S/p left breast partial mastectomy and sentinel needle biopsy on 01/25/2021 by Dr Peyton Najjar  -Patient evaluated by radiation oncology. Radiation therapy starting this week - not going to have  chemotherapy   -She will eventually benefit of antiestrogen therapy  -Due to her pain and tightness on the left forearm, recommendation is to be evaluated byOccupational  therapy.  -Otherwise there is no significant sign of complication at this moment. We will follow closely.  -Patient oriented about Chemo-Port in case she will need chemotherapy.   ? Patient Stated Goals I want my motion and pain better so I can do cross fit   ? Currently in Pain?  Yes   ? Pain Score 5    ? Pain Location Breast   ? Pain Orientation Left   ? Pain Descriptors / Indicators Tightness;Tender   ? Pain Type Surgical pain   ? ?  ?  ? ?  ? ? ? ? ?IMPRESSION Dr Tildon Husky surgeon on 06/22/21: ? ?Cellulitis of breast [N61.0] ?-All improving but still recurrent seroma. It was aspirated. Seroma still with appearance. ?-I discussed with the patient the alternative of doing incision and drainage in the operating room for deep cleaning of the area of the seroma to see if this help it to continue recovering. Patient elected to try another aspiration. We will continue  to follow closely.  ? ?PLAN:  ?1. You can roll a piece of gauze and put it over the swelling area to apply mild pressure ?2. Continue taking antibiotic therapy as prescribed ?3. Keep area clean with soap and water ?4. I will see you in 1 week for wound follow-up ? ? ?OT SCREEN 07/07/21: ?Patient was not seen since January after finishing radiation.  She did had some scar tissue with some fibrosis but was coming out of radiation and could not do a lot of soft tissue massage.  Did at that time recommend patient over-the-counter preventative sleeve for workouts and CrossFit.  Left upper extremity range of motion within normal limits.  Did recommend to Jovi pack at that time. ?Patient did not return for follow-up. ?Patient is date seen after she had some seroma, mastitis as well as 2 rounds of antibiotic with aspiration of seroma 3-4 times per patient since March. ?She remember doing chills post stretch with arms into endrange flexion felt a great release in axilla with increased swelling and pain the night. ?Patient tender around fibrosis area and scar tissue 5/10 tenderness.  Patient on fifth to 6-day of antibiotic.  Patient appointment with Dr. Peyton Najjar tomorrow. ?Recommend for patient to get a Jovi pack unilateral postmastectomy pad to wear under compression as much as she can.  Gentle active range of motion in supine for abduction flexion and external rotation keeping pain under 2/10. ?Continue to wear preventative sleeve.  And can start after seeing surgeon and infection is cleared-can do soft tissue massage circular, up and down.  Patient out of town next week on vacation ?We will follow-up with me on 18 April. ? ? ? ? ? ? ? ? ? ? ? ? ? ? ? ? ? ? ? ? ? ? ? ? ? ? ? ? ? ? ?Patient will benefit from skilled therapeutic intervention in order to improve the following deficits and impairments:   ?  ?  ?  ? ? ?Visit Diagnosis: ?Scar tissue ? ?Lymphedema, not elsewhere classified ? ? ? ?Problem List ?Patient Active Problem  List  ? Diagnosis Date Noted  ? Encounter for hepatitis C screening test for low risk patient 03/25/2021  ? Annual physical exam 03/25/2021  ? Encounter for lipid screening for cardiovascular disease 03/25/2021  ? Stress and adjustment reaction 03/25/2021  ? Dizziness, nonspecific 03/25/2021  ? Malignant neoplasm of breast, stage 1, estrogen receptor positive, left ([place]) 01/22/2021  ? ? ?Rosalyn Gess, OTR/L,CLT ?07/07/2021, 2:07 PM ? ?[place] ?[facility] ?[address], [address] ?[place], [place], [address] ?Phone: [phone]   Fax:  [phone] ? ?Name: Megan Cummings ?MRN: [id number] ?Date of Birth: July 06, 1975 ? ?

## 2021-07-08 ENCOUNTER — Other Ambulatory Visit: Payer: Self-pay | Admitting: General Surgery

## 2021-07-08 DIAGNOSIS — N63 Unspecified lump in unspecified breast: Secondary | ICD-10-CM | POA: Diagnosis not present

## 2021-07-20 ENCOUNTER — Ambulatory Visit: Payer: BC Managed Care – PPO | Attending: General Surgery | Admitting: Occupational Therapy

## 2021-07-20 ENCOUNTER — Encounter: Payer: Self-pay | Admitting: Occupational Therapy

## 2021-07-20 DIAGNOSIS — M25612 Stiffness of left shoulder, not elsewhere classified: Secondary | ICD-10-CM | POA: Insufficient documentation

## 2021-07-20 DIAGNOSIS — M79602 Pain in left arm: Secondary | ICD-10-CM | POA: Insufficient documentation

## 2021-07-20 DIAGNOSIS — L905 Scar conditions and fibrosis of skin: Secondary | ICD-10-CM | POA: Insufficient documentation

## 2021-07-20 NOTE — Therapy (Signed)
[place] ?[facility] ?[address] ?[place], [place], [address] ?Phone: [phone]   Fax:  [phone] ? ?Occupational Therapy Treatment ? ?Patient Details  ?Name: Megan Cummings ?MRN: [id number] ?Date of Birth: [birth year] ?Referring Provider (OT): Dr Windell Moment ? ? ?Encounter Date: 07/20/2021 ? ? OT End of Session - 07/20/21 1000   ? ? Visit Number 7   ? Number of Visits 13   ? Date for OT Re-Evaluation 08/31/21   ? OT Start Time 0820   ? OT Stop Time 0900   ? OT Time Calculation (min) 40 min   ? Activity Tolerance Patient tolerated treatment well   ? Behavior During Therapy [facility] for tasks assessed/performed   ? ?  ?  ? ?  ? ? ?Past Medical History:  ?Diagnosis Date  ? Basal cell carcinoma 09/21/2015  ? Left anterior shoulder. Superficial and nodular patterns.  ? Basal cell carcinoma 09/28/2015  ? Left spinal upper back. Superficial.  ? Basal cell carcinoma 12/06/2016  ? Right forearm. Superficial.   ? Breast cancer ([place])   ? Left  ? Complication of anesthesia   ? woke up during colonoscopy  ? Dysplastic nevus 12/06/2016  ? Right spinal mid back. Severe atypia and halo nevus effect, margin involved. Excised: 02/13/2017. Margins free  ? Dysplastic nevus 07/04/2017  ? Right upper back. Moderate atypia, close to margin.  ? Family history of colon cancer   ? Family history of lung cancer   ? Fibrocystic breast   ? History of kidney stones   ? h/o  ? Stress incontinence   ? Umbilical hernia 55/9741  ? ? ?Past Surgical History:  ?Procedure Laterality Date  ? BREAST BIOPSY Left 01/13/2021  ? [place] bx/ ribbon clip/ path pending  ? COLONOSCOPY N/A 11/18/2020  ? Procedure: COLONOSCOPY;  Surgeon: Virgel Manifold, MD;  Location[facility] ENDOSCOPY;  Service: Endoscopy;  Laterality: N/A;  ? FOOT SURGERY Right 07/04/2010  ? PART MASTECTOMY,RADIO FREQUENCY LOCALIZER,AXILLARY SENTINEL NODE BIOPSY Left 01/25/2021  ? Procedure: PART MASTECTOMY,RADIO FREQUENCY LOCALIZER,AXILLARY  SENTINEL NODE BIOPSY;  Surgeon: Herbert Pun, MD;  Location: [facility] ORS;  Service: General;  Laterality: Left;  ? ? ?There were no vitals filed for this visit. ? ? Subjective Assessment - 07/20/21 0954   ? ? Subjective  Since have seen you in January I was doing increased weights, and CrossFit stretches and also  rope climbing with no issues.  Now doing yoga.  But then about 3 weeks ago I did child's pose , I stretches and rotated my arm and I felt like a really good stretch or release to my armpit.  But then that night had increased swelling and redness and pain in my underarm and breast.  Done with my second round of antibiotics now. Ultrasounds sceduled for next week. Now I cannot reach with my arm more than 90 degrees and I feel a pull  in my armpit, top of breast  and down the bottom of my breast into my scar. 5-7/10 pain - tender too   ? Pertinent History Megan Cummings is a 46 y.o.female patient who comes for follow up her left breast mastitis.    Patient with left mastitis with seroma. We have been doing serial aspirations with slowly improvement. Today there is no significant fluid to aspirate. The erythema is also improving. The pain is also improving. She still has significant stiffness and pain on the lateral aspect of the left breast that extends  to the left axillary area  - last visit 07/08/21 and follow up again surgeon tomorrow   ? Patient Stated Goals I want my motion and pain better so I can do cross fit   ? Currently in Pain? Yes   ? Pain Score 5    ? Pain Location Axilla   breast  ? Pain Orientation Left   ? Pain Descriptors / Indicators Tightness;Tender;Shooting   ? Pain Type Surgical pain   ? Pain Onset More than a month ago   ? Pain Frequency Intermittent   ? ?  ?  ? ?  ? ? ? ? [facility] OT Assessment - 07/20/21 0001   ? ?  ? AROM  ? Left Shoulder Flexion 145 Degrees   ? Left Shoulder ABduction 85 Degrees   in session 115  ? ?  ?  ? ?  ? ? ? ? ? NOTE FROM last seen  in January :  with Great  progress in AROM for L UE - WNL for shoulder flexion , ABD and ext rotation  ?Pt report still pull over L pect and axilla - ask OT to assess if cording and release  ?Upon assessment - notcording - mostly some tightness still from surgery and radiation ?Recommend pt to do some yoga for flexibility - pt report she is schedule to start in Febr ?SHe is doing some more Cross fit -but some of the exercises not able to do -like pull ups ?Explain for pt to work gradually into strengthening ?Pt do report some swelling post work out under arm pit- pt to monitor and let me know if  cont to be issue - would recommend jovipak breast pad to wear to decrease chest wall and thoracic from lymphedema and decrease risk of it moving into L UE ?Pt L UE upper arm cont to be increase - pt is R hand dominant ? Cont to monitor -if pt need over the counter preventive sleeve for high risk activities - pt to check arm post work out and let me know ?Pt to follow up 2-4 wks with me ? ? ?  ? ? NOW pt return after developing some issues with cellulitis/seroma/mastitis in left breast. ?Patient  had 2 rounds of antibiotic with aspiration of seroma 3-4 times per patient since March. ?She remember doing yoga stretched with arms into endrange flexion felt a great release in axilla with increased swelling and pain the night. ?Patient tender around fibrosis area and scar tissue 5-7/10 tenderness.  ?Patient very tender on superior breast into the left pec and lateral breast.  Not tolerating soft tissue but feel a stretch with abduction and external rotation left shoulder.  Patient scheduled for ultrasound for/25/23.  ?Patient reports she had in the past before her breast cancer diagnosis ultrasound that showed cysts on the lateral breast, do not know what the plan is for that.  ? Patient appointment with Dr. Peyton Najjar tomorrow. ?Patient did get yesterday Jovi pack postmastectomy pad to wear under compression as much as she can.  Also made today for her to  back to wear over the fibrotic area where scar tissue and fibrosis is hard ?Marland Kitchen   ?To do today soft tissue massage, scar massage and mini massager over scar from 6-9:00 on left breast tolerating massage and scar better than any attempts of soft tissue on superior breast lateral left breast and axilla.   ?Gentle active assisted range of motion in supine for abduction ,flexion and external rotation keeping pain  under 2/10. ?Patient did show increase abduction from 85 degrees to 115 end of session. ?Continue to wear preventative sleeve.  We will reassess circumference of left upper extremity in extension. ?Patient can do scar massage and active assisted range of motion using golf club in supine for shoulder flexion shoulder abduction and external rotation stretch-keeping pain under 2 out of 10. ?Await results from visit with surgeon tomorrow and ultrasound next week.  Doing more conservative soft tissue until then. ? ? ? ? ? ? ? ? ? ? OT Education - 07/20/21 1000   ? ? Education Details changes in HEP , soft tissue, ROM and scar tissue massage   ? Person(s) Educated Patient   ? Methods Explanation;Demonstration;Tactile cues;Verbal cues;Handout   ? Comprehension Returned demonstration;Verbal cues required   ? ?  ?  ? ?  ? ? ? OT Short Term Goals - 07/20/21 1015   ? ?  ? OT SHORT TERM GOAL #1  ? Title Patient to be independent in the home program doing scar tissue massage, compression for left breast to decrease pain less than 2/10 with range of motion touch   ? Baseline Unable to tolerate any soft tissue or palpation on L lateral breast superior breast into axilla.  5-7/10 pain with abduction of L shoulder to 90 and scar massage or soft tissue on L breast and pec   ? Time 4   ? Period Weeks   ? Status New   ? Target Date 08/17/21   ? ?  ?  ? ?  ? ? ? ? OT Long Term Goals - 07/20/21 1011   ? ?  ? OT LONG TERM GOAL #1  ? Title Pain in L arm decrease to less than 3/10 to do pull over shirt and do hair   ? Status Achieved   ?   ? OT LONG TERM GOAL #2  ? Title Cording in L UE decrease for pt to show WNL L UE AROM to return to cross fit or ADL's/IADL"S   ? Status Achieved   ?  ? OT LONG TERM GOAL #3  ? Status Achieved   ?  ? OT

## 2021-07-22 DIAGNOSIS — N61 Mastitis without abscess: Secondary | ICD-10-CM | POA: Diagnosis not present

## 2021-07-22 DIAGNOSIS — N63 Unspecified lump in unspecified breast: Secondary | ICD-10-CM | POA: Diagnosis not present

## 2021-07-26 ENCOUNTER — Ambulatory Visit: Payer: BC Managed Care – PPO | Admitting: Occupational Therapy

## 2021-07-26 ENCOUNTER — Encounter: Payer: Self-pay | Admitting: Occupational Therapy

## 2021-07-26 DIAGNOSIS — M79602 Pain in left arm: Secondary | ICD-10-CM

## 2021-07-26 DIAGNOSIS — L905 Scar conditions and fibrosis of skin: Secondary | ICD-10-CM

## 2021-07-26 DIAGNOSIS — M25612 Stiffness of left shoulder, not elsewhere classified: Secondary | ICD-10-CM | POA: Diagnosis not present

## 2021-07-26 NOTE — Therapy (Signed)
[place] ?[facility] ?[address] ?[place], [place], [address] ?Phone: [phone]   Fax:  [phone] ? ?Occupational Therapy Treatment ? ?Patient Details  ?Name: Megan Cummings ?MRN: [id number] ?Date of Birth: May 16, 1975 ?Referring Provider (OT): Dr Windell Moment ? ? ?Encounter Date: 07/26/2021 ? ? OT End of Session - 07/26/21 9983   ? ? Visit Number 8   ? Number of Visits 13   ? Date for OT Re-Evaluation 08/31/21   ? OT Start Time [id number]   ? OT Stop Time 0900   ? OT Time Calculation (min) 43 min   ? Activity Tolerance Patient tolerated treatment well   ? Behavior During Therapy [facility] for tasks assessed/performed   ? ?  ?  ? ?  ? ? ?Past Medical History:  ?Diagnosis Date  ? Basal cell carcinoma 09/21/2015  ? Left anterior shoulder. Superficial and nodular patterns.  ? Basal cell carcinoma 09/28/2015  ? Left spinal upper back. Superficial.  ? Basal cell carcinoma 12/06/2016  ? Right forearm. Superficial.   ? Breast cancer ([place])   ? Left  ? Complication of anesthesia   ? woke up during colonoscopy  ? Dysplastic nevus 12/06/2016  ? Right spinal mid back. Severe atypia and halo nevus effect, margin involved. Excised: 02/13/2017. Margins free  ? Dysplastic nevus 07/04/2017  ? Right upper back. Moderate atypia, close to margin.  ? Family history of colon cancer   ? Family history of lung cancer   ? Fibrocystic breast   ? History of kidney stones   ? h/o  ? Stress incontinence   ? Umbilical hernia 08/3974  ? ? ?Past Surgical History:  ?Procedure Laterality Date  ? BREAST BIOPSY Left 01/13/2021  ? [place] bx/ ribbon clip/ path pending  ? COLONOSCOPY N/A 11/18/2020  ? Procedure: COLONOSCOPY;  Surgeon: Virgel Manifold, MD;  Location[facility] ENDOSCOPY;  Service: Endoscopy;  Laterality: N/A;  ? FOOT SURGERY Right 07/04/2010  ? PART MASTECTOMY,RADIO FREQUENCY LOCALIZER,AXILLARY SENTINEL NODE BIOPSY Left 01/25/2021  ? Procedure: PART MASTECTOMY,RADIO FREQUENCY LOCALIZER,AXILLARY  SENTINEL NODE BIOPSY;  Surgeon: Herbert Pun, MD;  Location: [facility] ORS;  Service: General;  Laterality: Left;  ? ? ?There were no vitals filed for this visit. ? ? Subjective Assessment - 07/26/21 0926   ? ? Subjective  I seen the surgeon after seeing you last time.  I am still scheduled for the ultrasound tomorrow.  He did not think I needed it but I do wanted to have it.  I still a tenderness lateral side of my breast.  And then when I reached to the side or back it pulled so bad in the top of my breast down to the scar.  I do think I have low more motion.   ? Pertinent History Megan Cummings is a 46 y.o.female patient who comes for follow up her left breast mastitis.    Patient with left mastitis with seroma. We have been doing serial aspirations with slowly improvement. Today there is no significant fluid to aspirate. The erythema is also improving. The pain is also improving. She still has significant stiffness and pain on the lateral aspect of the left breast that extends to the left axillary area  - last visit 07/08/21 and follow up again surgeon tomorrow   ? Patient Stated Goals I want my motion and pain better so I can do cross fit   ? Currently in Pain? Yes   ? Pain Score 2    ?  Pain Location Breast   ? Pain Orientation Left   ? Pain Descriptors / Indicators Tender;Tightness   ? Pain Type Surgical pain   ? Pain Onset More than a month ago   ? Pain Frequency Intermittent   ? ?  ?  ? ?  ? ? ? ? ? ? LYMPHEDEMA/ONCOLOGY QUESTIONNAIRE - 07/26/21 0001   ? ?  ? Right Upper Extremity Lymphedema  ? 15 cm Proximal to Olecranon Process 29.5 cm   ? 10 cm Proximal to Olecranon Process 28 cm   ? Olecranon Process 25 cm   ? 15 cm Proximal to Ulnar Styloid Process 22.7 cm   ?  ? Left Upper Extremity Lymphedema  ? 15 cm Proximal to Olecranon Process 30 cm   ? 10 cm Proximal to Olecranon Process 28.5 cm   ? Olecranon Process 25 cm   ? 15 cm Proximal to Ulnar Styloid Process 22.4 cm   ? ?  ?  ? ?  ? ? ? ? ?  ? NOTE FROM  last seen  in January :  with Great progress in AROM for L UE - WNL for shoulder flexion , ABD and ext rotation  ?Pt report still pull over L pect and axilla - ask OT to assess if cording and release  ?Upon assessment - notcording - mostly some tightness still from surgery and radiation ?Recommend pt to do some yoga for flexibility - pt report she is schedule to start in Febr ?SHe is doing some more Cross fit -but some of the exercises not able to do -like pull ups ?Explain for pt to work gradually into strengthening ?Pt do report some swelling post work out under arm pit- pt to monitor and let me know if  cont to be issue - would recommend jovipak breast pad to wear to decrease chest wall and thoracic from lymphedema and decrease risk of it moving into L UE ?Pt L UE upper arm cont to be increase - pt is R hand dominant ? Cont to monitor -if pt need over the counter preventive sleeve for high risk activities - pt to check arm post work out and let me know ?Pt to follow up 2-4 wks with me ?  ?  ?  ?  ? Patient tender around fibrosis area and scar tissue 2-6/10 tenderness.  ?Patient very tender on superior breast into the left pec and lateral breast.  Not tolerating soft tissue but feel a stretch with abduction and external rotation left shoulder.  Patient scheduled for ultrasound tomorrow.  ?Patient reports she had in the past before her breast cancer diagnosis ultrasound that showed cysts on the lateral breast, do not know what the plan is for that.  ?Held off this date with soft tissue in axilla and lateral breast ? Patient had appointment with Dr. Peyton Najjar last week ?Patient wearing since last time jovi pack postmastectomy pad under compression as much as she can.  Also made t her last time  chip bag to wear over the fibrotic area where scar tissue and fibrosis is hard ?Marland Kitchen   ?To do today soft tissue massage, scar massage and mini massager over scar from 6-9:00 on left breast tolerating massage and scar mobs better  than last time ? Held off any soft tissue on lateral breast until [place] ?Did do some on pect and upper arm .   ?Gentle active assisted range of motion in supine for abduction ,flexion and external rotation keeping pain under 2/10. ?  Patient did show increase abduction from 85 degrees last week to walking in 105 and in session 120  ?Shoulder flexion 160 ?Add scapula retraction/protraction in supine but make sure drop scapula . ?Continue to wear preventative sleeve.  Circumference still about the same - less than 1 cm increase in L upper arm ? Cont to monitor lymphedema measurements . ?Patient can do scar massage and active assisted range of motion using golf club in supine for shoulder flexion ,shoulder abduction and external rotation stretch-keeping pain under 2 out of 10. ?Await results from ultrasound tomorrow ?  Doing more conservative soft tissue until then. ?  ?  ?  ? ? ? ? ? ? ? ? ? ? ? ? ? OT Education - 07/26/21 0938   ? ? Education Details changes in HEP , soft tissue, ROM and scar tissue massage   ? Person(s) Educated Patient   ? Methods Explanation;Demonstration;Tactile cues;Verbal cues;Handout   ? Comprehension Returned demonstration;Verbal cues required   ? ?  ?  ? ?  ? ? ? OT Short Term Goals - 07/20/21 1015   ? ?  ? OT SHORT TERM GOAL #1  ? Title Patient to be independent in the home program doing scar tissue massage, compression for left breast to decrease pain less than 2/10 with range of motion touch   ? Baseline Unable to tolerate any soft tissue or palpation on L lateral breast superior breast into axilla.  5-7/10 pain with abduction of L shoulder to 90 and scar massage or soft tissue on L breast and pec   ? Time 4   ? Period Weeks   ? Status New   ? Target Date 08/17/21   ? ?  ?  ? ?  ? ? ? ? OT Long Term Goals - 07/20/21 1011   ? ?  ? OT LONG TERM GOAL #1  ? Title Pain in L arm decrease to less than 3/10 to do pull over shirt and do hair   ? Status Achieved   ?  ? OT LONG TERM GOAL #2  ? Title  Cording in L UE decrease for pt to show WNL L UE AROM to return to cross fit or ADL's/IADL"S   ? Status Achieved   ?  ? OT LONG TERM GOAL #3  ? Status Achieved   ?  ? OT LONG TERM GOAL #4  ? Title Monitor

## 2021-07-27 ENCOUNTER — Inpatient Hospital Stay: Payer: BC Managed Care – PPO

## 2021-07-27 ENCOUNTER — Ambulatory Visit
Admission: RE | Admit: 2021-07-27 | Discharge: 2021-07-27 | Disposition: A | Discharge status: Home / Self Care | Payer: BC Managed Care – PPO | Source: Ambulatory Visit | Attending: General Surgery | Admitting: General Surgery

## 2021-07-27 ENCOUNTER — Encounter: Payer: Self-pay | Admitting: Oncology

## 2021-07-27 ENCOUNTER — Inpatient Hospital Stay (HOSPITAL_BASED_OUTPATIENT_CLINIC_OR_DEPARTMENT_OTHER): Payer: BC Managed Care – PPO | Admitting: Oncology

## 2021-07-27 ENCOUNTER — Other Ambulatory Visit: Payer: Self-pay | Admitting: *Deleted

## 2021-07-27 VITALS — BP 160/90 | HR 67 | Temp 96.7°F | Resp 16 | Wt 158.4 lb

## 2021-07-27 DIAGNOSIS — Z853 Personal history of malignant neoplasm of breast: Secondary | ICD-10-CM | POA: Diagnosis not present

## 2021-07-27 DIAGNOSIS — N63 Unspecified lump in unspecified breast: Secondary | ICD-10-CM | POA: Insufficient documentation

## 2021-07-27 DIAGNOSIS — C50912 Malignant neoplasm of unspecified site of left female breast: Secondary | ICD-10-CM

## 2021-07-27 DIAGNOSIS — R922 Inconclusive mammogram: Secondary | ICD-10-CM | POA: Diagnosis not present

## 2021-07-27 DIAGNOSIS — N644 Mastodynia: Secondary | ICD-10-CM | POA: Diagnosis not present

## 2021-07-27 DIAGNOSIS — M25612 Stiffness of left shoulder, not elsewhere classified: Secondary | ICD-10-CM | POA: Diagnosis not present

## 2021-07-27 DIAGNOSIS — I890 Lymphedema, not elsewhere classified: Secondary | ICD-10-CM | POA: Diagnosis not present

## 2021-07-27 DIAGNOSIS — Z170 Estrogen receptor positive status [ER+]: Secondary | ICD-10-CM | POA: Diagnosis not present

## 2021-07-27 DIAGNOSIS — M79602 Pain in left arm: Secondary | ICD-10-CM | POA: Diagnosis not present

## 2021-07-27 DIAGNOSIS — Z5111 Encounter for antineoplastic chemotherapy: Secondary | ICD-10-CM | POA: Diagnosis not present

## 2021-07-27 HISTORY — DX: Personal history of irradiation: Z92.3

## 2021-07-27 MED ORDER — GOSERELIN ACETATE 3.6 MG ~~LOC~~ IMPL
3.6000 mg | DRUG_IMPLANT | SUBCUTANEOUS | Status: AC
  Administered 2021-07-27: 3.6 mg via SUBCUTANEOUS
  Filled 2021-07-27 (×2): qty 3.6

## 2021-07-29 ENCOUNTER — Encounter: Payer: Self-pay | Admitting: Oncology

## 2021-07-29 NOTE — Progress Notes (Signed)
? ? ? ?Hematology/Oncology Consult note ?[place]  ?[phone] [phone] ? ?Patient Care Team: ?Gwyneth Sprout, FNP as PCP - General (Family Medicine) ?Noreene Filbert, MD as Consulting Physician (Radiation Oncology) ?Sindy Guadeloupe, MD as Consulting Physician (Oncology) ?Herbert Pun, MD as Consulting Physician (General Surgery)  ? ?Name of the patient: Megan Cummings  ?[id number]  ?[birth year]  ? ?Date of visit: 07/29/21 ? 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 ?Chief complaint/ Reason for visit- routine f/u of breast cancer to receive zoladex ? ?Heme/Onc history: Patient is a 46 year old premenopausal female who recently underwent a diagnostic bilateral mammogram after she complained of pain in her left breast.  Mammogram showed innumerable cysts in the left breast the largest of which was 1.2 x 1.1 x 1.4 cm.  Targeted ultrasound of the inner left breast showed a hypoechoic mass measuring 1 x 0.8 x 1.9 cm.  No definite lymphadenopathy noted in the left axilla.  No suspicious findings in the right breast.  She has had her last mammogram in 2019 when was again noted to have bilateral breast cysts and subsequent ultrasounds showed benign cysts with no suspicious lesions.Ultrasound-guided biopsy of the left breast mass showed invasive mammary carcinoma 6 mm grade 1 ER 81 to 90% positive PR 91 200% positive and HER2 equivocal by IHC and FISH testing negative ?  ?Menarche at the age of 49.  She is G1, P1 L1.  No use of birth control.  No prior hysterectomy.  She is still premenopausal and gets her menstrual cycles regularly.  Family history significant for colon cancer in paternal grandmother and lung cancer or lymphoma in maternal grandfather. ?  ?Final lumpectomy pathology showed 13 mm grade 1 invasive mammary carcinoma with negative margins.  3 sentinel lymph nodes negative for  malignancy.  No lymphovascular invasion.  Oncotype score came back at 16And chemotherapy benefit for this score in patients less than 60 years of age would be ~1.6%.  Adjuvant chemotherapy was not recommended.  Patient completed adjuvant radiation treatment.  Plan is for ovarian suppression plus AI.Patient wil start arimidex in April 2023 ?  ? ?Interval history- tolerating Zoladex well so far.  Has occasional fatigue.  She also had seroma reaccumulated in her left breast which required drainage but subsequently has healed well.  She is back to working out.  She had her menstrual cycle after the first dose of Zoladex but has not had it in the last month. ? ?ECOG PS- 0 ?Pain scale- 0 ? ? ?Review of systems- Review of Systems  ?Constitutional:  Negative for chills, fever, malaise/fatigue and weight loss.  ?HENT:  Negative for congestion, ear discharge and nosebleeds.   ?Eyes:  Negative for blurred vision.  ?Respiratory:  Negative for cough, hemoptysis, sputum production, shortness of breath and wheezing.   ?Cardiovascular:  Negative for chest pain, palpitations, orthopnea and claudication.  ?Gastrointestinal:  Negative for abdominal pain, blood in stool, constipation, diarrhea, heartburn, melena, nausea and vomiting.  ?Genitourinary:  Negative for dysuria, flank pain, frequency, hematuria and urgency.  ?Musculoskeletal:  Negative for back pain, joint pain and myalgias.  ?Skin:  Negative for rash.  ?Neurological:  Negative for dizziness, tingling, focal weakness, seizures, weakness and headaches.  ?Endo/Heme/Allergies:  Does not bruise/bleed easily.  ?Psychiatric/Behavioral:  Negative for depression and suicidal ideas. The patient does not have insomnia.    ? ? ?Allergies  ?Allergen Reactions  ? Sulfa Antibiotics Rash  ? ? ? ?  Past Medical History:  ?Diagnosis Date  ? Basal cell carcinoma 09/21/2015  ? Left anterior shoulder. Superficial and nodular patterns.  ? Basal cell carcinoma 09/28/2015  ? Left spinal upper back.  Superficial.  ? Basal cell carcinoma 12/06/2016  ? Right forearm. Superficial.   ? Breast cancer ([place])   ? Left  ? Complication of anesthesia   ? woke up during colonoscopy  ? Dysplastic nevus 12/06/2016  ? Right spinal mid back. Severe atypia and halo nevus effect, margin involved. Excised: 02/13/2017. Margins free  ? Dysplastic nevus 07/04/2017  ? Right upper back. Moderate atypia, close to margin.  ? Family history of colon cancer   ? Family history of lung cancer   ? Fibrocystic breast   ? History of kidney stones   ? h/o  ? Personal history of radiation therapy   ? Stress incontinence   ? Umbilical hernia 50/0370  ? ? ? ?Past Surgical History:  ?Procedure Laterality Date  ? BREAST BIOPSY Left 01/13/2021  ? [place] bx/ ribbon clip/dcis grade 1  ? BREAST LUMPECTOMY Left 01/25/2021  ? COLONOSCOPY N/A 11/18/2020  ? Procedure: COLONOSCOPY;  Surgeon: Virgel Manifold, MD;  Location: [facility] ENDOSCOPY;  Service: Endoscopy;  Laterality: N/A;  ? FOOT SURGERY Right 07/04/2010  ? PART MASTECTOMY,RADIO FREQUENCY LOCALIZER,AXILLARY SENTINEL NODE BIOPSY Left 01/25/2021  ? Procedure: PART MASTECTOMY,RADIO FREQUENCY LOCALIZER,AXILLARY SENTINEL NODE BIOPSY;  Surgeon: Herbert Pun, MD;  Location: [facility] ORS;  Service: General;  Laterality: Left;  ? ? ?Social History  ? ?Socioeconomic History  ? Marital status: Married  ?  Spouse name: Not on file  ? Number of children: Not on file  ? Years of education: Not on file  ? Highest education level: Not on file  ?Occupational History  ? Not on file  ?Tobacco Use  ? Smoking status: Former  ?  Packs/day: 0.50  ?  Years: 10.00  ?  Pack years: 5.00  ?  Types: Cigarettes  ?  Quit date: 2005  ?  Years since quitting: 18.3  ? Smokeless tobacco: Never  ?Vaping Use  ? Vaping Use: Never used  ?Substance and Sexual Activity  ? Alcohol use: Yes  ?  Comment: Occasionally drinks wine  ? Drug use: No  ? Sexual activity: Yes  ?  Birth control/protection: None  ?Other Topics Concern  ? Not on file   ?Social History Narrative  ? Not on file  ? ?Social Determinants of Health  ? ?Financial Resource Strain: Not on file  ?Food Insecurity: Not on file  ?Transportation Needs: Not on file  ?Physical Activity: Not on file  ?Stress: Not on file  ?Social Connections: Not on file  ?Intimate Partner Violence: Not on file  ? ? ?Family History  ?Problem Relation Age of Onset  ? Healthy Mother   ? Hyperlipidemia Father   ? Healthy Brother   ? Lung cancer Maternal Uncle   ? Healthy Maternal Grandmother   ? Emphysema Maternal Grandfather   ? Colon cancer Paternal Grandmother   ? Lung cancer Paternal Grandfather   ? Healthy Son   ? Breast cancer Neg Hx   ? ? ? ?Current Outpatient Medications:  ?  Calcium Carb-Cholecalciferol (CALCIUM 600 + D PO), Take 1 Dose by mouth daily. Vitamin d is 246mcg, Disp: , Rfl:  ?  HYDROcodone-acetaminophen (NORCO/VICODIN) 5-325 MG tablet, Take by mouth., Disp: , Rfl:  ?  KRILL OIL PO, Take 1,000 mg by mouth daily., Disp: , Rfl:  ?  LORazepam (ATIVAN)  0.5 MG tablet, Take 1 tablet (0.5 mg total) by mouth 2 (two) times daily as needed for anxiety., Disp: 30 tablet, Rfl: 1 ?  meloxicam (MOBIC) 15 MG tablet, Take 15 mg by mouth daily as needed., Disp: , Rfl:  ?  anastrozole (ARIMIDEX) 1 MG tablet, Take 1 tablet (1 mg total) by mouth daily. Pt to start after having 3 doses of goserelin inj. Pt's knows when to start (Patient not taking: Reported on 05/12/2021), Disp: 30 tablet, Rfl: 3 ?No current facility-administered medications for this visit. ? ?Facility-Administered Medications Ordered in Other Visits:  ?  goserelin (ZOLADEX) injection 3.6 mg, 3.6 mg, Subcutaneous, Q28 days, Sindy Guadeloupe, MD, 3.6 mg at 07/27/21 1343 ? ?Physical exam:  ?Vitals:  ? 07/27/21 1305  ?BP: (!) 160/90  ?Pulse: 67  ?Resp: 16  ?Temp: (!) 96.7 ?F (35.9 ?C)  ?SpO2: 100%  ?Weight: 158 lb 6.4 oz (71.8 kg)  ? ?Physical Exam ?Constitutional:   ?   General: She is not in acute distress. ?Cardiovascular:  ?   Rate and Rhythm: Normal  rate and regular rhythm.  ?   Heart sounds: Normal heart sounds.  ?Pulmonary:  ?   Effort: Pulmonary effort is normal.  ?   Breath sounds: Normal breath sounds.  ?Skin: ?   General: Skin is warm and dry.  ?Neurol

## 2021-08-02 ENCOUNTER — Ambulatory Visit: Payer: BC Managed Care – PPO | Attending: General Surgery | Admitting: Occupational Therapy

## 2021-08-02 DIAGNOSIS — M25512 Pain in left shoulder: Secondary | ICD-10-CM | POA: Insufficient documentation

## 2021-08-02 DIAGNOSIS — M25612 Stiffness of left shoulder, not elsewhere classified: Secondary | ICD-10-CM | POA: Insufficient documentation

## 2021-08-02 DIAGNOSIS — M62838 Other muscle spasm: Secondary | ICD-10-CM | POA: Insufficient documentation

## 2021-08-02 DIAGNOSIS — M6281 Muscle weakness (generalized): Secondary | ICD-10-CM | POA: Insufficient documentation

## 2021-08-02 DIAGNOSIS — L905 Scar conditions and fibrosis of skin: Secondary | ICD-10-CM | POA: Diagnosis not present

## 2021-08-02 DIAGNOSIS — M79602 Pain in left arm: Secondary | ICD-10-CM | POA: Diagnosis not present

## 2021-08-02 NOTE — Therapy (Signed)
[facility] ?[address] ?[place], [place], [address] ?Phone: [phone]   Fax:  [phone] ? ?Occupational Therapy Treatment ? ?Patient Details  ?Name: Megan Cummings ?MRN: [id number] ?Date of Birth: October 07, 1975 ?Referring Provider (OT): Dr Windell Moment ? ? ?Encounter Date: 08/02/2021 ? ? OT End of Session - 08/02/21 0904   ? ? Visit Number 9   ? Number of Visits 13   ? Date for OT Re-Evaluation 08/31/21   ? OT Start Time 0820   ? OT Stop Time 0900   ? OT Time Calculation (min) 40 min   ? Activity Tolerance Patient tolerated treatment well   ? Behavior During Therapy Megan Cummings for tasks assessed/performed   ? ?  ?  ? ?  ? ? ?Past Medical History:  ?Diagnosis Date  ? Basal cell carcinoma 09/21/2015  ? Left anterior shoulder. Superficial and nodular patterns.  ? Basal cell carcinoma 09/28/2015  ? Left spinal upper back. Superficial.  ? Basal cell carcinoma 12/06/2016  ? Right forearm. Superficial.   ? Breast cancer ([place])   ? Left  ? Complication of anesthesia   ? woke up during colonoscopy  ? Dysplastic nevus 12/06/2016  ? Right spinal mid back. Severe atypia and halo nevus effect, margin involved. Excised: 02/13/2017. Margins free  ? Dysplastic nevus 07/04/2017  ? Right upper back. Moderate atypia, close to margin.  ? Family history of colon cancer   ? Family history of lung cancer   ? Fibrocystic breast   ? History of kidney stones   ? h/o  ? Personal history of radiation therapy   ? Stress incontinence   ? Umbilical hernia 94/1740  ? ? ?Past Surgical History:  ?Procedure Laterality Date  ? BREAST BIOPSY Left 01/13/2021  ? [place] bx/ ribbon clip/dcis grade 1  ? BREAST LUMPECTOMY Left 01/25/2021  ? COLONOSCOPY N/A 11/18/2020  ? Procedure: COLONOSCOPY;  Surgeon: Virgel Manifold, MD;  Location: [facility] ENDOSCOPY;  Service: Endoscopy;  Laterality: N/A;  ? FOOT SURGERY Right 07/04/2010  ? PART MASTECTOMY,RADIO FREQUENCY LOCALIZER,AXILLARY SENTINEL NODE BIOPSY  Left 01/25/2021  ? Procedure: PART MASTECTOMY,RADIO FREQUENCY LOCALIZER,AXILLARY SENTINEL NODE BIOPSY;  Surgeon: Herbert Pun, MD;  Location: [facility] ORS;  Service: General;  Laterality: Left;  ? ? ?There were no vitals filed for this visit. ? ? Subjective Assessment - 08/02/21 0902   ? ? Subjective  I had my ultrasound and mammogram and he said everything looked good.  I done the stretches but I am so guarded now because of what happened.  Most of my pull is now my chest and under my arm not down into the scar.   ? Pertinent History Megan Cummings is a 46 y.o.female patient who comes for follow up her left breast mastitis.    Patient with left mastitis with seroma. We have been doing serial aspirations with slowly improvement. Today there is no significant fluid to aspirate. The erythema is also improving. The pain is also improving. She still has significant stiffness and pain on the lateral aspect of the left breast that extends to the left axillary area  - last visit 07/08/21 and follow up again surgeon tomorrow   ? Patient Stated Goals I want my motion and pain better so I can do cross fit   ? Currently in Pain? Yes   ? Pain Type Surgical pain   ? Aggravating Factors  overh head ROM ABD and flexion shoulder   ? ?  ?  ? ?  ? ? ? ? ? ?  NOTE FROM last seen  in January :  with Great progress in AROM for L UE - WNL for shoulder flexion , ABD and ext rotation  ?Pt report still pull over L pect and axilla - ask OT to assess if cording and release  ?Upon assessment - notcording - mostly some tightness still from surgery and radiation ?Recommend pt to do some yoga for flexibility - pt report she is schedule to start in Febr ?SHe is doing some more Cross fit -but some of the exercises not able to do -like pull ups ?Explain for pt to work gradually into strengthening ?Pt do report some swelling post work out under arm pit- pt to monitor and let me know if  cont to be issue - would recommend jovipak breast pad to wear to  decrease chest wall and thoracic from lymphedema and decrease risk of it moving into L UE ?Pt L UE upper arm cont to be increase - pt is R hand dominant ? Cont to monitor -if pt need over the counter preventive sleeve for high risk activities - pt to check arm post work out and let me know ?Pt to follow up 2-4 wks with me ?  ?  ?  ?  ? Patient tender around fibrosis area and scar tissue 2-6/10 tenderness.  ?Patient continues to be very tender on superior breast into the left pec and lateral breast.  Not tolerating soft tissue but feel a stretch with abduction and external rotation left shoulder.  Per patient ultrasound showed no issues.   ?Patient do report that she do not feel a pull into the scar tissue at the nipple with shoulder motion mostly now superior breast and lateral breast into axilla. ? ?Patient reports she had in the past before her breast cancer diagnosis ultrasound that showed cysts on the lateral breast, do not know what the plan is for that. But per pt [place] did not show any of that or mammogram. ?This date attempted some soft tissue myofascial over superior breast into axilla and pec but patient still very tender and painful recommend for patient to get with her massage therapist to work on upper back, left shoulder, pect , axilla and thoracic but not scar massage.  ? Patient wearing since 2 wks ago  jovi pack postmastectomy pad under compression as much as she can and alternating with chip bag to wear over the fibrotic area where scar tissue and fibrosis is hard ?Marland Kitchen   ?To do today soft tissue massage, scar massage  - but mostly soft tissue by OT -and pt ed on doing at home or husband to help.  ?Scar mobs done on scar at  6-9:00 o'clock on left breast tolerating massage and scar mobs better than last time ?Less of pull with shoulder motion into the scar and nipple. ? Gentle active assisted range of motion in supine for abduction ,flexion and external rotation keeping pain under 2/10. ?Patient reports  doing at home scapula retraction, shoulder extension some strengthening. ? . ?Continue to wear preventative sleeve.  Circumference still about the same - less than 1 cm increase in L upper arm ? Cont to monitor lymphedema measurements . ?Patient  to cont with active assisted range of motion using golf club in supine for shoulder flexion ,shoulder abduction and external rotation stretch-keeping pain under 2 out of 10. ?Still held off on kinesiotaping just because of skin changes over scar tissue since radiation as well as 2 episodes of infection/mastitis. ?  ?  ?  ?  ?  ?  ?  ?  ?  ?  ?  ?  ? ? ? ? ? ? ? ? ? ? ? ? ? ? ? ? ?  OT Education - 08/02/21 0904   ? ? Education Details changes in HEP , soft tissue, ROM and scar tissue massage   ? Person(s) Educated Patient   ? Methods Explanation;Demonstration;Tactile cues;Verbal cues;Handout   ? Comprehension Returned demonstration;Verbal cues required   ? ?  ?  ? ?  ? ? ? OT Short Term Goals - 07/20/21 1015   ? ?  ? OT SHORT TERM GOAL #1  ? Title Patient to be independent in the home program doing scar tissue massage, compression for left breast to decrease pain less than 2/10 with range of motion touch   ? Baseline Unable to tolerate any soft tissue or palpation on L lateral breast superior breast into axilla.  5-7/10 pain with abduction of L shoulder to 90 and scar massage or soft tissue on L breast and pec   ? Time 4   ? Period Weeks   ? Status New   ? Target Date 08/17/21   ? ?  ?  ? ?  ? ? ? ? OT Long Term Goals - 07/20/21 1011   ? ?  ? OT LONG TERM GOAL #1  ? Title Pain in L arm decrease to less than 3/10 to do pull over shirt and do hair   ? Status Achieved   ?  ? OT LONG TERM GOAL #2  ? Title Cording in L UE decrease for pt to show WNL L UE AROM to return to cross fit or ADL's/IADL"S   ? Status Achieved   ?  ? OT LONG TERM GOAL #3  ? Status Achieved   ?  ? OT LONG TERM GOAL #4  ? Title Monitor need for compression for L upper quadrant to decrease lymphedema or  circumference in L UE   ? Baseline somw sweling post work out on chest wall and upper arm increase by 1.5 cm at upper arm was new in Jan but did not return - will reassess next visit arm again - but fitted ye

## 2021-08-09 ENCOUNTER — Ambulatory Visit: Payer: BC Managed Care – PPO | Admitting: Occupational Therapy

## 2021-08-09 DIAGNOSIS — M79602 Pain in left arm: Secondary | ICD-10-CM

## 2021-08-09 DIAGNOSIS — L905 Scar conditions and fibrosis of skin: Secondary | ICD-10-CM | POA: Diagnosis not present

## 2021-08-09 DIAGNOSIS — M62838 Other muscle spasm: Secondary | ICD-10-CM | POA: Diagnosis not present

## 2021-08-09 DIAGNOSIS — M6281 Muscle weakness (generalized): Secondary | ICD-10-CM | POA: Diagnosis not present

## 2021-08-09 DIAGNOSIS — M25612 Stiffness of left shoulder, not elsewhere classified: Secondary | ICD-10-CM

## 2021-08-09 DIAGNOSIS — M25512 Pain in left shoulder: Secondary | ICD-10-CM | POA: Diagnosis not present

## 2021-08-09 NOTE — Therapy (Signed)
[facility] ?[address] ?[place], [place], [address] ?Phone: [phone]   Fax:  [phone] ? ?Occupational Therapy Treatment ? ?Patient Details  ?Name: Megan Cummings ?MRN: [id number] ?Date of Birth: 1976-02-13 ?Referring Provider (OT): Dr Windell Moment ? ? ?Encounter Date: 08/09/2021 ? ? OT End of Session - 08/09/21 1030   ? ? Visit Number 10   ? Number of Visits 13   ? Date for OT Re-Evaluation 08/31/21   ? OT Start Time 0820   ? OT Stop Time [phone]   ? OT Time Calculation (min) 31 min   ? Activity Tolerance Patient tolerated treatment well   ? Behavior During Therapy [facility] for tasks assessed/performed   ? ?  ?  ? ?  ? ? ?Past Medical History:  ?Diagnosis Date  ? Basal cell carcinoma 09/21/2015  ? Left anterior shoulder. Superficial and nodular patterns.  ? Basal cell carcinoma 09/28/2015  ? Left spinal upper back. Superficial.  ? Basal cell carcinoma 12/06/2016  ? Right forearm. Superficial.   ? Breast cancer ([place])   ? Left  ? Complication of anesthesia   ? woke up during colonoscopy  ? Dysplastic nevus 12/06/2016  ? Right spinal mid back. Severe atypia and halo nevus effect, margin involved. Excised: 02/13/2017. Margins free  ? Dysplastic nevus 07/04/2017  ? Right upper back. Moderate atypia, close to margin.  ? Family history of colon cancer   ? Family history of lung cancer   ? Fibrocystic breast   ? History of kidney stones   ? h/o  ? Personal history of radiation therapy   ? Stress incontinence   ? Umbilical hernia 97/0263  ? ? ?Past Surgical History:  ?Procedure Laterality Date  ? BREAST BIOPSY Left 01/13/2021  ? [place] bx/ ribbon clip/dcis grade 1  ? BREAST LUMPECTOMY Left 01/25/2021  ? COLONOSCOPY N/A 11/18/2020  ? Procedure: COLONOSCOPY;  Surgeon: Virgel Manifold, MD;  Location: [facility] ENDOSCOPY;  Service: Endoscopy;  Laterality: N/A;  ? FOOT SURGERY Right 07/04/2010  ? PART MASTECTOMY,RADIO FREQUENCY LOCALIZER,AXILLARY SENTINEL NODE BIOPSY  Left 01/25/2021  ? Procedure: PART MASTECTOMY,RADIO FREQUENCY LOCALIZER,AXILLARY SENTINEL NODE BIOPSY;  Surgeon: Herbert Pun, MD;  Location: [facility] ORS;  Service: General;  Laterality: Left;  ? ? ?There were no vitals filed for this visit. ? ? Subjective Assessment - 08/09/21 1029   ? ? Subjective  I did see the massage therapist and she worked a lot on my upper back and shoulder it feels looser I can reach overhead now.  But the scar is still tight I did not do scar massage I should have.  The massage therapist recommended red  light therapy- so I made appointment for me for this week so I will see with that about.   ? Pertinent History Megan Cummings is a 46 y.o.female patient who comes for follow up her left breast mastitis.    Patient with left mastitis with seroma. We have been doing serial aspirations with slowly improvement. Today there is no significant fluid to aspirate. The erythema is also improving. The pain is also improving. She still has significant stiffness and pain on the lateral aspect of the left breast that extends to the left axillary area  - last visit 07/08/21 and follow up again surgeon tomorrow   ? Patient Stated Goals I want my motion and pain better so I can do cross fit   ? Currently in Pain? Yes   ? Pain Score --  6 scar massage  ? Pain Location Breast   ? Pain Orientation Left   ? Pain Descriptors / Indicators Tightness;Tender   ? Pain Type Surgical pain   ? ?  ?  ? ?  ? ? ? ?  Patient arrived today after being seen by her massage therapist since last session.  Massage therapist focused on upper back scapula shoulder and thoracic.  Patient shoulder flexion increased to nearly normal limits as well as shoulder abduction with mostly a pull in pec and anterior shoulder and a little lateral thoracic. ?In supine patient still had with shoulder abduction past 90 degrees pull at pec on anterior shoulder. ?But much improvement in shoulder range of motion with less pain. ?Patient with  less tenderness over lateral breast. ?Patient reports not being able to do as much scar massage as she would have liked.  Still tender 2-6/10 mostly with scar massage from 5:00 up words. ?Reviewed again with patient scar mobilization for her or her husband to do morning and evening 2 to 3 minutes.  Provided her with Cica -Care scar pad to use at nighttime ?Did scar mobilization by OT manually as well as mini massager.  Patient do not feel any more a pull at the scar with shoulder flexion and extension.  Patient reports that massage therapist recommended her to maybe have red light therapy patient scheduled this week at the clinic to do that as well as see massage therapist again. ?Reviewed with patient to focus on stretches more than strengthening overhead at this time.  Done prayer stretch patient tolerating well stop at light stretch. ?Also showed her shoulder flexion and abduction that she can do at her desk in chair to work on flexibility. ?Also told her to check in with her chiropractor about if they do any laser therapy and if that will be indicated for her scar.  Patient to follow-up with me in a week. ? ? ? ? ? ? ? ? ? ? ? ? ? ? ? ? ? ? ? OT Education - 08/09/21 1030   ? ? Education Details changes in HEP , soft tissue, ROM and scar tissue massage   ? Person(s) Educated Patient   ? Methods Explanation;Demonstration;Tactile cues;Verbal cues;Handout   ? Comprehension Returned demonstration;Verbal cues required   ? ?  ?  ? ?  ? ? ? OT Short Term Goals - 07/20/21 1015   ? ?  ? OT SHORT TERM GOAL #1  ? Title Patient to be independent in the home program doing scar tissue massage, compression for left breast to decrease pain less than 2/10 with range of motion touch   ? Baseline Unable to tolerate any soft tissue or palpation on L lateral breast superior breast into axilla.  5-7/10 pain with abduction of L shoulder to 90 and scar massage or soft tissue on L breast and pec   ? Time 4   ? Period Weeks   ? Status New    ? Target Date 08/17/21   ? ?  ?  ? ?  ? ? ? ? OT Long Term Goals - 07/20/21 1011   ? ?  ? OT LONG TERM GOAL #1  ? Title Pain in L arm decrease to less than 3/10 to do pull over shirt and do hair   ? Status Achieved   ?  ? OT LONG TERM GOAL #2  ? Title Cording in L UE decrease for pt to show WNL L UE AROM to  return to cross fit or ADL's/IADL"S   ? Status Achieved   ?  ? OT LONG TERM GOAL #3  ? Status Achieved   ?  ? OT LONG TERM GOAL #4  ? Title Monitor need for compression for L upper quadrant to decrease lymphedema or circumference in L UE   ? Baseline somw sweling post work out on chest wall and upper arm increase by 1.5 cm at upper arm was new in Jan but did not return - will reassess next visit arm again - but fitted yesterday with jovipak on L breast , chip bag today for scar tissue and fibrosis -   ? Time 6   ? Status On-going   ? Target Date 08/31/21   ?  ? OT LONG TERM GOAL #5  ? Title Left shoulder active range of motion increased to within normal limits with pain less than a 2 and left breast   ? Baseline Left shoulder flexion 145, abduction 85 pain 5-7/10 in left pec, superior breast and down into scar   ? Time 6   ? Period Weeks   ? Status New   ? Target Date 08/31/21   ? ?  ?  ? ?  ? ? ? ? ? ? ? ? Plan - 08/09/21 1031   ? ? Clinical Impression Statement Pt present at OT eval  end of last year with diagnosis L partial mastectomy 01/25/21 - Pt had cording in L UE and pain with AROM  improved greastly  Pt was seen in January with radiation nearly done , had great ROM and was back to do some of her exercieses with ligth crossfit and was going to start yoga. Had to hold off on manual and chipbag because of skin irriation from radiation. Pt  did not return at that time after 3-4 wks as recommended. Pt returned 3 wks ago  after onset of cellulitis/seroma/mastitiis in L breast end of March- had some aspiration as well as 2 rounds of antibiotics.  The last few sessions patient could not tolerate any soft  tissue massage on lateral breast last time and most of tightness was over packed area superior breast.  Reviewed with patient last time scar mobs for her to do as well as husband to assist if needed.  Referred

## 2021-08-16 ENCOUNTER — Ambulatory Visit: Payer: BC Managed Care – PPO | Admitting: Occupational Therapy

## 2021-08-16 DIAGNOSIS — M62838 Other muscle spasm: Secondary | ICD-10-CM | POA: Diagnosis not present

## 2021-08-16 DIAGNOSIS — L905 Scar conditions and fibrosis of skin: Secondary | ICD-10-CM | POA: Diagnosis not present

## 2021-08-16 DIAGNOSIS — M25612 Stiffness of left shoulder, not elsewhere classified: Secondary | ICD-10-CM | POA: Diagnosis not present

## 2021-08-16 DIAGNOSIS — M79602 Pain in left arm: Secondary | ICD-10-CM

## 2021-08-16 DIAGNOSIS — M6281 Muscle weakness (generalized): Secondary | ICD-10-CM | POA: Diagnosis not present

## 2021-08-16 DIAGNOSIS — M25512 Pain in left shoulder: Secondary | ICD-10-CM | POA: Diagnosis not present

## 2021-08-16 NOTE — Therapy (Signed)
[facility] ?[address] ?[place], [place], [address] ?Phone: [phone]   Fax:  [phone] ? ?Occupational Therapy Treatment ? ?Patient Details  ?Name: Megan Cummings ?MRN: [id number] ?Date of Birth: [birth year] ?Referring Provider (OT): Dr Windell Moment ? ? ?Encounter Date: 08/16/2021 ? ? OT End of Session - 08/16/21 1055   ? ? Visit Number 11   ? Number of Visits 13   ? Date for OT Re-Evaluation 08/31/21   ? OT Start Time 0900   ? OT Stop Time 0931   ? OT Time Calculation (min) 31 min   ? Activity Tolerance Patient tolerated treatment well   ? Behavior During Therapy [facility] for tasks assessed/performed   ? ?  ?  ? ?  ? ? ?Past Medical History:  ?Diagnosis Date  ? Basal cell carcinoma 09/21/2015  ? Left anterior shoulder. Superficial and nodular patterns.  ? Basal cell carcinoma 09/28/2015  ? Left spinal upper back. Superficial.  ? Basal cell carcinoma 12/06/2016  ? Right forearm. Superficial.   ? Breast cancer ([place])   ? Left  ? Complication of anesthesia   ? woke up during colonoscopy  ? Dysplastic nevus 12/06/2016  ? Right spinal mid back. Severe atypia and halo nevus effect, margin involved. Excised: 02/13/2017. Margins free  ? Dysplastic nevus 07/04/2017  ? Right upper back. Moderate atypia, close to margin.  ? Family history of colon cancer   ? Family history of lung cancer   ? Fibrocystic breast   ? History of kidney stones   ? h/o  ? Personal history of radiation therapy   ? Stress incontinence   ? Umbilical hernia 69/6295  ? ? ?Past Surgical History:  ?Procedure Laterality Date  ? BREAST BIOPSY Left 01/13/2021  ? [place] bx/ ribbon clip/dcis grade 1  ? BREAST LUMPECTOMY Left 01/25/2021  ? COLONOSCOPY N/A 11/18/2020  ? Procedure: COLONOSCOPY;  Surgeon: Virgel Manifold, MD;  Location: [facility] ENDOSCOPY;  Service: Endoscopy;  Laterality: N/A;  ? FOOT SURGERY Right 07/04/2010  ? PART MASTECTOMY,RADIO FREQUENCY LOCALIZER,AXILLARY SENTINEL NODE BIOPSY  Left 01/25/2021  ? Procedure: PART MASTECTOMY,RADIO FREQUENCY LOCALIZER,AXILLARY SENTINEL NODE BIOPSY;  Surgeon: Herbert Pun, MD;  Location: [facility] ORS;  Service: General;  Laterality: Left;  ? ? ?There were no vitals filed for this visit. ? ? Subjective Assessment - 08/16/21 1053   ? ? Subjective  I did see the massage therapist twice since have seen you.  It helping me loosening up on the back, lats and pect.  I did do some crusted on Friday and did 10 lbs overhead that I should have not maybe, because of more tight and sore today.  I am supposed to see her again today for massaging and then had 4 sessions of the red light therapy that she recommended.   ? Pertinent History Ms. Heinle is a 46 y.o.female patient who comes for follow up her left breast mastitis.    Patient with left mastitis with seroma. We have been doing serial aspirations with slowly improvement. Today there is no significant fluid to aspirate. The erythema is also improving. The pain is also improving. She still has significant stiffness and pain on the lateral aspect of the left breast that extends to the left axillary area  - last visit 07/08/21 and follow up again surgeon tomorrow   ? Patient Stated Goals I want my motion and pain better so I can do cross fit   ? Currently in  Pain? --   Soft tissue to scar - had pain no number given  ? ?  ?  ? ?  ? ? ? ? ?Patient arrived today after being seen by her massage therapist  again couple times  Massage therapist focused on upper back, scapula shoulder and thoracic.  Patient shoulder flexion increased to nearly normal limits as well as shoulder abduction with mostly a pull in pec and anterior shoulder and a little lateral thoracic.  Patient reports she did some CrossFit training with 10 pound weights on Friday that cause a little bit more soreness. ?Patient reports she did 4 sessions of red light therapy at [facility] what her massage therapist recommended.   ?Patient report her husband helps  her with the scar massage and she did the mini massager 2. ?Reviewed again with patient scar mobilization for her or her husband to do morning and evening 2 to 3 minutes.  Making sure to do scar mobilization for scar adhesion to loosen up.  Provided her with Cica -Care scar pad to use at nighttime again this date.  Patient reports that she really could see results using Cica -Care scar pad less hypertrophic scarring.  Info provided for patient where to order it to ?Did scar mobilization by OT manually as well as mini massager.   ?Patient do not feel a pull at the scar with shoulder flexion and abduction.   ?Reviewed with patient to focus on stretches more than strengthening overhead at this time.  Done child's pose stretch patient tolerating well stop at light stretch. ?Also showed her shoulder flexion and abduction that she can do at her desk in chair or using a ball to work on flexibility. ?  Patient to follow-up with me in a week. ?  ?  ? ? ? ? ? ? ? ? ? ? ? ? ? ? ? ? ? ? OT Education - 08/16/21 1055   ? ? Education Details changes in HEP , soft tissue, ROM and scar tissue massage   ? Person(s) Educated Patient   ? Methods Explanation;Demonstration;Tactile cues;Verbal cues;Handout   ? Comprehension Returned demonstration;Verbal cues required   ? ?  ?  ? ?  ? ? ? OT Short Term Goals - 07/20/21 1015   ? ?  ? OT SHORT TERM GOAL #1  ? Title Patient to be independent in the home program doing scar tissue massage, compression for left breast to decrease pain less than 2/10 with range of motion touch   ? Baseline Unable to tolerate any soft tissue or palpation on L lateral breast superior breast into axilla.  5-7/10 pain with abduction of L shoulder to 90 and scar massage or soft tissue on L breast and pec   ? Time 4   ? Period Weeks   ? Status New   ? Target Date 08/17/21   ? ?  ?  ? ?  ? ? ? ? OT Long Term Goals - 07/20/21 1011   ? ?  ? OT LONG TERM GOAL #1  ? Title Pain in L arm decrease to less than 3/10 to do pull  over shirt and do hair   ? Status Achieved   ?  ? OT LONG TERM GOAL #2  ? Title Cording in L UE decrease for pt to show WNL L UE AROM to return to cross fit or ADL's/IADL"S   ? Status Achieved   ?  ? OT LONG TERM GOAL #3  ? Status Achieved   ?  ?  OT LONG TERM GOAL #4  ? Title Monitor need for compression for L upper quadrant to decrease lymphedema or circumference in L UE   ? Baseline somw sweling post work out on chest wall and upper arm increase by 1.5 cm at upper arm was new in Jan but did not return - will reassess next visit arm again - but fitted yesterday with jovipak on L breast , chip bag today for scar tissue and fibrosis -   ? Time 6   ? Status On-going   ? Target Date 08/31/21   ?  ? OT LONG TERM GOAL #5  ? Title Left shoulder active range of motion increased to within normal limits with pain less than a 2 and left breast   ? Baseline Left shoulder flexion 145, abduction 85 pain 5-7/10 in left pec, superior breast and down into scar   ? Time 6   ? Period Weeks   ? Status New   ? Target Date 08/31/21   ? ?  ?  ? ?  ? ? ? ? ? ? ? ? Plan - 08/16/21 1056   ? ? Clinical Impression Statement Pt present at OT eval  end of last year with diagnosis L partial mastectomy 01/25/21 - Pt had cording in L UE and pain with AROM  improved greastly  Pt was seen in January with radiation nearly done , had great ROM and was back to do some of her exercieses with ligth crossfit and was going to start yoga. Had to hold off on manual and chipbag because of skin irriation from radiation. Pt  did not return at that time after 3-4 wks as recommended. Pt returned 3 wks ago  after onset of cellulitis/seroma/mastitiis in L breast end of March- had some aspiration as well as 2 rounds of antibiotics.  The last few sessions patient could not tolerate any soft tissue massage on lateral breast last time and most of tightness was over packed area superior breast.  Since last week patient showed increase active range of motion for left  shoulder flexion and abduction.  After she has been seeing the massage therapist for a few sessions and reports she also had a red light therapy about 4 sessions.  Patient was a little bit more tight and

## 2021-08-23 ENCOUNTER — Ambulatory Visit: Payer: BC Managed Care – PPO | Admitting: Occupational Therapy

## 2021-08-23 ENCOUNTER — Other Ambulatory Visit: Payer: Self-pay | Admitting: General Surgery

## 2021-08-23 DIAGNOSIS — M62838 Other muscle spasm: Secondary | ICD-10-CM | POA: Diagnosis not present

## 2021-08-23 DIAGNOSIS — M79602 Pain in left arm: Secondary | ICD-10-CM

## 2021-08-23 DIAGNOSIS — L905 Scar conditions and fibrosis of skin: Secondary | ICD-10-CM

## 2021-08-23 DIAGNOSIS — M6281 Muscle weakness (generalized): Secondary | ICD-10-CM | POA: Diagnosis not present

## 2021-08-23 DIAGNOSIS — M25512 Pain in left shoulder: Secondary | ICD-10-CM

## 2021-08-23 DIAGNOSIS — M25612 Stiffness of left shoulder, not elsewhere classified: Secondary | ICD-10-CM

## 2021-08-23 NOTE — Therapy (Signed)
[place] [facility] [address] [place], [address] Phone: [phone]   Fax:  [phone]  Occupational Therapy Treatment  Patient Details  Name: Megan Cummings MRN: [id number] Date of Birth: [birth year] Referring Provider (OT): Dr Windell Moment   Encounter Date: 08/23/2021   OT End of Session - 08/23/21 1335     Visit Number 12    Number of Visits 13    Date for OT Re-Evaluation 08/31/21    OT Start Time 0946    OT Stop Time 1029    OT Time Calculation (min) 43 min    Activity Tolerance Patient tolerated treatment well    Behavior During Therapy [facility] for tasks assessed/performed             Past Medical History:  Diagnosis Date   Basal cell carcinoma 09/21/2015   Left anterior shoulder. Superficial and nodular patterns.   Basal cell carcinoma 09/28/2015   Left spinal upper back. Superficial.   Basal cell carcinoma 12/06/2016   Right forearm. Superficial.    Breast cancer ([place])    Left   Complication of anesthesia    woke up during colonoscopy   Dysplastic nevus 12/06/2016   Right spinal mid back. Severe atypia and halo nevus effect, margin involved. Excised: 02/13/2017. Margins free   Dysplastic nevus 07/04/2017   Right upper back. Moderate atypia, close to margin.   Family history of colon cancer    Family history of lung cancer    Fibrocystic breast    History of kidney stones    h/o   Personal history of radiation therapy    Stress incontinence    Umbilical hernia 63/7858    Past Surgical History:  Procedure Laterality Date   BREAST BIOPSY Left 01/13/2021   [place] bx/ ribbon clip/dcis grade 1   BREAST LUMPECTOMY Left 01/25/2021   COLONOSCOPY N/A 11/18/2020   Procedure: COLONOSCOPY;  Surgeon: Virgel Manifold, MD;  Location: [facility] ENDOSCOPY;  Service: Endoscopy;  Laterality: N/A;   FOOT SURGERY Right 07/04/2010   PART MASTECTOMY,RADIO FREQUENCY LOCALIZER,AXILLARY SENTINEL NODE BIOPSY  Left 01/25/2021   Procedure: PART MASTECTOMY,RADIO FREQUENCY LOCALIZER,AXILLARY SENTINEL NODE BIOPSY;  Surgeon: Herbert Pun, MD;  Location: [facility] ORS;  Service: General;  Laterality: Left;    There were no vitals filed for this visit.   Subjective Assessment - 08/23/21 1333     Subjective  The massage therapy help I feel it for 2 days afterwards better.  To tell you the honesty the red light therapy the I do not think is helping I had 4 sessions I think I need like 24 I think.  I am just going to use the massage therapist recommended it.  I feel like about the same tightness over my anterior breast and then down my side when I reached overhead.  And then I feel this click at times now since I had this setback and March.    Pertinent History Megan Cummings is a 46 y.o.female patient who comes for follow up her left breast mastitis.    Patient with left mastitis with seroma. We have been doing serial aspirations with slowly improvement. Today there is no significant fluid to aspirate. The erythema is also improving. The pain is also improving. She still has significant stiffness and pain on the lateral aspect of the left breast that extends to the left axillary area  - last visit 07/08/21 and follow up again surgeon tomorrow    Patient Stated Goals  I want my motion and pain better so I can do cross fit    Currently in Pain? Yes   scar mobs - with stretch anterior pect and lats   Pain Score 6     Pain Location Breast    Pain Orientation Left    Pain Descriptors / Indicators Tender;Tightness    Pain Type Surgical pain    Pain Onset More than a month ago    Aggravating Factors  scar mobs                 Patient arrived today after being seen by her massage therapist few times with great success feeling about 2 days after massaging better with her range of motion.  Massage therapist focused on upper back, scapula shoulder and thoracic.  Patient shoulder flexion increased to nearly  normal limits as well as shoulder abduction with mostly a pull in superior breast and lateral thoracic towards lats and serratus.   Patient reports she did hold off on CrossFit weight training overhead since last week.   Patient reports she did 4 sessions of red light therapy at [facility] what her massage therapist recommended but she feels like its not helping, she thinks she has slight 24 sessions.   Patient report her husband helps her with the scar massage and she did the mini massager but does not do it as good as I do over here in the clinic.  Reviewed again with patient scar mobilization for her or her husband to do morning and evening 2 to 3 minutes.  This date done massage with a patient overhead with her arm and using mini massager over Cica -Care scar pad.  Patient ScarSil it here and painful 5-6/10.   Provided her with Cica -Care scar pad to use at nighttime again this date as well as want to do scar massage over.  Patient reports that she really could see results using Cica -Care scar pad less hypertrophic scarring.  Info provided for patient where to order it to Did scar mobilization by OT manually as well as mini massager.   Scar tissue adhesions did improve with patient not feeling a pull the last few sessions into the scar but more over the superior breast where radiation changes was.  As well as on lateral thoracic and at scapula.   Did with patient's side-lying scapular mobilization prior to range of motion using golf club for D1-D2 patterns shoulder flexion as well as shoulder abduction.   Done in supine scapular protraction retraction and add for patient 2 pound weight now to do 2 sets of 10.   If she do not feel a pull in the D1-D2 pattern in supine she can use a 1 pound weight.   Add and upgraded her fourth to red Thera-Band for shoulder extension that she was already doing with a 3 pound weighted CrossFit per patient.  Add scapular retraction 2 sets of 10 with a red Thera-Band.     Since this last episode of seroma with increased scar tissue and radiation changes.  With patient coming out with severe stiffness making progress with range of motion scar tissue soft tissue patient do report a clicking in her shoulder with overhead. As well as at times with pain in shoulder with overhead.  Would recommend at this time PT evaluation for rotator cuff strengthening.               OT Education - 08/23/21 1335     Education  Details changes in HEP , soft tissue, ROM and scar tissue massage    Person(s) Educated Patient    Methods Explanation;Demonstration;Tactile cues;Verbal cues;Handout    Comprehension Returned demonstration;Verbal cues required              OT Short Term Goals - 07/20/21 1015       OT SHORT TERM GOAL #1   Title Patient to be independent in the home program doing scar tissue massage, compression for left breast to decrease pain less than 2/10 with range of motion touch    Baseline Unable to tolerate any soft tissue or palpation on L lateral breast superior breast into axilla.  5-7/10 pain with abduction of L shoulder to 90 and scar massage or soft tissue on L breast and pec    Time 4    Period Weeks    Status New    Target Date 08/17/21               OT Long Term Goals - 07/20/21 1011       OT LONG TERM GOAL #1   Title Pain in L arm decrease to less than 3/10 to do pull over shirt and do hair    Status Achieved      OT LONG TERM GOAL #2   Title Cording in L UE decrease for pt to show WNL L UE AROM to return to cross fit or ADL's/IADL"S    Status Achieved      OT LONG TERM GOAL #3   Status Achieved      OT LONG TERM GOAL #4   Title Monitor need for compression for L upper quadrant to decrease lymphedema or circumference in L UE    Baseline somw sweling post work out on chest wall and upper arm increase by 1.5 cm at upper arm was new in Jan but did not return - will reassess next visit arm again - but fitted yesterday  with jovipak on L breast , chip bag today for scar tissue and fibrosis -    Time 6    Status On-going    Target Date 08/31/21      OT LONG TERM GOAL #5   Title Left shoulder active range of motion increased to within normal limits with pain less than a 2 and left breast    Baseline Left shoulder flexion 145, abduction 85 pain 5-7/10 in left pec, superior breast and down into scar    Time 6    Period Weeks    Status New    Target Date 08/31/21                   Plan - 08/23/21 1335     Clinical Impression Statement Pt present at OT eval  end of last year with diagnosis L partial mastectomy 01/25/21 - Pt had cording in L UE and pain with AROM  improved greastly  Pt was seen in January with radiation nearly done , had great ROM and was back to do some of her exercieses with ligth crossfit and was going to start yoga. Had to hold off on manual and chipbag because of skin irriation from radiation. Pt  did not return at that time after 3-4 wks as recommended. Pt returned 4 wks ago  after onset of cellulitis/seroma/mastitiis in L breast end of March- had some aspiration as well as 2 rounds of antibiotics.  At reevaluation 4 weeks ago patient had increased pain in breast and axilla  with range of motion less than 90 degrees of abduction of flexion.  Patient tender over scar tissue and was referred to 3 weeks ago to massage therapy to work on upper thoracic back and lats.  Since last week patient showing increased active range of motion and decreased pain.  Patient flexion and abduction increased about 160 degrees and less pain or pull into the scar tissue.  Patient continues to have some discomfort for over head reaching with a pul and superior breast as well as serratus and lats.  Done the state scar massage over Cica -Care scar pad manually and reinforced again for patient importance of scar massage with arm overhead.  Patient do report a clicking in her shoulder since she had the seroma and as  she is gaining range of motion.  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  At this date with patient active assisted range of motion for D1-D2 pattern in supine using golf club.  With a 1 pound weight and to 2 pound weight for supine protraction retraction of left shoulder scapul.  Patient can also do red Thera-Band for shoulder extension and scapula retraction.  Do recommend at this time referral to PT for rotator cuff issues and pain with overhead exercises.  Pt to cont with  Jovipak breast pad to wear and chip bag for fibrosis - alternating. Pt cont to be limited by increase pain at  scar adhesion tissue , fibrosis and stiffness /decrease AROM L shoulder with ADL's and IADl's. Pt can cont to benefit from OT services.    OT Occupational Profile and History Problem Focused Assessment - Including review of records relating to presenting problem    Occupational performance deficits (Please refer to evaluation for details): ADL's;IADL's;Play;Leisure;Social Participation    Body Structure / Function / Physical Skills ADL;Decreased knowledge of precautions;Flexibility;Scar mobility;ROM;Edema;Pain    Rehab Potential Good    Clinical Decision Making Limited treatment options, no task modification necessary    Comorbidities Affecting Occupational Performance: None    Modification or Assistance to Complete Evaluation  No modification of tasks or assist necessary to complete eval    OT Frequency 1x / week    OT Duration 6 weeks    OT Treatment/Interventions Self-care/ADL training;Manual lymph drainage;Contrast Bath;Cryotherapy;Manual Therapy;Patient/family education;Passive range of motion;Therapeutic exercise;Scar mobilization    Consulted and Agree with Plan of Care Patient             Patient will benefit from skilled therapeutic intervention in order to improve the following deficits and impairments:   Body  Structure / Function / Physical Skills: ADL, Decreased knowledge of precautions, Flexibility, Scar mobility, ROM, Edema, Pain       Visit Diagnosis: Pain in left arm  Stiffness of left shoulder, not elsewhere classified  Scar tissue    Problem List Patient Active Problem List   Diagnosis Date Noted   Encounter for hepatitis C screening test for low risk patient 03/25/2021   Annual physical exam 03/25/2021   Encounter for lipid screening for cardiovascular disease 03/25/2021   Stress and adjustment reaction 03/25/2021   Dizziness, nonspecific 03/25/2021   Malignant neoplasm of breast, stage 1, estrogen receptor positive, left ([place]) 01/22/2021    Rosalyn Gess, OTR/L,CLT 08/23/2021, 1:43 PM  [place] [facility] [address] [place], [address] Phone: [phone]   Fax:  [phone]  Name: Megan Cummings  MRN: [id number] Date of Birth: 22-May-1975

## 2021-08-24 ENCOUNTER — Encounter: Payer: Self-pay | Admitting: Oncology

## 2021-08-25 ENCOUNTER — Encounter: Payer: Self-pay | Admitting: Obstetrics and Gynecology

## 2021-08-25 ENCOUNTER — Ambulatory Visit (INDEPENDENT_AMBULATORY_CARE_PROVIDER_SITE_OTHER): Payer: BC Managed Care – PPO | Admitting: Obstetrics and Gynecology

## 2021-08-25 VITALS — BP 116/82 | Wt 159.4 lb

## 2021-08-25 DIAGNOSIS — N819 Female genital prolapse, unspecified: Secondary | ICD-10-CM | POA: Diagnosis not present

## 2021-08-25 NOTE — Progress Notes (Signed)
46 yo P1 with LMP 05/28/2021 here for the evaluation of vaginal discomfort. Patient reports noticing a lump in her vagina last week. She is recovering from breast cancer and is on hormonal therapy. She is physically active and participates in cross fit training. She reports noticing some vaginal discomfort last week and upon further investigation noted a mass in her vagina. She denies any pain. She is sexually active without complaints. She denies urinary incontinence or retention. She reports regular bowel movements. Patient also reports the intermittent presence of a hard nodules at vaginal introitus  Past Medical History:  Diagnosis Date   Basal cell carcinoma 09/21/2015   Left anterior shoulder. Superficial and nodular patterns.   Basal cell carcinoma 09/28/2015   Left spinal upper back. Superficial.   Basal cell carcinoma 12/06/2016   Right forearm. Superficial.    Breast cancer ([place])    Left   Complication of anesthesia    woke up during colonoscopy   Dysplastic nevus 12/06/2016   Right spinal mid back. Severe atypia and halo nevus effect, margin involved. Excised: 02/13/2017. Margins free   Dysplastic nevus 07/04/2017   Right upper back. Moderate atypia, close to margin.   Family history of colon cancer    Family history of lung cancer    Fibrocystic breast    History of kidney stones    h/o   Personal history of radiation therapy    Stress incontinence    Umbilical hernia 81/8299   Past Surgical History:  Procedure Laterality Date   BREAST BIOPSY Left 01/13/2021   [place] bx/ ribbon clip/dcis grade 1   BREAST LUMPECTOMY Left 01/25/2021   COLONOSCOPY N/A 11/18/2020   Procedure: COLONOSCOPY;  Surgeon: Virgel Manifold, MD;  Location: [facility] ENDOSCOPY;  Service: Endoscopy;  Laterality: N/A;   FOOT SURGERY Right 07/04/2010   PART MASTECTOMY,RADIO FREQUENCY LOCALIZER,AXILLARY SENTINEL NODE BIOPSY Left 01/25/2021   Procedure: PART MASTECTOMY,RADIO FREQUENCY LOCALIZER,AXILLARY  SENTINEL NODE BIOPSY;  Surgeon: Herbert Pun, MD;  Location: [facility] ORS;  Service: General;  Laterality: Left;   Family History  Problem Relation Age of Onset   Healthy Mother    Hyperlipidemia Father    Healthy Brother    Lung cancer Maternal Uncle    Healthy Maternal Grandmother    Emphysema Maternal Grandfather    Colon cancer Paternal Grandmother    Lung cancer Paternal Grandfather    Healthy Son    Breast cancer Neg Hx    Social History   Tobacco Use   Smoking status: Former    Packs/day: 0.50    Years: 10.00    Pack years: 5.00    Types: Cigarettes    Quit date: 2005    Years since quitting: 18.4   Smokeless tobacco: Never  Vaping Use   Vaping Use: Never used  Substance Use Topics   Alcohol use: Yes    Comment: Occasionally drinks wine   Drug use: No   ROS See pertinent in HPI. All other systems reviewed and non contributory Blood pressure 116/82, weight 159 lb 6.4 oz (72.3 kg), last menstrual period 05/28/2021. GENERAL: Well-developed, well-nourished female in no acute distress.  ABDOMEN: Soft, nontender, nondistended. No organomegaly. PELVIC: Normal external female genitalia. Vagina is pink and rugated.  Normal discharge. Normal appearing cervix. Uterus is normal in size. No adnexal mass or tenderness. Mild uterine prolapse which worsen with valsalva. Chaperone present during the pelvic exam EXTREMITIES: No cyanosis, clubbing, or edema, 2+ distal pulses.  A/P 46 yo with uterine prolapse - Patient  referred to urogynecology - Reassurance provided - Patient advised to take a picture when nodule appears as today's exam was normal - RTC prn

## 2021-08-26 ENCOUNTER — Inpatient Hospital Stay: Payer: BC Managed Care – PPO | Attending: Oncology

## 2021-08-26 DIAGNOSIS — Z170 Estrogen receptor positive status [ER+]: Secondary | ICD-10-CM | POA: Diagnosis not present

## 2021-08-26 DIAGNOSIS — Z5111 Encounter for antineoplastic chemotherapy: Secondary | ICD-10-CM | POA: Insufficient documentation

## 2021-08-26 DIAGNOSIS — C50912 Malignant neoplasm of unspecified site of left female breast: Secondary | ICD-10-CM | POA: Diagnosis not present

## 2021-08-26 MED ORDER — GOSERELIN ACETATE 3.6 MG ~~LOC~~ IMPL
3.6000 mg | DRUG_IMPLANT | SUBCUTANEOUS | Status: DC
  Administered 2021-08-26: 3.6 mg via SUBCUTANEOUS
  Filled 2021-08-26: qty 3.6

## 2021-08-31 ENCOUNTER — Ambulatory Visit: Payer: BC Managed Care – PPO | Admitting: Occupational Therapy

## 2021-08-31 ENCOUNTER — Encounter: Payer: Self-pay | Admitting: Physical Therapy

## 2021-08-31 ENCOUNTER — Ambulatory Visit: Payer: BC Managed Care – PPO | Admitting: Physical Therapy

## 2021-08-31 DIAGNOSIS — M25612 Stiffness of left shoulder, not elsewhere classified: Secondary | ICD-10-CM | POA: Diagnosis not present

## 2021-08-31 DIAGNOSIS — L905 Scar conditions and fibrosis of skin: Secondary | ICD-10-CM | POA: Diagnosis not present

## 2021-08-31 DIAGNOSIS — M6281 Muscle weakness (generalized): Secondary | ICD-10-CM | POA: Diagnosis not present

## 2021-08-31 DIAGNOSIS — M62838 Other muscle spasm: Secondary | ICD-10-CM | POA: Diagnosis not present

## 2021-08-31 DIAGNOSIS — M79602 Pain in left arm: Secondary | ICD-10-CM | POA: Diagnosis not present

## 2021-08-31 DIAGNOSIS — M25512 Pain in left shoulder: Secondary | ICD-10-CM | POA: Diagnosis not present

## 2021-08-31 NOTE — Therapy (Signed)
[facility] [address] [place], [address] Phone: [phone]   Fax:  [phone]  Occupational Therapy Treatment  Patient Details  Name: Megan Cummings MRN: [id number] Date of Birth: Dec 29, 1975 Referring Provider (OT): Dr Windell Moment   Encounter Date: 08/31/2021   OT End of Session - 08/31/21 1027     Visit Number 13    Number of Visits 18    Date for OT Re-Evaluation 11/23/21    OT Start Time 0820    OT Stop Time 0900    OT Time Calculation (min) 40 min    Activity Tolerance Patient tolerated treatment well    Behavior During Therapy [facility] for tasks assessed/performed             Past Medical History:  Diagnosis Date   Basal cell carcinoma 09/21/2015   Left anterior shoulder. Superficial and nodular patterns.   Basal cell carcinoma 09/28/2015   Left spinal upper back. Superficial.   Basal cell carcinoma 12/06/2016   Right forearm. Superficial.    Breast cancer ([place])    Left   Complication of anesthesia    woke up during colonoscopy   Dysplastic nevus 12/06/2016   Right spinal mid back. Severe atypia and halo nevus effect, margin involved. Excised: 02/13/2017. Margins free   Dysplastic nevus 07/04/2017   Right upper back. Moderate atypia, close to margin.   Family history of colon cancer    Family history of lung cancer    Fibrocystic breast    History of kidney stones    h/o   Personal history of radiation therapy    Stress incontinence    Umbilical hernia [id number]/2992    Past Surgical History:  Procedure Laterality Date   BREAST BIOPSY Left 01/13/2021   [place] bx/ ribbon clip/dcis grade 1   BREAST LUMPECTOMY Left 01/25/2021   COLONOSCOPY N/A 11/18/2020   Procedure: COLONOSCOPY;  Surgeon: Virgel Manifold, MD;  Location: [facility] ENDOSCOPY;  Service: Endoscopy;  Laterality: N/A;   FOOT SURGERY Right 07/04/2010   PART MASTECTOMY,RADIO FREQUENCY LOCALIZER,AXILLARY SENTINEL NODE BIOPSY  Left 01/25/2021   Procedure: PART MASTECTOMY,RADIO FREQUENCY LOCALIZER,AXILLARY SENTINEL NODE BIOPSY;  Surgeon: Herbert Pun, MD;  Location: [facility] ORS;  Service: General;  Laterality: Left;    There were no vitals filed for this visit.   Subjective Assessment - 08/31/21 1025     Subjective  My motion is doing better is worse in the morning and get better as the day goes.  I feel like I am getting to be able to do some scar mobilization over that scar pad in combination with stretching overhead.  I did to do a 5 pound weight overhead at [employer].  But on careful.  I do not feel like that red light therapy is helping but I am doing it.  Trying to see the massage therapist once a week.  I do feel a click still in right shoulder.    Pertinent History Ms. Kimbell is a 46 y.o.female patient who comes for follow up her left breast mastitis.    Patient with left mastitis with seroma. We have been doing serial aspirations with slowly improvement. Today there is no significant fluid to aspirate. The erythema is also improving. The pain is also improving. She still has significant stiffness and pain on the lateral aspect of the left breast that extends to the left axillary area  - last visit 07/08/21 and follow up again surgeon tomorrow  Patient Stated Goals I want my motion and pain better so I can do cross fit    Currently in Pain? Yes    Pain Score 9     Pain Location Breast    Pain Orientation Left    Pain Descriptors / Indicators Tender    Pain Type Surgical pain    Pain Onset More than a month ago    Aggravating Factors  Scar massage                 LYMPHEDEMA/ONCOLOGY QUESTIONNAIRE - 08/31/21 0001       Right Upper Extremity Lymphedema   15 cm Proximal to Olecranon Process 29.5 cm    10 cm Proximal to Olecranon Process 28.5 cm    Olecranon Process 25 cm    15 cm Proximal to Ulnar Styloid Process 22.5 cm      Left Upper Extremity Lymphedema   15 cm Proximal to Olecranon  Process 29.5 cm    10 cm Proximal to Olecranon Process 28.5 cm    Olecranon Process 25 cm    15 cm Proximal to Ulnar Styloid Process 22.5 cm               Patient arrives with reports of increased active range of motion during the day with stiffness mostly in the morning over superior left breast, axilla and scapula.  Patient feels active range of motion is within functional limits but still feel a clicking overhead. Able to tolerate her scar massage over the Cica -Care scar pad better and trying to do it during the day on and off.  With overhead motion not feeling a pull with the scar anymore but she do show scar adhesion and tenderness with OT doing scar mobilization 9/10 below nipple.    Patient continues to see once a week her massage therapist focusing on upper back, scapula, shoulder and thoracic.  Patient reports she did do 5 pounds overhead at [employer] since I seen her last time but not as many reps and sets. She also had a few sessions of red light therapy at [facility] what her massage therapist recommended but she feels like its not helping, she thinks she has  24 sessions.     Reviewed again with patient scar mobilization for her or her husband to do morning and evening 2 to 3 minutes.  This date done scar massage with left arm overhead and using mini massager over Cica -Care scar pad.  Scar continues to be adhere below nipple and painful  9/10 discomfort level or tenderness.   Provided her with Cica -Care scar pad to use at nighttime again this date as well as want to do scar massage over few times during the day if not use Kinesiotape.  Patient reports that she really could see results using Cica -Care scar pad less hypertrophic scarring.  Info provided for patient where to order it to Did educate patient and done Kinesiotape over scar index with 100% pull.  Education was done for precautions patient to wear during the day for scar mobilization with overhead and shoulder range of  motion.  Continue with Cica -Care scar pad time.   Scar tissue adhesions did improve with patient not feeling a pull the last few sessions into the scar with overhead range of motion    Since this last episode of seroma with increased scar tissue and radiation changes.  With patient coming out with severe stiffness making progress with range of motion ,scar  tissue soft tissue but patient do report a clicking in her shoulder with overhead. As well as at times pain in shoulder with overhead.  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 Peyton Najjar for evaluation rotator cuff strengthening.                   OT Education - 08/31/21 1027     Education Details changes in HEP , soft tissue, ROM and scar tissue massage    Person(s) Educated Patient    Methods Explanation;Demonstration;Tactile cues;Verbal cues;Handout    Comprehension Returned demonstration;Verbal cues required              OT Short Term Goals - 08/31/21 1035       OT SHORT TERM GOAL #1   Title Patient to be independent in the home program doing scar tissue massage, compression for left breast to decrease pain less than 2/10 with range of motion touch    Baseline Unable to tolerate any soft tissue or palpation on L lateral breast superior breast into axilla.  5-7/10 pain with abduction of L shoulder to 90 and scar massage or soft tissue on L breast and pec  NOW no pain wiht AROM for shoulder but pull over superior breast, axilla and scapula- scar tissue mobs still tender but no pain with ROM    Status Achieved               OT Long Term Goals - 08/31/21 1036       OT LONG TERM GOAL #1   Title Pain in L arm decrease to less than 3/10 to do pull over shirt and do hair    Baseline pain 8/10 in L arm at 80 degrees  - slight pull 1-2/10 and AROM WNL  NOW AROM WFL can pull shirt off pain free - but tight    Status Achieved      OT LONG TERM GOAL #2   Title Cording in L UE decrease for pt to show  WNL L UE AROM to return to cross fit or ADL's/IADL"S    Baseline Cording in axilla - pain 8/10 to 80 degrees to ABD and Flexion 108 degrees L UE- NOW AROM WNL slight pull but less than 1-2/10 - cannot do all crossfit exercises NOW refer to PT for over head strengthening -pt report click - but AROM WFL and pain free -pull or stretch    Status Achieved      OT LONG TERM GOAL #3   Title Pt to be independent in HEP to increase L UE AROM without increase symptoms    Baseline increase symptoms - pull over chest , axilla and scapula    Status Achieved      OT LONG TERM GOAL #4   Title Monitor need for compression for L upper quadrant to decrease lymphedema or circumference in L UE    Baseline Patient to have a preventative sleeve but did not need to wear it the last few weeks.  Measurements within normal limits compared to the right this date.  Continue to monitor as she increases her strengthening    Time 12    Period Weeks    Status On-going    Target Date 11/23/21      OT LONG TERM GOAL #5   Title Left shoulder active range of motion increased to within normal limits with pain less than a 2 and left breast    Baseline Active range of motion  within functional limits pain-free, but do feel or hear a click in shoulder for overhead strengthening no pull over scar tissue stiffness in the morning mostly over superior breast, axilla and scapula.  Patient referred to PT for shoulder strengthening.    Status Deferred                   Plan - 08/31/21 1029     Clinical Impression Statement Pt present at OT eval  end of last year with diagnosis L partial mastectomy 01/25/21 - Pt had cording in L UE and pain with AROM  improved greastly  Pt was seen in January with radiation nearly done , had great ROM and was back to do some of her exercieses with ligth crossfit and was going to start yoga. Had to hold off on manual and chipbag because of skin irriation from radiation. Pt  did not return at that  time after 3-4 wks as recommended. Pt returned 5 wks ago  after onset of cellulitis/seroma/mastitiis in L breast end of March- had some aspiration as well as 2 rounds of antibiotics.  At reevaluation 5 weeks ago patient had increased pain in breast and axilla with range of motion less than 90 degrees of abduction and flexion.  Patient cont to be tender with  scar massage and movs- showed increase ROM in shoulder as scar tissue releasing and after refered to massage therapy about 3-4 wks ago to adress upper thoracic back and lats. L shoulder AROM not WFL  and decreased pain but pt report she feels click with some of over head motion- and she is crossfit trainer - recommend for pt to see PT for little bit to increase risk for orthopedic issues in shoulder.  Pt to cont with  Jovipak breast pad to wear and chip bag for fibrosis as needed- tolerating scar massage at home over cica scar pad better- and pt to do scar mobs with arm over head. Did initiate kinesiotaping over scar this date for pt to use during day and cica scar pad at night time. Did review precautions.  Pt cont to be limited by increase scar adhesion tissue , fibrosis and stiffness in  AROM L shoulder with ADL's and IADl's. Pt can cont to benefit from cont  OT services.    OT Occupational Profile and History Problem Focused Assessment - Including review of records relating to presenting problem    Occupational performance deficits (Please refer to evaluation for details): ADL's;IADL's;Play;Leisure;Social Participation    Body Structure / Function / Physical Skills ADL;Decreased knowledge of precautions;Flexibility;Scar mobility;ROM;Edema;Pain    Rehab Potential Good    Clinical Decision Making Limited treatment options, no task modification necessary    Comorbidities Affecting Occupational Performance: None    Modification or Assistance to Complete Evaluation  No modification of tasks or assist necessary to complete eval    OT Frequency 1x / week     OT Duration 6 weeks    OT Treatment/Interventions Self-care/ADL training;Manual lymph drainage;Contrast Bath;Cryotherapy;Manual Therapy;Patient/family education;Passive range of motion;Therapeutic exercise;Scar mobilization    Consulted and Agree with Plan of Care Patient             Patient will benefit from skilled therapeutic intervention in order to improve the following deficits and impairments:   Body Structure / Function / Physical Skills: ADL, Decreased knowledge of precautions, Flexibility, Scar mobility, ROM, Edema, Pain       Visit Diagnosis: Stiffness of left shoulder, not elsewhere classified - Plan: Ot plan  of care cert/re-cert  Scar tissue - Plan: Ot plan of care cert/re-cert    Problem List Patient Active Problem List   Diagnosis Date Noted   Encounter for hepatitis C screening test for low risk patient 03/25/2021   Annual physical exam 03/25/2021   Encounter for lipid screening for cardiovascular disease 03/25/2021   Stress and adjustment reaction 03/25/2021   Dizziness, nonspecific 03/25/2021   Malignant neoplasm of breast, stage 1, estrogen receptor positive, left ([place]) 01/22/2021    Rosalyn Gess, OTR/L,CLT 08/31/2021, 12:43 PM  [facility] [facility] [address] [place], [address] Phone: [phone]   Fax:  [phone]  Name: Megan Cummings MRN: [id number] Date of Birth: March 03, 1976

## 2021-08-31 NOTE — Therapy (Signed)
OUTPATIENT PHYSICAL THERAPY EVALUATION   Patient Name: Megan Cummings MRN: [id number] DOB:1975/04/23, 46 y.o., female Today's Date: 08/31/2021   PT End of Session - 08/31/21 1742     Visit Number 1    Number of Visits 24    Date for PT Re-Evaluation 11/23/21    Authorization Type [employer] reporting period from 08/31/2021    Authorization Time Period VL 30 per cal yr    Authorization - Visit Number 1    Progress Note Due on Visit 10    PT Start Time 0950    PT Stop Time 1047    PT Time Calculation (min) 57 min    Activity Tolerance Patient tolerated treatment well;Patient limited by pain    Behavior During Therapy [facility] for tasks assessed/performed             Past Medical History:  Diagnosis Date   Basal cell carcinoma 09/21/2015   Left anterior shoulder. Superficial and nodular patterns.   Basal cell carcinoma 09/28/2015   Left spinal upper back. Superficial.   Basal cell carcinoma 12/06/2016   Right forearm. Superficial.    Breast cancer ([place])    Left   Complication of anesthesia    woke up during colonoscopy   Dysplastic nevus 12/06/2016   Right spinal mid back. Severe atypia and halo nevus effect, margin involved. Excised: 02/13/2017. Margins free   Dysplastic nevus 07/04/2017   Right upper back. Moderate atypia, close to margin.   Family history of colon cancer    Family history of lung cancer    Fibrocystic breast    History of kidney stones    h/o   Personal history of radiation therapy    Stress incontinence    Umbilical hernia 70/0174   Past Surgical History:  Procedure Laterality Date   BREAST BIOPSY Left 01/13/2021   [place] bx/ ribbon clip/dcis grade 1   BREAST LUMPECTOMY Left 01/25/2021   COLONOSCOPY N/A 11/18/2020   Procedure: COLONOSCOPY;  Surgeon: Virgel Manifold, MD;  Location: [facility] ENDOSCOPY;  Service: Endoscopy;  Laterality: N/A;   FOOT SURGERY Right 07/04/2010   PART MASTECTOMY,RADIO FREQUENCY LOCALIZER,AXILLARY SENTINEL  NODE BIOPSY Left 01/25/2021   Procedure: PART MASTECTOMY,RADIO FREQUENCY LOCALIZER,AXILLARY SENTINEL NODE BIOPSY;  Surgeon: Herbert Pun, MD;  Location: [facility] ORS;  Service: General;  Laterality: Left;   Patient Active Problem List   Diagnosis Date Noted   Encounter for hepatitis C screening test for low risk patient 03/25/2021   Annual physical exam 03/25/2021   Encounter for lipid screening for cardiovascular disease 03/25/2021   Stress and adjustment reaction 03/25/2021   Dizziness, nonspecific 03/25/2021   Malignant neoplasm of breast, stage 1, estrogen receptor positive, left ([place]) 01/22/2021    PCP: Herbert Pun, MD  REFERRING PROVIDER: Herbert Pun, MD  REFERRING DIAG: acute pain of left shoulder    THERAPY DIAG:  Left shoulder pain, unspecified chronicity  Stiffness of left shoulder, not elsewhere classified  Muscle weakness (generalized)  Other muscle spasm  Rationale for Evaluation and Treatment: Rehabilitation  ONSET DATE: L lumpectomy performed 01/25/2021   SUBJECTIVE:  SUBJECTIVE STATEMENT: Patient started having trouble with her left shoulder following treatment for left breast cancer (diagnosed in 01/2021): 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 Review of chart shows history of left breast biopsy 01/13/2021 and L breast lumpectomy 01/25/2021 with 3 sentinel lymph nodes biopsied and negative for malignancy. Patient completed adjuvant radiation treatment (completed early Jan 2023).  She started Arimidex 07/27/2021 and she is taking Zoladex monthly. Her mammogram in 2019 showed bilateral breast cysts that ultrasound showed were benign. Since her lumpectomy patient developed left breast mastitis with  left breast infected seroma. She underwent antibiotic therapy and serial aspirations with improvement.  She also had axillary cording following her lumpectomy. She has been seeing OT to help with soft tissue tension, cording and lymphedema risk since 02/15/2022. She was referred to PT because it appeared she would benefit from addressing orthopedic dysfunction at the shoulder girdle in addition to OT interventions.   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 She denies any pain with the clicking. The click feels inside the shoulder (points to the front) when she extends her shoulder). She has not had clicking in her shoulder before this time. She would like to have the clicking examined and hopefully resolved before she adds weight. She states she went back to strengthening in her workout a few days after she completed her radiation treatment and everything seemed to be going okay at first. She did front squats, power cleans, and rope climbs just to move without pain.The only thing that caused pain was any type of pull up including strict pull up and she felt restricted in her pec region initially. She was using a loaded bar and seemed fine with it. She could not do overhead lifts due to restricted ROM. She could demonstrate one rep with overhead movement using PVC pipe but that was it, and she could not demonstrate pull up at all. Patient states the scar tissue got inflamed after she did a L shoulder abduction wall stretch that felt good at the time but then flared up later that night.  She states that since the inflamed scar tissue improved her muscles feel restricted at the pec muscle and the back lat (whereas before it was only the lat).  She still has limited range of motion. Since the scar  tissue flair up she has been doing some banded stretches, light resistance training with the L UE, yoga stretches, yoga poses, and wall angels. She was surprised she could do the rope climb but not the pull up previously. She is right handed.   PERTINENT HISTORY: Patient is a 46 y.o. female who presents to outpatient physical therapy with a referral for medical diagnosis acute left shoulder pain. This patient's chief complaints consist of clicking, pain, stiffness, and dysfunction at left shoulder and upper quarter when using left UE leading to the following functional deficits: difficulty with activities that require use of L UE especially activities that require use of L UE overhead such as working out, coaching, crossfit, loading groceries, lifting, pushing, pulling, kayaking, hobbies, etc. 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  Patient denies hx of stroke, seizures, lung problems, heart problems, diabetes, unexplained weight loss, unexplained changes in bowel or bladder problems, unexplained stumbling or dropping things, osteoporosis, and spinal surgery or shoulder surgery.    PAIN:  Are you having pain? Yes: NPRS scale: Current: 0/10,  Best: 0/10, Worst: 7/10. Pain location: pulling at left chest axial region, and posterior lat/shoulder region.  Pain description: pulling Aggravating factors: pull up, overhead lifts and ROM in cross fit.  Relieving factors: avoiding overhead things.    FUNCTIONAL LIMITATIONS: difficulty with activities that require use of L UE especially activities that  require use of L UE overhead such as working out, coaching, crossfit, loading groceries, lifting, pushing, pulling, kayaking, hobbies, etc.   PRECAUTIONS: None  WEIGHT BEARING RESTRICTIONS No  FALLS:  Has patient fallen in last 6 months? No  LIVING ENVIRONMENT: No concerns about getting around home safely.   OCCUPATION: Finance/accounting full time, crossfit coach  PLOF: Independent  LEISURE: crossfit, working out, camping, hiking, biking, fishing, kayak  PATIENT GOALS: "resolve click before adding weight" "restore range of motion"  OBJECTIVE  DIAGNOSTIC FINDINGS:  No recent shoulder imaging  L breast US report from 07/27/2021: EXAM: DIGITAL DIAGNOSTIC UNILATERAL LEFT MAMMOGRAM WITH TOMOSYNTHESIS AND CAD; ULTRASOUND LEFT BREAST LIMITED   TECHNIQUE: Left digital diagnostic mammography and breast tomosynthesis was performed. The images were evaluated with computer-aided detection.; Targeted ultrasound examination of the left breast was performed.   COMPARISON:  Previous exam(s).   ACR Breast Density Category c: The breast tissue is heterogeneously dense, which may obscure small masses.   FINDINGS: Post operative changes are seen in the MEDIAL aspect of the RIGHTbreast. There is skin and trabecular thickening consistent with interval radiation treatment.   On physical exam, I palpate no mass in the LATERAL portion of the LEFT breast. The patient is tender on physical exam of the LATERAL portion of the breast. There is no erythema. There is a well-healed excision scar in the LOWER INNER QUADRANT the RIGHT breast in the LEFT axilla.   Targeted ultrasound is performed, showing normal appearing dense fibroglandular tissue in the LATERAL aspects of the LEFT breast in the areas of concern. There is no fluid collection in these regions.   IMPRESSION: No mammographic or ultrasound evidence for malignancy.   RECOMMENDATION: Bilateral diagnostic mammogram is recommended  in October 2023.   I have discussed the findings and recommendations with the patient. If applicable, a reminder letter will be sent to the patient regarding the next appointment.   BI-RADS CATEGORY  2: Benign.    OBSERVATION/INSPECTION Posture Posture (seated): grossly appears within functional limits Anthropometrics Tremor: none Body composition: WNL Functional Mobility Bed mobility: supine <> sit WNL Transfers: sit <> stand WNL Gait: grossly WFL for household and short community ambulation. More detailed gait analysis deferred to later date as needed.   PERIPHERAL JOINT MOTION (in degrees)  ACTIVE RANGE OF MOTION (AROM) *Indicates pain 08/31/21 Date Date  Joint/Motion R/L R/L R/L  Shoulder     Flexion 175/150* / /  Extension 58/60 / /  Abduction  180/149* / /  External rotation 100/95* / /  Internal rotation T5/T6 / /  Elbow     Flexion  5/5 / /  Extension  5/5 / /  Comments:  08/31/2021 L flexion: end range pain across chest  L extension: concordant click L abduction: pull at posterior rotator cuff region L ER: pulling across anterior deltoid/pec.  B elbows and wrist appear WNL.    PASSIVE RANGE OF MOTION (PROM) *Indicates pain 08/31/21 Date Date  Joint/Motion R/L R/L R/L  Shoulder     Flexion /160* / /  Extension / / /  Abduction  /165* / /  External rotation /70* / /  Internal rotation /55 / /  Comments:  08/31/2021: end range pain with pulling at various regions of the shoulder and periscapular, chest, and axillary region.   MUSCLE PERFORMANCE (MMT):  *Indicates pain 08/31/21 Date Date  Joint/Motion R/L R/L R/L  Shoulder     Flexion 5/5 / /  Abduction (C5) 5/4+ / /  External rotation 4/4 / /  Internal rotation 5/5 / /  Extension 5/4* / /  Elbow     Flexion (C6) 5/5 / /  Extension (C7) 5/5 / /  Comments:  08/31/2021: Shoulder ER: feels weaker on L compared to R to patient.  L shoulder extension: concordant pain (infected seroma) at left anterior  chest.   SPECIAL TESTS:  SHOULDER SPECIAL TESTS Painful arc test: R = negative, L = negative. Drop arm test: R = negative, L = negative. Hawkins-Kennedy test: R = negative, L = uncomfortable (unclear). Infraspinatus test: L = negative. Apprehension test:  L = negative. Relocation test:  L = positive for pain at anterior shoulder. Compression Rotation Test: L = positive for click and pain to pec minor. Jerk test: L = negative Anterior slide, posterior slide: L = negative Sulcus sign at 0 degrees: L = negative Lag tests: L = negative   ACCESSORY MOTION: Mild click reproduced at left [facility] joint with ER at 90 degrees abduction with axial compression.   PALPATION: Very tight and TTP at subscapularis and posterior rotator cuff.     TODAY'S TREATMENT:  Therapeutic exercise: to centralize symptoms and improve ROM, strength, muscular endurance, and activity tolerance required for successful completion of functional activities.  - kneeling B lat stretch with elbows on edge of plinth, holding PVC pipe in both hands rocking into maximal shoulder flexion and dropping chest towards floor with elbows flexed to at least 90 degrees. 5-30 second holds, 2x~ 5 reps (feels good pull but not pain).  - seated overhead pull using red theraband, 1x5 (easy), discussed use of stronger band and sitting under bar to attach it to to get more vertical force.  - Education on diagnosis, prognosis, POC, anatomy and physiology of current condition.  - standing B horizontal shoulder abduction with cuing for improved scapular motion with red theraband anchored in front.  - practiced "face pull" moving elbows forward to scaption position to decrease popping in left shoulder.  - Education on HEP including verbal description of exercises.   Pt required multimodal cuing for proper technique and to facilitate improved neuromuscular control, strength, range of motion, and functional ability resulting in improved performance  and form.  PATIENT EDUCATION: Education details: Exercise purpose/form. Self management techniques. Education on diagnosis, prognosis, POC, anatomy and physiology of current condition. Education on HEP  Person educated: Patient Education method: Explanation, Demonstration, Tactile cues, and Verbal cues Education comprehension: verbalized understanding, returned demonstration, verbal cues required, tactile cues required, and needs further education   HOME EXERCISE PROGRAM: - lat stretch - banded shoulder girdle exercises - to be refined at future visits.   ASSESSMENT:  CLINICAL IMPRESSION: Patient is a 46 y.o. female referred to outpatient physical  therapy with a medical diagnosis of acute pain of left shoulder  who presents with signs and symptoms consistent with left shoulder pain and dysfunction complicated by recent treatment for left breast cancer  with recurring seromas and infection of breast and surrounding tissue s/p radiation treatment. Patient presents with significant joint stiffness, ROM, muscle tension, pain, scar tissue dysfunction, tissue integrity,  motor control, muscle performance (strength/power/endurance), and activity tolerance impairments that are limiting ability to complete her usual activities that require use of L UE especially activities that require use of L UE overhead such as working out, coaching, crossfit, loading groceries, lifting, pushing, pulling, kayaking, hobbies, etc, without difficulty. Patient will benefit from skilled physical therapy intervention to address current body structure impairments and activity limitations to improve function and work towards goals set in current POC in order to return to prior level of function or maximal functional improvement.   OBJECTIVE IMPAIRMENTS decreased activity tolerance, decreased endurance, decreased knowledge of condition, decreased ROM, decreased strength, hypomobility, increased edema, increased fascial  restrictions, impaired perceived functional ability, increased muscle spasms, impaired flexibility, impaired tone, impaired UE functional use, improper body mechanics, and pain.   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   REHAB POTENTIAL: Good  CLINICAL DECISION MAKING: Evolving/moderate complexity  EVALUATION COMPLEXITY: Moderate   GOALS: Goals reviewed with patient? No  SHORT TERM GOALS: Target date: 09/14/2021  Patient will be independent with initial home exercise program for self-management of symptoms. Baseline: Initial HEP provided at IE (08/31/21); Goal status: INITIAL   LONG TERM GOALS: Target date: 11/23/2021  Patient will be independent with a long-term home exercise program for self-management of symptoms.  Baseline: Initial HEP provided at IE (08/31/21); Goal status: INITIAL  2.  Patient will demonstrate improved FOTO by equal or greater than 10 points to demonstrate improvement in overall condition and self-reported functional ability.  Baseline: to be measured visit 2 as  appropriate (08/31/21); Goal status: INITIAL  3.  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  Baseline: limited and painful - see objective exam (08/31/21); Goal status: INITIAL  4.  Patient will demonstrate L shoulder MMT of equal or greater than 5/5 in all motions without increased pain to improve her ability to participate in and coach [employer], carry groceries, and complete her IADLs.  Baseline: painful and weak - see objective exam (08/31/21); Goal status: INITIAL  5.  Patient will complete community, work and/or recreational activities without limitation due to current condition.  Baseline: difficulty with activities that require use of L UE especially  activities that require use of L UE overhead such as working out, coaching, crossfit, loading groceries, lifting, pushing, pulling, kayaking, hobbies, etc (08/31/21); Goal status: INITIAL  6.  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  Baseline: is unable to perform (08/31/2021);  Goal status: INITIAL    PLAN: PT FREQUENCY: 1-2x/week  PT DURATION: 12 weeks  PLANNED INTERVENTIONS: Therapeutic exercises, Therapeutic activity, Neuromuscular re-education, Gait training, Patient/Family education, Joint mobilization, Aquatic Therapy, Dry Needling, Spinal mobilization, Cryotherapy, Moist heat, scar mobilization, Manual therapy, and Re-evaluation  PLAN FOR NEXT SESSION: update HEP, manual therapy, consider dry needling as appropriate.    Everlean Alstrom. Graylon Good, PT, DPT 08/31/21, 8:17 PM  [facility] [address] [place], [place] [address] P: [phone] I F: [phone]

## 2021-09-02 NOTE — Therapy (Signed)
OUTPATIENT PHYSICAL THERAPY TREATMENT NOTE   Patient Name: Megan Cummings MRN: [id number] DOB:11-20-1975, 46 y.o., female Today's Date: 09/06/2021  PCP: Herbert Pun, MD REFERRING PROVIDER: Herbert Pun, MD  END OF SESSION:    Past Medical History:  Diagnosis Date   Basal cell carcinoma 09/21/2015   Left anterior shoulder. Superficial and nodular patterns.   Basal cell carcinoma 09/28/2015   Left spinal upper back. Superficial.   Basal cell carcinoma 12/06/2016   Right forearm. Superficial.    Breast cancer ([place])    Left   Complication of anesthesia    woke up during colonoscopy   Dysplastic nevus 12/06/2016   Right spinal mid back. Severe atypia and halo nevus effect, margin involved. Excised: 02/13/2017. Margins free   Dysplastic nevus 07/04/2017   Right upper back. Moderate atypia, close to margin.   Family history of colon cancer    Family history of lung cancer    Fibrocystic breast    History of kidney stones    h/o   Personal history of radiation therapy    Stress incontinence    Umbilical hernia 63/0160   Past Surgical History:  Procedure Laterality Date   BREAST BIOPSY Left 01/13/2021   [place] bx/ ribbon clip/dcis grade 1   BREAST LUMPECTOMY Left 01/25/2021   COLONOSCOPY N/A 11/18/2020   Procedure: COLONOSCOPY;  Surgeon: Virgel Manifold, MD;  Location: [facility];  Service: Endoscopy;  Laterality: N/A;   FOOT SURGERY Right 07/04/2010   PART MASTECTOMY,RADIO FREQUENCY LOCALIZER,AXILLARY SENTINEL NODE BIOPSY Left 01/25/2021   Procedure: PART MASTECTOMY,RADIO FREQUENCY LOCALIZER,AXILLARY SENTINEL NODE BIOPSY;  Surgeon: Herbert Pun, MD;  Location: [facility] ORS;  Service: General;  Laterality: Left;   Patient Active Problem List   Diagnosis Date Noted   Encounter for hepatitis C screening test for low risk patient 03/25/2021   Annual physical exam 03/25/2021   Encounter for lipid screening for cardiovascular disease 03/25/2021    Stress and adjustment reaction 03/25/2021   Dizziness, nonspecific 03/25/2021   Malignant neoplasm of breast, stage 1, estrogen receptor positive, left ([place]) 01/22/2021    REFERRING DIAG: acute pain of left shoulder   THERAPY DIAG:  No diagnosis found.  Rationale for Evaluation and Treatment: Rehabilitation  ONSET DATE: L lumpectomy performed 01/25/2021   PERTINENT HISTORY: Patient is a 46 y.o. female who presents to outpatient physical therapy with a referral for medical diagnosis acute left shoulder pain. This patient's chief complaints consist of clicking, pain, stiffness, and dysfunction at left shoulder and upper quarter when using left UE leading to the following functional deficits: difficulty with activities that require use of L UE especially activities that require use of L UE overhead such as working out, coaching, crossfit, loading groceries, lifting, pushing, pulling, kayaking, hobbies, etc. 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  Patient denies hx of stroke, seizures, lung problems, heart problems, diabetes, unexplained weight loss, unexplained changes in bowel or bladder problems, unexplained stumbling or dropping things, osteoporosis, and spinal surgery or shoulder surgery.   PRECAUTIONS: none  SUBJECTIVE: Patient reports she is feeling okay with no pain upon arrival. She did a workout on Saturday and her elbows are swollen and her biceps are really sore. She  did some ring rows with her feet on a box (she did 10x10 for a total of 100 rows). She did sit-ups (200) and air  squats (300) as well. She had previously been doing 4 sets of 500 meters. She did the [place] that she modified.   PAIN:  Are you having pain? Yes: NPRS scale: Current: ***/10  OBJECTIVE  SELF-REPORTED FUNCTION FOTO score: ***/100 (shoulder questionnaire)   TODAY'S TREATMENT:  Therapeutic exercise: to centralize symptoms and improve ROM, strength, muscular endurance, and activity tolerance required for successful completion of functional activities.   - kneeling B lat stretch with elbows on edge of plinth, holding PVC pipe in both hands rocking into maximal shoulder flexion and dropping chest towards floor with elbows flexed to at least 90 degrees. 5-30 second holds, 2x~ 5 reps (feels good pull but not pain).   - seated overhead pull using red theraband, 1x5 (easy), discussed use of stronger band and sitting under bar to attach it to to get more vertical force.   - Education on diagnosis, prognosis, POC, anatomy and physiology of current condition.   - standing B horizontal shoulder abduction with cuing for improved scapular motion with red theraband anchored in front.   - practiced "face pull" moving elbows forward to scaption position to decrease popping in left shoulder.   - Education on HEP including verbal description of exercises.    Pt required multimodal cuing for proper technique and to facilitate improved neuromuscular control, strength, range of motion, and functional ability resulting in improved performance and form.   PATIENT EDUCATION: Education details: Exercise purpose/form. Self management techniques. Education on diagnosis, prognosis, POC, anatomy and physiology of current condition. Education on HEP  Person educated: Patient Education method: Explanation, Demonstration, Tactile cues, and Verbal cues Education comprehension: verbalized understanding, returned demonstration, verbal cues required, tactile cues required, and needs further education     HOME EXERCISE  PROGRAM: - lat stretch - banded shoulder girdle exercises - to be refined at future visits.    ASSESSMENT:   CLINICAL IMPRESSION: Patient is a 46 y.o. female referred to outpatient physical therapy with a medical diagnosis of acute pain of left shoulder  who presents with signs and symptoms consistent with left shoulder pain and dysfunction complicated by recent treatment for left breast cancer  with recurring seromas and infection of breast and surrounding tissue s/p radiation treatment. Patient presents with significant joint stiffness, ROM, muscle tension, pain, scar tissue dysfunction, tissue integrity,  motor control, muscle performance (strength/power/endurance), and activity tolerance impairments that are limiting ability to complete her usual activities that require use of L UE especially activities that require use of L UE overhead such as working out, coaching, crossfit, loading groceries, lifting, pushing, pulling, kayaking, hobbies, etc, without difficulty. Patient will benefit from skilled physical therapy intervention to address current body structure impairments and activity limitations to improve function and work towards goals set in current POC in order to return to prior level of function or maximal functional improvement.    OBJECTIVE IMPAIRMENTS decreased activity tolerance, decreased endurance, decreased knowledge of condition, decreased ROM, decreased strength, hypomobility, increased edema, increased fascial restrictions, impaired perceived functional ability, increased muscle spasms, impaired flexibility, impaired tone, impaired UE functional use, improper body mechanics, and pain.    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    REHAB POTENTIAL: Good   CLINICAL DECISION MAKING: Evolving/moderate complexity   EVALUATION COMPLEXITY: Moderate     GOALS: Goals reviewed with patient? No   SHORT TERM GOALS: Target date: 09/14/2021   Patient will be independent with initial home exercise program for self-management of symptoms. Baseline: Initial HEP provided at IE (08/31/21); Goal status: In-progress     LONG TERM GOALS: Target date: 11/23/2021   Patient will be independent with a long-term home exercise program for self-management of symptoms.  Baseline: Initial HEP provided at IE (08/31/21); Goal status: In-progress   2.  Patient will demonstrate improved FOTO by equal or greater than 10 points to demonstrate improvement in overall condition and self-reported functional ability.  Baseline: to be measured visit 2 as appropriate (08/31/21); Goal status: In-progress   3.  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  Baseline: limited and painful - see objective exam (08/31/21); Goal status: In-progress   4.   Patient will demonstrate L shoulder MMT of equal or greater than 5/5 in all motions without increased pain to improve her ability to participate in and coach [employer], carry groceries, and complete her IADLs.  Baseline: painful and weak - see objective exam (08/31/21); Goal status: In-progress   5.  Patient will complete community, work and/or recreational activities without limitation due to current condition.  Baseline: difficulty with activities that require use of L UE especially activities that require use of L UE overhead such as working out, coaching, crossfit, loading groceries, lifting, pushing, pulling, kayaking, hobbies, etc (08/31/21); Goal status: In-progress   6.  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  Baseline: is unable to perform (08/31/2021);  Goal status: In-progress       PLAN: PT FREQUENCY: 1-2x/week   PT DURATION: 12 weeks   PLANNED INTERVENTIONS: Therapeutic exercises, Therapeutic activity, Neuromuscular re-education, Gait training, Patient/Family education, Joint mobilization, Aquatic Therapy, Dry Needling, Spinal mobilization, Cryotherapy, Moist heat, scar mobilization, Manual therapy, and Re-evaluation   PLAN FOR NEXT SESSION: update HEP, manual therapy, consider dry needling as appropriate.    Everlean Alstrom. Graylon Good, PT, DPT 09/06/21, 2:37 PM  [facility] [address] [place],  [address] P: [phone] I F: [phone]

## 2021-09-06 ENCOUNTER — Ambulatory Visit: Payer: BC Managed Care – PPO | Admitting: Occupational Therapy

## 2021-09-06 ENCOUNTER — Ambulatory Visit: Payer: BC Managed Care – PPO | Attending: General Surgery | Admitting: Physical Therapy

## 2021-09-06 DIAGNOSIS — M62838 Other muscle spasm: Secondary | ICD-10-CM | POA: Insufficient documentation

## 2021-09-06 DIAGNOSIS — M6281 Muscle weakness (generalized): Secondary | ICD-10-CM | POA: Insufficient documentation

## 2021-09-06 DIAGNOSIS — M25612 Stiffness of left shoulder, not elsewhere classified: Secondary | ICD-10-CM | POA: Insufficient documentation

## 2021-09-06 DIAGNOSIS — M25512 Pain in left shoulder: Secondary | ICD-10-CM | POA: Insufficient documentation

## 2021-09-07 ENCOUNTER — Encounter: Payer: Self-pay | Admitting: Physical Therapy

## 2021-09-07 NOTE — Therapy (Signed)
OUTPATIENT PHYSICAL THERAPY TREATMENT NOTE   Patient Name: Megan Cummings MRN: [id number] DOB:[birth year], 46 y.o., female Today's Date: 09/09/21   PCP: Herbert Pun, MD REFERRING PROVIDER: Herbert Pun, MD  END OF SESSION:   PT End of Session - 09/09/21 1534     Visit Number 3    Number of Visits 24    Date for PT Re-Evaluation 11/23/21    Authorization Type [employer] reporting period from 08/31/2021    Authorization Time Period VL 30 per cal yr    Authorization - Visit Number 3    Authorization - Number of Visits 30    Progress Note Due on Visit 10    PT Start Time 1125    PT Stop Time 1213    PT Time Calculation (min) 48 min    Activity Tolerance Patient tolerated treatment well;Patient limited by pain    Behavior During Therapy [facility] for tasks assessed/performed              Past Medical History:  Diagnosis Date   Basal cell carcinoma 09/21/2015   Left anterior shoulder. Superficial and nodular patterns.   Basal cell carcinoma 09/28/2015   Left spinal upper back. Superficial.   Basal cell carcinoma 12/06/2016   Right forearm. Superficial.    Breast cancer ([place])    Left   Complication of anesthesia    woke up during colonoscopy   Dysplastic nevus 12/06/2016   Right spinal mid back. Severe atypia and halo nevus effect, margin involved. Excised: 02/13/2017. Margins free   Dysplastic nevus 07/04/2017   Right upper back. Moderate atypia, close to margin.   Family history of colon cancer    Family history of lung cancer    Fibrocystic breast    History of kidney stones    h/o   Personal history of radiation therapy    Stress incontinence    Umbilical hernia 94/1740   Past Surgical History:  Procedure Laterality Date   BREAST BIOPSY Left 01/13/2021   [place] bx/ ribbon clip/dcis grade 1   BREAST LUMPECTOMY Left 01/25/2021   COLONOSCOPY N/A 11/18/2020   Procedure: COLONOSCOPY;  Surgeon: Virgel Manifold, MD;  Location: [facility]  ENDOSCOPY;  Service: Endoscopy;  Laterality: N/A;   FOOT SURGERY Right 07/04/2010   PART MASTECTOMY,RADIO FREQUENCY LOCALIZER,AXILLARY SENTINEL NODE BIOPSY Left 01/25/2021   Procedure: PART MASTECTOMY,RADIO FREQUENCY LOCALIZER,AXILLARY SENTINEL NODE BIOPSY;  Surgeon: Herbert Pun, MD;  Location: [facility] ORS;  Service: General;  Laterality: Left;   Patient Active Problem List   Diagnosis Date Noted   Encounter for hepatitis C screening test for low risk patient 03/25/2021   Annual physical exam 03/25/2021   Encounter for lipid screening for cardiovascular disease 03/25/2021   Stress and adjustment reaction 03/25/2021   Dizziness, nonspecific 03/25/2021   Malignant neoplasm of breast, stage 1, estrogen receptor positive, left ([place]) 01/22/2021    REFERRING DIAG: acute pain of left shoulder   THERAPY DIAG:  Left shoulder pain, unspecified chronicity  Stiffness of left shoulder, not elsewhere classified  Muscle weakness (generalized)  Other muscle spasm  Rationale for Evaluation and Treatment: Rehabilitation  ONSET DATE: L lumpectomy performed 01/25/2021   PERTINENT HISTORY: Patient is a 46 y.o. female who presents to outpatient physical therapy with a referral for medical diagnosis acute left shoulder pain. This patient's chief complaints consist of clicking, pain, stiffness, and dysfunction at left shoulder and upper quarter when using left UE leading to the following functional deficits: difficulty with activities that require  use of L UE especially activities that require use of L UE overhead such as working out, coaching, crossfit, loading groceries, lifting, pushing, pulling, kayaking, hobbies, etc. 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  Patient denies hx of stroke, seizures, lung problems, heart problems, diabetes, unexplained weight loss, unexplained changes in bowel or bladder problems, unexplained stumbling or dropping things, osteoporosis, and spinal surgery or shoulder surgery.   PRECAUTIONS: none  SUBJECTIVE: Patient reports she is feeling well with no pain at rest upon arrival. She does have pain when she moves her left shoulder into flexion but she states her motion feels better. She states she thinks she is bruised at her left lat where STM was performed. She states it feels bruised and she thought she could see some discoloration (but her husband did not see it). She would like to do dry needling today after Dr. Windell Moment approved it by instant message on 09/07/2021.   PAIN:  Are you having pain? no  OBJECTIVE  TODAY'S TREATMENT:  Therapeutic exercise: to centralize symptoms and improve ROM, strength, muscular endurance, and activity tolerance required for successful completion of functional activities.  - Upper body ergometer at level 5 encourage joint nutrition, warm tissue, induce analgesic effect of aerobic exercise, improve muscular strength and endurance,  and prepare for remainder of session. 1x5 min reversing direction every 1 min. (Manual therapy/dry needling  - see below).  - standing overhead lower trap setting in Y position, 1x10 facing wall - standing overhead lower trap setting in Y position with lateral tension on RTB, 2x10 facing wall - standing B shoulder scaption to end range while holding RTB with lateral tension, 1x10 - seated scapular elevation/depression while holding lat bar loaded with 20# cable. 2x10 allowing enough scapular elevation to provide gentle to moderate pull/pain at concordant location of scar tissue below L  breast.  - seated lat pull with stepwise scapular depression with 20# cable, 1x10.  - Education on HEP including overhead exercises for scapular elevation/depression.    Manual therapy: to reduce pain and tissue tension, improve range of motion, neuromodulation, in order to promote improved ability to complete functional activities. PRONE - prone STM to left periscapular region focusing on rhomboids and lat - prone mobilization of L scapula into winged position.  SUPINE  - STM to left lat and subscapularis.  - PROM left shoulder flexion and scaption 1x10-20  Modality: (unbilled) Dry needling performed to patient's left subscapularis and latissimus dorsi to decrease pain and spasms along patient's axillary and shoulder region with patient in prone utilizing 2 dry needle(s) .68mm x 22mm with 1 stick at left subscapularis and 1 stick at left latissimus dorsi and with patient in supine utilizing 1 dry needle .80mm x 36mm with 1 stick at left lat/subscapularis. Patient educated about the risks and benefits from therapy and verbally consents to treatment.  Dry needling performed by Everlean Alstrom. Graylon Good PT, DPT who is certified in this technique.  Pt required multimodal cuing for proper technique and to facilitate improved neuromuscular control, strength, range of motion, and functional ability resulting in improved performance and form.   PATIENT EDUCATION: Education details: Exercise purpose/form. Self management techniques. Education on HEP. Dry  needling.  Person educated: Patient Education method: Explanation, Demonstration, Tactile cues, and Verbal cues Education comprehension: verbalized understanding, returned demonstration, verbal cues required, tactile cues required, and needs further education     HOME EXERCISE PROGRAM: - lat stretch - scaption - modified push up - scapular depression/elevation, lat pull   ASSESSMENT:   CLINICAL IMPRESSION: Patient tolerated treatment well overall with  increased overhead motion in L shoulder by end of session. Patient feels strong concordant pulling at scar tissue on chest with scapular elevation and is likely related to her difficulty with pull ups and toe to rig exercise. Educated patient on exercises to gently stretch to improve ROM while working muscles in comfortable range. Patient did not have a twitch response to dry needling today but continues to be very tight and tender in the left subscapularis and lat dorsi. No visible bruising present upon visual observation. Patient would benefit from continued management of limiting condition by skilled physical therapist to address remaining impairments and functional limitations to work towards stated goals and return to PLOF or maximal functional independence.   Patient is a 46 y.o. female referred to outpatient physical therapy with a medical diagnosis of acute pain of left shoulder  who presents with signs and symptoms consistent with left shoulder pain and dysfunction complicated by recent treatment for left breast cancer  with recurring seromas and infection of breast and surrounding tissue s/p radiation treatment. Patient presents with significant joint stiffness, ROM, muscle tension, pain, scar tissue dysfunction, tissue integrity,  motor control, muscle performance (strength/power/endurance), and activity tolerance impairments that are limiting ability to complete her usual activities that require use of L UE especially activities that require use of L UE overhead such as working out, coaching, crossfit, loading groceries, lifting, pushing, pulling, kayaking, hobbies, etc, without difficulty. Patient will benefit from skilled physical therapy intervention to address current body structure impairments and activity limitations to improve function and work towards goals set in current POC in order to return to prior level of function or maximal functional improvement.    OBJECTIVE IMPAIRMENTS decreased  activity tolerance, decreased endurance, decreased knowledge of condition, decreased ROM, decreased strength, hypomobility, increased edema, increased fascial restrictions, impaired perceived functional ability, increased muscle spasms, impaired flexibility, impaired tone, impaired UE functional use, improper body mechanics, and pain.    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    REHAB POTENTIAL: Good   CLINICAL DECISION MAKING: Evolving/moderate complexity   EVALUATION COMPLEXITY: Moderate     GOALS: Goals reviewed with patient? No   SHORT TERM GOALS: Target date: 09/14/2021   Patient will be independent with initial home exercise program for self-management of symptoms. Baseline: Initial HEP provided at IE (08/31/21); Goal status: In-progress     LONG TERM GOALS: Target date: 11/23/2021   Patient will  be independent with a long-term home exercise program for self-management of symptoms.  Baseline: Initial HEP provided at IE (08/31/21); Goal status: In-progress   2.  Patient will demonstrate  improved FOTO by equal or greater than 10 points to demonstrate improvement in overall condition and self-reported functional ability.  Baseline: to be measured visit 2 as appropriate (08/31/21); Goal status: In-progress   3.  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  Baseline: limited and painful - see objective exam (08/31/21); Goal status: In-progress   4.  Patient will demonstrate L shoulder MMT of equal or greater than 5/5 in all motions without increased pain to improve her ability to participate in and coach [employer], carry groceries, and complete her IADLs.  Baseline: painful and weak - see objective exam (08/31/21); Goal status: In-progress   5.  Patient will complete community, work and/or recreational activities without limitation due to current condition.  Baseline: difficulty with activities that require use of L UE especially activities that require use of L UE overhead such as working out, coaching, crossfit, loading groceries, lifting, pushing, pulling, kayaking, hobbies, etc (08/31/21); Goal status: In-progress   6.  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  Baseline: is unable to perform (08/31/2021);  Goal status: In-progress       PLAN: PT FREQUENCY: 1-2x/week   PT DURATION: 12 weeks   PLANNED INTERVENTIONS: Therapeutic exercises, Therapeutic activity, Neuromuscular re-education, Gait training, Patient/Family education, Joint mobilization, Aquatic Therapy, Dry Needling, Spinal mobilization, Cryotherapy, Moist heat, scar mobilization, Manual therapy, and  Re-evaluation   PLAN FOR NEXT SESSION: update HEP, manual therapy, consider dry needling as appropriate.    Everlean Alstrom. Graylon Good, PT, DPT 09/09/21, 3:49 PM  [facility] [address] [place], [place] [address] P: [phone] I F: [phone]

## 2021-09-09 ENCOUNTER — Encounter: Payer: Self-pay | Admitting: Physical Therapy

## 2021-09-09 ENCOUNTER — Ambulatory Visit: Payer: BC Managed Care – PPO | Admitting: Physical Therapy

## 2021-09-09 DIAGNOSIS — M6281 Muscle weakness (generalized): Secondary | ICD-10-CM | POA: Diagnosis not present

## 2021-09-09 DIAGNOSIS — M25612 Stiffness of left shoulder, not elsewhere classified: Secondary | ICD-10-CM

## 2021-09-09 DIAGNOSIS — M25512 Pain in left shoulder: Secondary | ICD-10-CM

## 2021-09-09 DIAGNOSIS — M62838 Other muscle spasm: Secondary | ICD-10-CM | POA: Diagnosis not present

## 2021-09-27 ENCOUNTER — Other Ambulatory Visit: Payer: Self-pay | Admitting: Family Medicine

## 2021-09-27 ENCOUNTER — Inpatient Hospital Stay: Payer: BC Managed Care – PPO | Attending: Oncology

## 2021-09-27 DIAGNOSIS — Z170 Estrogen receptor positive status [ER+]: Secondary | ICD-10-CM | POA: Diagnosis not present

## 2021-09-27 DIAGNOSIS — C50912 Malignant neoplasm of unspecified site of left female breast: Secondary | ICD-10-CM | POA: Diagnosis not present

## 2021-09-27 DIAGNOSIS — Z5111 Encounter for antineoplastic chemotherapy: Secondary | ICD-10-CM | POA: Diagnosis not present

## 2021-09-27 MED ORDER — GOSERELIN ACETATE 3.6 MG ~~LOC~~ IMPL
3.6000 mg | DRUG_IMPLANT | SUBCUTANEOUS | Status: DC
  Administered 2021-09-27: 3.6 mg via SUBCUTANEOUS
  Filled 2021-09-27: qty 3.6

## 2021-10-04 ENCOUNTER — Ambulatory Visit: Payer: BC Managed Care – PPO | Attending: General Surgery | Admitting: Occupational Therapy

## 2021-10-04 DIAGNOSIS — L905 Scar conditions and fibrosis of skin: Secondary | ICD-10-CM | POA: Insufficient documentation

## 2021-10-04 DIAGNOSIS — M25512 Pain in left shoulder: Secondary | ICD-10-CM | POA: Diagnosis not present

## 2021-10-04 DIAGNOSIS — M6281 Muscle weakness (generalized): Secondary | ICD-10-CM | POA: Diagnosis not present

## 2021-10-04 DIAGNOSIS — M62838 Other muscle spasm: Secondary | ICD-10-CM | POA: Insufficient documentation

## 2021-10-04 DIAGNOSIS — M25612 Stiffness of left shoulder, not elsewhere classified: Secondary | ICD-10-CM | POA: Insufficient documentation

## 2021-10-04 NOTE — Therapy (Signed)
[facility] [address], [place], [address] Phone: [phone]   Fax:  [phone]  Occupational Therapy Treatment  Patient Details  Name: Megan Cummings MRN: [id number] Date of Birth: [birth year] Referring Provider (OT): Dr Windell Moment   Encounter Date: 10/04/2021   OT End of Session - 10/04/21 0851     Visit Number 14    Number of Visits 18    Date for OT Re-Evaluation 11/23/21    OT Start Time 0817    OT Stop Time 0848    OT Time Calculation (min) 31 min    Activity Tolerance Patient tolerated treatment well    Behavior During Therapy [facility] for tasks assessed/performed             Past Medical History:  Diagnosis Date   Basal cell carcinoma 09/21/2015   Left anterior shoulder. Superficial and nodular patterns.   Basal cell carcinoma 09/28/2015   Left spinal upper back. Superficial.   Basal cell carcinoma 12/06/2016   Right forearm. Superficial.    Breast cancer ([place])    Left   Complication of anesthesia    woke up during colonoscopy   Dysplastic nevus 12/06/2016   Right spinal mid back. Severe atypia and halo nevus effect, margin involved. Excised: 02/13/2017. Margins free   Dysplastic nevus 07/04/2017   Right upper back. Moderate atypia, close to margin.   Family history of colon cancer    Family history of lung cancer    Fibrocystic breast    History of kidney stones    h/o   Personal history of radiation therapy    Stress incontinence    Umbilical hernia [id number]/5809    Past Surgical History:  Procedure Laterality Date   BREAST BIOPSY Left 01/13/2021   [place] bx/ ribbon clip/dcis grade 1   BREAST LUMPECTOMY Left 01/25/2021   COLONOSCOPY N/A 11/18/2020   Procedure: COLONOSCOPY;  Surgeon: Virgel Manifold, MD;  Location: [facility] ENDOSCOPY;  Service: Endoscopy;  Laterality: N/A;   FOOT SURGERY Right 07/04/2010   PART MASTECTOMY,RADIO FREQUENCY LOCALIZER,AXILLARY SENTINEL NODE BIOPSY  Left 01/25/2021   Procedure: PART MASTECTOMY,RADIO FREQUENCY LOCALIZER,AXILLARY SENTINEL NODE BIOPSY;  Surgeon: Herbert Pun, MD;  Location: [facility] ORS;  Service: General;  Laterality: Left;    There were no vitals filed for this visit.   Subjective Assessment - 10/04/21 0849     Subjective  I am getting better with my motion and strength.  But I do not do any pull-ups or rope climbing yet.  But I wanted you to look at it because I thought I seen the other day a little bit of cording in my armpit.  But not sure.  MI scar is still pulled in.    Pertinent History Megan Cummings is a 46 y.o.female patient who comes for follow up her left breast mastitis.    Patient with left mastitis with seroma. We have been doing serial aspirations with slowly improvement. Today there is no significant fluid to aspirate. The erythema is also improving. The pain is also improving. She still has significant stiffness and pain on the lateral aspect of the left breast that extends to the left axillary area  - last visit 07/08/21 and follow up again surgeon tomorrow    Patient Stated Goals I want my motion and pain better so I can do cross fit    Currently in Pain? No/denies  LYMPHEDEMA/ONCOLOGY QUESTIONNAIRE - 10/04/21 0001       Right Upper Extremity Lymphedema   15 cm Proximal to Olecranon Process 29.5 cm    10 cm Proximal to Olecranon Process 28.5 cm    Olecranon Process 25 cm      Left Upper Extremity Lymphedema   15 cm Proximal to Olecranon Process 30 cm    10 cm Proximal to Olecranon Process 29 cm    Olecranon Process 25.4 cm               Patient working out more and Media planner.  Reassess bilateral upper extremity circumference.  Left compared to the right increased somewhat but we will continue to monitor patient denies any thoracic lymphedema.  Patient was educated on signs symptoms and prevention before.  Patient return to OT for assessing if any cording present.  As  well as scar still adhere, keloid and hypertrophic.  From 6-9 o'clock on left breast. Patient reports she is trying to do some scar massage over [employer] scar pad but do not see improvement in the scar being" indented" Patient's tenderness from 12:00 to 6:00 improved greatly on left breast.  Upon assessment patient did not show any lymphatic cording in axilla and overhead range of motion or in child's pose. Scar assessed patient still keloid in indented from 6:00 to 9:00.  Scar mobilization was done using Coban, providing more traction and not sliding over scar.  Scar mobilization done and patient added on doing at home Patient tolerating better than prior.  Patient educated to use Coban at home precautions reviewed. Was able to use large extractor On medial scar with great success patient tolerating well.  Kinesiotape was done again and patient educated in use of it but this date changed it to 100% pull from scar out at 7:00 9:00 and 11:00.  With the 100% pull from base of first and second out to 5:00. Upon assessment kinesiotaping with range of motion showed mobilization and patient able to tolerate well. Patient will will continue with home program for 10 days and follow-up               OT Education - 10/04/21 0851     Education Details changes in HEP , scar tissue massage/taping    Person(s) Educated Patient    Methods Explanation;Demonstration;Tactile cues;Verbal cues;Handout    Comprehension Returned demonstration;Verbal cues required              OT Short Term Goals - 08/31/21 1035       OT SHORT TERM GOAL #1   Title Patient to be independent in the home program doing scar tissue massage, compression for left breast to decrease pain less than 2/10 with range of motion touch    Baseline Unable to tolerate any soft tissue or palpation on L lateral breast superior breast into axilla.  5-7/10 pain with abduction of L shoulder to 90 and scar massage or soft tissue on L  breast and pec  NOW no pain wiht AROM for shoulder but pull over superior breast, axilla and scapula- scar tissue mobs still tender but no pain with ROM    Status Achieved               OT Long Term Goals - 08/31/21 1036       OT LONG TERM GOAL #1   Title Pain in L arm decrease to less than 3/10 to do pull over shirt and do hair    Baseline  pain 8/10 in L arm at 80 degrees  - slight pull 1-2/10 and AROM WNL  NOW AROM WFL can pull shirt off pain free - but tight    Status Achieved      OT LONG TERM GOAL #2   Title Cording in L UE decrease for pt to show WNL L UE AROM to return to cross fit or ADL's/IADL"S    Baseline Cording in axilla - pain 8/10 to 80 degrees to ABD and Flexion 108 degrees L UE- NOW AROM WNL slight pull but less than 1-2/10 - cannot do all crossfit exercises NOW refer to PT for over head strengthening -pt report click - but AROM WFL and pain free -pull or stretch    Status Achieved      OT LONG TERM GOAL #3   Title Pt to be independent in HEP to increase L UE AROM without increase symptoms    Baseline increase symptoms - pull over chest , axilla and scapula    Status Achieved      OT LONG TERM GOAL #4   Title Monitor need for compression for L upper quadrant to decrease lymphedema or circumference in L UE    Baseline Patient to have a preventative sleeve but did not need to wear it the last few weeks.  Measurements within normal limits compared to the right this date.  Continue to monitor as she increases her strengthening    Time 12    Period Weeks    Status On-going    Target Date 11/23/21      OT LONG TERM GOAL #5   Title Left shoulder active range of motion increased to within normal limits with pain less than a 2 and left breast    Baseline Active range of motion within functional limits pain-free, but do feel or hear a click in shoulder for overhead strengthening no pull over scar tissue stiffness in the morning mostly over superior breast, axilla and  scapula.  Patient referred to PT for shoulder strengthening.    Status Deferred                   Plan - 10/04/21 0852     Clinical Impression Statement Pt present at OT eval  end of last year with diagnosis L partial mastectomy 01/25/21 - Pt had cording in L UE and pain with AROM  improved greastly  Pt was seen in January with radiation nearly done , had great ROM and was back to do some of her exercieses with ligth crossfit and was going to start yoga. Had to hold off on manual and chipbag because of skin irriation from radiation. Pt  did not return at that time after 3-4 wks as recommended.  Patient developed end of March cellulitis/mastitis in left breast need to aspirations and was referred back to OT.  Patient was seen for scar massage range of motion that improved greatly.  Patient was seen 6 weeks ago.  Patient do not show any cording.  But scar tissue is still at here from 6:00 to 9:00.  Patient was able to tolerate more scar mobilization than before.  Change program for scar massage as well as change kinesiotaping for scar mobilization during the day.  Scar still adhere, keloid and hypertrophic patient to follow-up with me in about 10 days again prior to appointment with Dr. Donella Stade.  Due to cyst circumference of bilateral upper extremities monitoring risk for lymphedema.  Left upper arm measurements was increased  compared to 6 weeks ago we will continue to monitor.  Reinforced with patient again to do scar mobilization morning and evening before getting up with arm over head.  Pt cont to be limited by increase scar adhesion tissue  and fibrosis- limiting her Ind in ADL's and IADl's. Pt can cont to benefit from cont  OT services.    OT Occupational Profile and History Problem Focused Assessment - Including review of records relating to presenting problem    Occupational performance deficits (Please refer to evaluation for details): ADL's;IADL's;Play;Leisure;Social Participation    Body  Structure / Function / Physical Skills ADL;Decreased knowledge of precautions;Flexibility;Scar mobility;ROM;Edema;Pain    Rehab Potential Good    Clinical Decision Making Limited treatment options, no task modification necessary    Comorbidities Affecting Occupational Performance: None    Modification or Assistance to Complete Evaluation  No modification of tasks or assist necessary to complete eval    OT Frequency 1x / week    OT Duration 6 weeks    OT Treatment/Interventions Self-care/ADL training;Manual lymph drainage;Contrast Bath;Cryotherapy;Manual Therapy;Patient/family education;Passive range of motion;Therapeutic exercise;Scar mobilization    Consulted and Agree with Plan of Care Patient             Patient will benefit from skilled therapeutic intervention in order to improve the following deficits and impairments:   Body Structure / Function / Physical Skills: ADL, Decreased knowledge of precautions, Flexibility, Scar mobility, ROM, Edema, Pain       Visit Diagnosis: Scar tissue    Problem List Patient Active Problem List   Diagnosis Date Noted   Encounter for hepatitis C screening test for low risk patient 03/25/2021   Annual physical exam 03/25/2021   Encounter for lipid screening for cardiovascular disease 03/25/2021   Stress and adjustment reaction 03/25/2021   Dizziness, nonspecific 03/25/2021   Malignant neoplasm of breast, stage 1, estrogen receptor positive, left ([place]) 01/22/2021    Rosalyn Gess, OTR/L,CLT 10/04/2021, 8:57 AM  [place] [facility] [address] [place], [address] Phone: [phone]   Fax:  [phone]  Name: MADAILEIN LONDO MRN: [id number] Date of Birth: 12/26/1975

## 2021-10-07 ENCOUNTER — Ambulatory Visit: Payer: BC Managed Care – PPO | Admitting: Physical Therapy

## 2021-10-07 ENCOUNTER — Encounter: Payer: Self-pay | Admitting: Physical Therapy

## 2021-10-07 DIAGNOSIS — M62838 Other muscle spasm: Secondary | ICD-10-CM | POA: Diagnosis not present

## 2021-10-07 DIAGNOSIS — M25512 Pain in left shoulder: Secondary | ICD-10-CM | POA: Diagnosis not present

## 2021-10-07 DIAGNOSIS — M25612 Stiffness of left shoulder, not elsewhere classified: Secondary | ICD-10-CM | POA: Diagnosis not present

## 2021-10-07 DIAGNOSIS — M6281 Muscle weakness (generalized): Secondary | ICD-10-CM | POA: Diagnosis not present

## 2021-10-07 DIAGNOSIS — L905 Scar conditions and fibrosis of skin: Secondary | ICD-10-CM | POA: Diagnosis not present

## 2021-10-07 NOTE — Therapy (Signed)
OUTPATIENT PHYSICAL THERAPY TREATMENT NOTE   Patient Name: Megan Cummings MRN: [id number] DOB:1976/04/04, 46 y.o., female Today's Date: 10/07/21   PCP: Herbert Pun, MD REFERRING PROVIDER: Herbert Pun, MD  END OF SESSION:   PT End of Session - 10/07/21 1943     Visit Number 4    Number of Visits 24    Date for PT Re-Evaluation 11/23/21    Authorization Type [employer] reporting period from 08/31/2021    Authorization Time Period VL 30 per cal yr    Authorization - Visit Number 4    Authorization - Number of Visits 30    Progress Note Due on Visit 10    PT Start Time 1525    PT Stop Time 1605    PT Time Calculation (min) 40 min    Activity Tolerance Patient tolerated treatment well;Patient limited by pain    Behavior During Therapy [facility] for tasks assessed/performed               Past Medical History:  Diagnosis Date   Basal cell carcinoma 09/21/2015   Left anterior shoulder. Superficial and nodular patterns.   Basal cell carcinoma 09/28/2015   Left spinal upper back. Superficial.   Basal cell carcinoma 12/06/2016   Right forearm. Superficial.    Breast cancer ([place])    Left   Complication of anesthesia    woke up during colonoscopy   Dysplastic nevus 12/06/2016   Right spinal mid back. Severe atypia and halo nevus effect, margin involved. Excised: 02/13/2017. Margins free   Dysplastic nevus 07/04/2017   Right upper back. Moderate atypia, close to margin.   Family history of colon cancer    Family history of lung cancer    Fibrocystic breast    History of kidney stones    h/o   Personal history of radiation therapy    Stress incontinence    Umbilical hernia 81/0175   Past Surgical History:  Procedure Laterality Date   BREAST BIOPSY Left 01/13/2021   [place] bx/ ribbon clip/dcis grade 1   BREAST LUMPECTOMY Left 01/25/2021   COLONOSCOPY N/A 11/18/2020   Procedure: COLONOSCOPY;  Surgeon: Virgel Manifold, MD;  Location: [facility]  ENDOSCOPY;  Service: Endoscopy;  Laterality: N/A;   FOOT SURGERY Right 07/04/2010   PART MASTECTOMY,RADIO FREQUENCY LOCALIZER,AXILLARY SENTINEL NODE BIOPSY Left 01/25/2021   Procedure: PART MASTECTOMY,RADIO FREQUENCY LOCALIZER,AXILLARY SENTINEL NODE BIOPSY;  Surgeon: Herbert Pun, MD;  Location: [facility] ORS;  Service: General;  Laterality: Left;   Patient Active Problem List   Diagnosis Date Noted   Encounter for hepatitis C screening test for low risk patient 03/25/2021   Annual physical exam 03/25/2021   Encounter for lipid screening for cardiovascular disease 03/25/2021   Stress and adjustment reaction 03/25/2021   Dizziness, nonspecific 03/25/2021   Malignant neoplasm of breast, stage 1, estrogen receptor positive, left ([place]) 01/22/2021    REFERRING DIAG: acute pain of left shoulder   THERAPY DIAG:  Left shoulder pain, unspecified chronicity  Stiffness of left shoulder, not elsewhere classified  Muscle weakness (generalized)  Other muscle spasm  Rationale for Evaluation and Treatment: Rehabilitation  ONSET DATE: L lumpectomy performed 01/25/2021   PERTINENT HISTORY: Patient is a 46 y.o. female who presents to outpatient physical therapy with a referral for medical diagnosis acute left shoulder pain. This patient's chief complaints consist of clicking, pain, stiffness, and dysfunction at left shoulder and upper quarter when using left UE leading to the following functional deficits: difficulty with activities that  require use of L UE especially activities that require use of L UE overhead such as working out, coaching, crossfit, loading groceries, lifting, pushing, pulling, kayaking, hobbies, etc. 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  Patient denies hx of stroke, seizures, lung problems, heart problems, diabetes, unexplained weight loss, unexplained changes in bowel or bladder problems, unexplained stumbling or dropping things, osteoporosis, and spinal surgery or shoulder surgery.   PRECAUTIONS: none  SUBJECTIVE: Patient arrives with report of no pain upon arrival. She states she has had random spasms in her pec, lat, and posterior shoulder. She state they don't seem to correlate to her workouts. She was not sore after last PT session. She has not done pull ups since last PT session. She puts a strong resistance resistance band over head and pulls. She does not feel like she is making any progress in her tolerance for this. She did not feel any difference after the dry needling but she states she started having spasms after the dry needling. She has been working out every day stretching or regular work outs. She is now able to do toes to rig now but after a bout 15 reps it will pull on her lat region so she lets go of that arm. She usually does up to 75 for a workout.   PAIN:  Are you having pain? no  OBJECTIVE  TODAY'S TREATMENT:  Therapeutic exercise: to centralize symptoms and improve ROM, strength, muscular endurance, and activity tolerance required for successful completion of functional activities.  - Upper body ergometer at level 6 encourage joint nutrition, warm tissue, induce analgesic effect of aerobic exercise, improve muscular strength and endurance,  and prepare for remainder of session. 1x5 min reversing direction every 1 min. - assessment of tolerance for pulling from ovehead seated at lat bar loaded with 25#, allowing bar to pull shoulders up towards ears. Patient report concordant pain in scar tissue around breast, pec, and lat regions.  -  explored, demonstrated, and performed stretching of the L scapula and shoulder complex with pulling from overhead using TRX, TM bar, and overhead bar.  - retest of pulling overhead following manual therapy (no change in pulling but L GH elevation less uncomfortable).  - L pec minor stretch on wall (difficult to isolate pec minor due to pulling around breast scar tissue) - L pec minor stretch in door with shoulder abducted slightly past 90 degrees and elbow flexed (improved isolation of L pec minor).  - Review of modified push ups on plinth with reps at decreasing height until patient felt moderate pull at left pec muscles, 1x10 at this level.  - education on progressive overload, progressions, and tissue stretching for adaptation without reinjury.   Manual therapy: to reduce pain and tissue tension, improve range of motion, neuromodulation, in order to promote improved ability to complete functional activities. SIDELYING (L side up) - L scapular mobilizations in all directions, especially upward rotation (reproduces pulling) PRONE -B  scapular mobilizations L > R working hand under left scapular angle and distracting from thorax as well as mobilizing into upward rotation.   SUPINE  - STM to left lat and subscapularis, pec major and minor (concordant pain here).  -  PROM left shoulder flexion and abduction with manual facilitation and mobilization of scapula into upward rotated position.   Pt required multimodal cuing for proper technique and to facilitate improved neuromuscular control, strength, range of motion, and functional ability resulting in improved performance and form.   PATIENT EDUCATION: Education details: Exercise purpose/form. Self management techniques. Education on HEP.  Person educated: Patient Education method: Explanation, Demonstration, Tactile cues, and Verbal cues Education comprehension: verbalized understanding, returned demonstration, verbal cues required, tactile cues  required, and needs further education     HOME EXERCISE PROGRAM: - lat stretch - scaption - modified push up - scapular depression/elevation, lat pull - pec minor stretch   ASSESSMENT:   CLINICAL IMPRESSION: Patient tolerated treatment well with no increase in pain by end of session. She continued to be limited by pulling pain at the L pec, breast scar tissue, and (less so) lat region especially with scapular elevation and posterior tilt while arm is overhead. Patient appears to be making progress and demonstrates improved L shoulder ROM and tolerance for toes to rig exercise. She continues to have decrease motion in the left scapulothoracic and GH joints with pulling at the breast scar tissue and pec minor region most limiting. Patient had forgotten to use modified hanging position to stretch this region so this was discussed and patient appeared to understand how to use progressive strengthening and stretching to improve tissue tolerance and length. Plan to continue working on improving tolerance and ROM next session. Patient would benefit from continued management of limiting condition by skilled physical therapist to address remaining impairments and functional limitations to work towards stated goals and return to PLOF or maximal functional independence.   Patient is a 46 y.o. female referred to outpatient physical therapy with a medical diagnosis of acute pain of left shoulder  who presents with signs and symptoms consistent with left shoulder pain and dysfunction complicated by recent treatment for left breast cancer  with recurring seromas and infection of breast and surrounding tissue s/p radiation treatment. Patient presents with significant joint stiffness, ROM, muscle tension, pain, scar tissue dysfunction, tissue integrity,  motor control, muscle performance (strength/power/endurance), and activity tolerance impairments that are limiting ability to complete her usual activities that  require use of L UE especially activities that require use of L UE overhead such as working out, coaching, crossfit, loading groceries, lifting, pushing, pulling, kayaking, hobbies, etc, without difficulty. Patient will benefit from skilled physical therapy intervention to address current body structure impairments and activity limitations to improve function and work towards goals set in current POC in order to return to prior level of function or maximal functional improvement.    OBJECTIVE IMPAIRMENTS decreased activity tolerance, decreased endurance, decreased knowledge of condition, decreased ROM, decreased strength, hypomobility, increased edema, increased fascial restrictions, impaired perceived functional ability, increased muscle spasms, impaired flexibility, impaired tone, impaired UE functional use, improper body mechanics, and pain.    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    REHAB POTENTIAL: Good   CLINICAL DECISION MAKING: Evolving/moderate complexity   EVALUATION COMPLEXITY: Moderate      GOALS: Goals reviewed with patient? No   SHORT TERM GOALS: Target date: 09/14/2021   Patient will be independent with initial home exercise program for self-management of symptoms. Baseline: Initial HEP provided at IE (08/31/21); Goal status: In-progress     LONG TERM GOALS: Target date: 11/23/2021   Patient will be independent with a long-term home exercise program for self-management of symptoms.  Baseline: Initial HEP provided at IE (08/31/21); Goal status: In-progress   2.  Patient will demonstrate improved FOTO by equal or greater than 10 points to demonstrate improvement in overall condition and self-reported functional ability.  Baseline: to be measured visit 2 as appropriate (08/31/21); Goal status: In-progress   3.  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  Baseline: limited and painful - see objective exam (08/31/21); Goal status: In-progress   4.  Patient will demonstrate L shoulder MMT of equal or greater than 5/5 in all motions without increased pain to improve her ability to participate in and coach [employer], carry groceries, and complete her IADLs.  Baseline: painful and weak - see objective exam (08/31/21); Goal status: In-progress   5.  Patient will complete community, work and/or recreational activities without limitation due to current condition.  Baseline: difficulty with activities that require use of L UE especially activities that require use of L UE overhead such as working out, coaching, crossfit, loading groceries, lifting, pushing, pulling, kayaking, hobbies, etc (08/31/21); Goal status: In-progress   6.  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  Baseline: is unable to perform (08/31/2021);  Goal status: In-progress       PLAN: PT FREQUENCY: 1-2x/week   PT DURATION: 12 weeks   PLANNED INTERVENTIONS: Therapeutic exercises, Therapeutic activity, Neuromuscular re-education, Gait training, Patient/Family education, Joint mobilization, Aquatic Therapy, Dry Needling, Spinal mobilization, Cryotherapy, Moist heat, scar mobilization, Manual therapy, and Re-evaluation   PLAN FOR NEXT SESSION: update HEP, manual therapy, consider dry needling as appropriate.    Everlean Alstrom. Graylon Good, PT, DPT 10/07/21, 7:56 PM  [facility] [address] [place], [place] [address] P: [phone] I F: [phone]

## 2021-10-13 ENCOUNTER — Ambulatory Visit: Payer: BC Managed Care – PPO | Admitting: Occupational Therapy

## 2021-10-13 ENCOUNTER — Ambulatory Visit: Payer: BC Managed Care – PPO | Admitting: Physical Therapy

## 2021-10-13 ENCOUNTER — Encounter: Payer: Self-pay | Admitting: Physical Therapy

## 2021-10-13 DIAGNOSIS — M25512 Pain in left shoulder: Secondary | ICD-10-CM | POA: Diagnosis not present

## 2021-10-13 DIAGNOSIS — M25612 Stiffness of left shoulder, not elsewhere classified: Secondary | ICD-10-CM

## 2021-10-13 DIAGNOSIS — M62838 Other muscle spasm: Secondary | ICD-10-CM | POA: Diagnosis not present

## 2021-10-13 DIAGNOSIS — L905 Scar conditions and fibrosis of skin: Secondary | ICD-10-CM | POA: Diagnosis not present

## 2021-10-13 DIAGNOSIS — M6281 Muscle weakness (generalized): Secondary | ICD-10-CM | POA: Diagnosis not present

## 2021-10-13 NOTE — Therapy (Signed)
OUTPATIENT PHYSICAL THERAPY TREATMENT NOTE   Patient Name: Megan Cummings MRN: [id number] DOB:[birth year], 46 y.o., female Today's Date: 10/13/21   PCP: Herbert Pun, MD REFERRING PROVIDER: Herbert Pun, MD  END OF SESSION:   PT End of Session - 10/13/21 1438     Visit Number 5    Number of Visits 24    Date for PT Re-Evaluation 11/23/21    Authorization Type [employer] reporting period from 08/31/2021    Authorization Time Period VL 30 per cal yr    Authorization - Visit Number 5    Authorization - Number of Visits 30    Progress Note Due on Visit 10    PT Start Time 7893    PT Stop Time 1513    PT Time Calculation (min) 40 min    Activity Tolerance Patient tolerated treatment well;Patient limited by pain    Behavior During Therapy [facility] for tasks assessed/performed                Past Medical History:  Diagnosis Date   Basal cell carcinoma 09/21/2015   Left anterior shoulder. Superficial and nodular patterns.   Basal cell carcinoma 09/28/2015   Left spinal upper back. Superficial.   Basal cell carcinoma 12/06/2016   Right forearm. Superficial.    Breast cancer ([place])    Left   Complication of anesthesia    woke up during colonoscopy   Dysplastic nevus 12/06/2016   Right spinal mid back. Severe atypia and halo nevus effect, margin involved. Excised: 02/13/2017. Margins free   Dysplastic nevus 07/04/2017   Right upper back. Moderate atypia, close to margin.   Family history of colon cancer    Family history of lung cancer    Fibrocystic breast    History of kidney stones    h/o   Personal history of radiation therapy    Stress incontinence    Umbilical hernia 81/0175   Past Surgical History:  Procedure Laterality Date   BREAST BIOPSY Left 01/13/2021   [place] bx/ ribbon clip/dcis grade 1   BREAST LUMPECTOMY Left 01/25/2021   COLONOSCOPY N/A 11/18/2020   Procedure: COLONOSCOPY;  Surgeon: Virgel Manifold, MD;  Location:  [facility] ENDOSCOPY;  Service: Endoscopy;  Laterality: N/A;   FOOT SURGERY Right 07/04/2010   PART MASTECTOMY,RADIO FREQUENCY LOCALIZER,AXILLARY SENTINEL NODE BIOPSY Left 01/25/2021   Procedure: PART MASTECTOMY,RADIO FREQUENCY LOCALIZER,AXILLARY SENTINEL NODE BIOPSY;  Surgeon: Herbert Pun, MD;  Location: [facility] ORS;  Service: General;  Laterality: Left;   Patient Active Problem List   Diagnosis Date Noted   Encounter for hepatitis C screening test for low risk patient 03/25/2021   Annual physical exam 03/25/2021   Encounter for lipid screening for cardiovascular disease 03/25/2021   Stress and adjustment reaction 03/25/2021   Dizziness, nonspecific 03/25/2021   Malignant neoplasm of breast, stage 1, estrogen receptor positive, left ([place]) 01/22/2021    REFERRING DIAG: acute pain of left shoulder   THERAPY DIAG:  Left shoulder pain, unspecified chronicity  Stiffness of left shoulder, not elsewhere classified  Muscle weakness (generalized)  Other muscle spasm  Rationale for Evaluation and Treatment: Rehabilitation  ONSET DATE: L lumpectomy performed 01/25/2021   PERTINENT HISTORY: Patient is a 46 y.o. female who presents to outpatient physical therapy with a referral for medical diagnosis acute left shoulder pain. This patient's chief complaints consist of clicking, pain, stiffness, and dysfunction at left shoulder and upper quarter when using left UE leading to the following functional deficits: difficulty with activities  that require use of L UE especially activities that require use of L UE overhead such as working out, coaching, crossfit, loading groceries, lifting, pushing, pulling, kayaking, hobbies, etc. 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  Patient denies hx of stroke, seizures, lung problems, heart problems, diabetes, unexplained weight loss, unexplained changes in bowel or bladder problems, unexplained stumbling or dropping things, osteoporosis, and spinal surgery or shoulder surgery.   PRECAUTIONS: none  SUBJECTIVE: Patient reports she is feeling well and feels her ROM and tolerance for overhead motion is improving. She had an hour session with a massage therapist since last PT session who applied a hand held device that vibrated and had estim in it for about 25 min. She states she felt a lot of relief of tightness and the best she has felt since having this problem the next day. She is wondering if we have a device like this at PT. She has no pain upon arrival and felt okay after last PT session. She has been doing her HEP including supported scapular elevation overhead. She was able to do a snatch movement this week with a 15# bar loaded with 30#.   PAIN:  Are you having pain? no  OBJECTIVE  TODAY'S TREATMENT:  Therapeutic exercise: to centralize symptoms and improve ROM, strength, muscular endurance, and activity tolerance required for successful completion of functional activities.  - Upper body ergometer at level 6 encourage joint nutrition, warm tissue, induce analgesic effect of aerobic exercise, improve muscular strength and endurance,  and prepare for remainder of session. 1x5 min reversing direction every 1 min. - L pec minor stretch in doorway, 1x20 with 5 second holds - retest of L shoulder arc on wall from flexion overhead abduction to placement behind head (improved).  - education on lat stretches following dry needling.   Manual therapy: to reduce pain and tissue tension, improve range of motion, neuromodulation, in order to promote improved  ability to complete functional activities. SUPINE - STM to left subscapularis, latissimus dorsi, pec major and minor PRONE - STM to left levator scap, rhomboids, lower and middle trap.  - L  scapular mobilizations L with PT hand under left scapular angle and distracting from thorax as well as mobilizing into upward rotation and circumduction.   Modality: (unbilled) Dry needling performed to L periscapular muscles to decrease pain and spasms along patient's L shoulder and chest region with patient in prone and supine utilizing 2 dry needle(s) .61mm x 29mm with 2 sticks at left lat dorsi and 1 stick at left subscapularis and 3 needles .10mm x 42mm with 2 sticks at left pec major/minor and 1 stick at left levator scapula, and 1 stick at left lower trap (using shelf method). Patient educated about the risks and benefits from therapy and verbally consents to treatment.  Dry needling performed by Everlean Alstrom. Graylon Good PT, DPT who is certified in this technique.  Pt required multimodal cuing for proper technique and to facilitate improved neuromuscular control, strength, range of motion, and functional ability resulting in improved performance and form.   PATIENT EDUCATION: Education details: Exercise purpose/form. Self management techniques. Education on HEP.  Person educated: Patient Education method: Explanation, Demonstration, Tactile  cues, and Verbal cues Education comprehension: verbalized understanding, returned demonstration, verbal cues required, tactile cues required, and needs further education     HOME EXERCISE PROGRAM: - lat stretch - scaption - modified push up - scapular depression/elevation, lat pull - pec minor stretch   ASSESSMENT:   CLINICAL IMPRESSION: Patient tolerated treatment well and got good twitch response to needling at left subscapularis and deep ache at other needling locations. Patient demonstrates improving L shoulder girdle ROM and tolerance to loading and appears to  be progressing as hoped towards full return to Cross[employer] without limitations. She has not yet been able to participate in pull ups due to tightness and pain in the chest and periscapular region. Patient would benefit from continued management of limiting condition by skilled physical therapist to address remaining impairments and functional limitations to work towards stated goals and return to PLOF or maximal functional independence.   Patient is a 46 y.o. female referred to outpatient physical therapy with a medical diagnosis of acute pain of left shoulder  who presents with signs and symptoms consistent with left shoulder pain and dysfunction complicated by recent treatment for left breast cancer  with recurring seromas and infection of breast and surrounding tissue s/p radiation treatment. Patient presents with significant joint stiffness, ROM, muscle tension, pain, scar tissue dysfunction, tissue integrity,  motor control, muscle performance (strength/power/endurance), and activity tolerance impairments that are limiting ability to complete her usual activities that require use of L UE especially activities that require use of L UE overhead such as working out, coaching, crossfit, loading groceries, lifting, pushing, pulling, kayaking, hobbies, etc, without difficulty. Patient will benefit from skilled physical therapy intervention to address current body structure impairments and activity limitations to improve function and work towards goals set in current POC in order to return to prior level of function or maximal functional improvement.    OBJECTIVE IMPAIRMENTS decreased activity tolerance, decreased endurance, decreased knowledge of condition, decreased ROM, decreased strength, hypomobility, increased edema, increased fascial restrictions, impaired perceived functional ability, increased muscle spasms, impaired flexibility, impaired tone, impaired UE functional use, improper body mechanics, and pain.     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    REHAB POTENTIAL: Good   CLINICAL DECISION MAKING: Evolving/moderate complexity   EVALUATION COMPLEXITY: Moderate     GOALS: Goals reviewed with patient? No   SHORT TERM GOALS: Target date: 09/14/2021   Patient will be independent with initial home exercise program for self-management of symptoms. Baseline: Initial HEP provided at IE (08/31/21); Goal status: In-progress     LONG TERM GOALS: Target date: 11/23/2021   Patient will be independent with a long-term home exercise program for self-management of symptoms.  Baseline: Initial HEP provided at IE (08/31/21); Goal status: In-progress   2.  Patient will demonstrate improved  FOTO by equal or greater than 10 points to demonstrate improvement in overall condition and self-reported functional ability.  Baseline: to be measured visit 2 as appropriate (08/31/21); Goal status: In-progress   3.  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  Baseline: limited and painful - see objective exam (08/31/21); Goal status: In-progress   4.  Patient will demonstrate L shoulder MMT of equal or greater than 5/5 in all motions without increased pain to improve her ability to participate in and coach [employer], carry groceries, and complete her IADLs.  Baseline: painful and weak - see objective exam (08/31/21); Goal status: In-progress   5.  Patient will complete community, work and/or recreational activities without limitation due to current condition.  Baseline: difficulty with activities that require use of L UE especially activities that require use of L UE overhead such as working out, coaching, crossfit, loading groceries, lifting, pushing, pulling, kayaking, hobbies, etc (08/31/21); Goal status: In-progress   6.  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  Baseline: is unable to perform (08/31/2021);  Goal status: In-progress       PLAN: PT FREQUENCY: 1-2x/week   PT DURATION: 12 weeks   PLANNED INTERVENTIONS: Therapeutic exercises, Therapeutic activity, Neuromuscular re-education, Gait training, Patient/Family education, Joint mobilization, Aquatic Therapy, Dry Needling, Spinal mobilization, Cryotherapy, Moist heat, scar mobilization, Manual therapy, and Re-evaluation   PLAN FOR NEXT SESSION: update HEP, manual therapy, consider dry needling as appropriate.    Everlean Alstrom. Graylon Good, PT, DPT 10/13/21, 5:29 PM  [facility] [address] [place], [place] [address] P: [phone] I F: [phone]

## 2021-10-13 NOTE — Therapy (Signed)
[facility] [address], [place], [address] Phone: [phone]   Fax:  [phone]  Occupational Therapy Treatment  Patient Details  Name: Megan Cummings MRN: [id number] Date of Birth: 10-05-1975 Referring Provider (OT): Dr Windell Moment   Encounter Date: 10/13/2021   OT End of Session - 10/13/21 1721     Visit Number 15    Number of Visits 18    Date for OT Re-Evaluation 11/23/21    OT Start Time 1533    OT Stop Time 1605    OT Time Calculation (min) 32 min    Activity Tolerance Patient tolerated treatment well    Behavior During Therapy [facility] for tasks assessed/performed             Past Medical History:  Diagnosis Date   Basal cell carcinoma 09/21/2015   Left anterior shoulder. Superficial and nodular patterns.   Basal cell carcinoma 09/28/2015   Left spinal upper back. Superficial.   Basal cell carcinoma 12/06/2016   Right forearm. Superficial.    Breast cancer ([place])    Left   Complication of anesthesia    woke up during colonoscopy   Dysplastic nevus 12/06/2016   Right spinal mid back. Severe atypia and halo nevus effect, margin involved. Excised: 02/13/2017. Margins free   Dysplastic nevus 07/04/2017   Right upper back. Moderate atypia, close to margin.   Family history of colon cancer    Family history of lung cancer    Fibrocystic breast    History of kidney stones    h/o   Personal history of radiation therapy    Stress incontinence    Umbilical hernia 02/5725    Past Surgical History:  Procedure Laterality Date   BREAST BIOPSY Left 01/13/2021   [place] bx/ ribbon clip/dcis grade 1   BREAST LUMPECTOMY Left 01/25/2021   COLONOSCOPY N/A 11/18/2020   Procedure: COLONOSCOPY;  Surgeon: Virgel Manifold, MD;  Location: [facility] END[facility];  Service: Endoscopy;  Laterality: N/A;   FOOT SURGERY Right 07/04/2010   PART MASTECTOMY,RADIO FREQUENCY LOCALIZER,AXILLARY SENTINEL NODE BIOPSY  Left 01/25/2021   Procedure: PART MASTECTOMY,RADIO FREQUENCY LOCALIZER,AXILLARY SENTINEL NODE BIOPSY;  Surgeon: Herbert Pun, MD;  Location: [facility] ORS;  Service: General;  Laterality: Left;    There were no vitals filed for this visit.   Subjective Assessment - 10/13/21 1720     Subjective  I done the taping mostly every day since that you last time.  I do not know what is better you need to tell me.  I see it every day.  But my motion and strength is coming along I feel better.    Pertinent History Ms. Geng is a 46 y.o.female patient who comes for follow up her left breast mastitis.    Patient with left mastitis with seroma. We have been doing serial aspirations with slowly improvement. Today there is no significant fluid to aspirate. The erythema is also improving. The pain is also improving. She still has significant stiffness and pain on the lateral aspect of the left breast that extends to the left axillary area  - last visit 07/08/21 and follow up again surgeon tomorrow    Patient Stated Goals I want my motion and pain better so I can do cross fit    Currently in Pain? No/denies                 LYMPHEDEMA/ONCOLOGY QUESTIONNAIRE - 10/13/21 0001       Right  Upper Extremity Lymphedema   15 cm Proximal to Olecranon Process 30 cm    10 cm Proximal to Olecranon Process 28.4 cm    Olecranon Process 25 cm      Left Upper Extremity Lymphedema   15 cm Proximal to Olecranon Process 30 cm    10 cm Proximal to Olecranon Process 28.7 cm    Olecranon Process 25 cm                Patient working out more in Media planner.  Reassess bilateral upper extremity circumference.  Left compared to the right within normal limits this date but we will continue to monitor patient denies any thoracic lymphedema.  Patient was educated on signs symptoms and prevention before.   Patient return to OT last week for assessing if any cording present.  As well as scar still adhere, keloid and  hypertrophic.  From 6-9 o'clock on left breast under the nipple Patient's tenderness from 12:00 to 6:00 improved greatly on left breast.   Patient did not show any lymphatic cording    scar assessed patient still keloid in indented from 6:00 to 9:00.  Scar mobilization was done using Coban for increased traction with great success on medial and bottom of scar.   Patient's intention of scar is smaller than it was 10 days ago.   Appear kinesiotaping is working well with patient and tolerating well.   Change kinesiotaping to be done to 12:00 and 1:00 with 100% pull upwards as well as 7:00 to open deeper scar below nipple.   Patient was educated in application and patient to do for 3 weeks together with scar massage return for follow-up.    Upon assessment application of kinesiotape with range of motion showed mobilization and patient able to tolerate well.                OT Education - 10/13/21 1721     Education Details changes in HEP , scar tissue massage/taping    Person(s) Educated Patient    Methods Explanation;Demonstration;Tactile cues;Verbal cues;Handout    Comprehension Returned demonstration;Verbal cues required              OT Short Term Goals - 08/31/21 1035       OT SHORT TERM GOAL #1   Title Patient to be independent in the home program doing scar tissue massage, compression for left breast to decrease pain less than 2/10 with range of motion touch    Baseline Unable to tolerate any soft tissue or palpation on L lateral breast superior breast into axilla.  5-7/10 pain with abduction of L shoulder to 90 and scar massage or soft tissue on L breast and pec  NOW no pain wiht AROM for shoulder but pull over superior breast, axilla and scapula- scar tissue mobs still tender but no pain with ROM    Status Achieved               OT Long Term Goals - 08/31/21 1036       OT LONG TERM GOAL #1   Title Pain in L arm decrease to less than 3/10 to do pull over  shirt and do hair    Baseline pain 8/10 in L arm at 80 degrees  - slight pull 1-2/10 and AROM WNL  NOW AROM WFL can pull shirt off pain free - but tight    Status Achieved      OT LONG TERM GOAL #2   Title Cording in  L UE decrease for pt to show WNL L UE AROM to return to cross fit or ADL's/IADL"S    Baseline Cording in axilla - pain 8/10 to 80 degrees to ABD and Flexion 108 degrees L UE- NOW AROM WNL slight pull but less than 1-2/10 - cannot do all crossfit exercises NOW refer to PT for over head strengthening -pt report click - but AROM WFL and pain free -pull or stretch    Status Achieved      OT LONG TERM GOAL #3   Title Pt to be independent in HEP to increase L UE AROM without increase symptoms    Baseline increase symptoms - pull over chest , axilla and scapula    Status Achieved      OT LONG TERM GOAL #4   Title Monitor need for compression for L upper quadrant to decrease lymphedema or circumference in L UE    Baseline Patient to have a preventative sleeve but did not need to wear it the last few weeks.  Measurements within normal limits compared to the right this date.  Continue to monitor as she increases her strengthening    Time 12    Period Weeks    Status On-going    Target Date 11/23/21      OT LONG TERM GOAL #5   Title Left shoulder active range of motion increased to within normal limits with pain less than a 2 and left breast    Baseline Active range of motion within functional limits pain-free, but do feel or hear a click in shoulder for overhead strengthening no pull over scar tissue stiffness in the morning mostly over superior breast, axilla and scapula.  Patient referred to PT for shoulder strengthening.    Status Deferred                   Plan - 10/13/21 1721     Clinical Impression Statement Pt present at OT eval  end of last year with diagnosis L partial mastectomy 01/25/21 - Pt had cording in L UE and pain with AROM  improved greastly  Pt was seen  in January with radiation nearly done , had great ROM and was back to do some of her exercieses with ligth crossfit and was going to start yoga. Had to hold off on manual and chipbag because of skin irriation from radiation. Pt  did not return at that time after 3-4 wks as recommended.  Patient developed end of March cellulitis/mastitis in left breast need to aspirations and was referred back to OT.  Patient was seen for scar massage range of motion that improved greatly.  Patient returned last week for continue scar adhesion below left nipple.  At that time change HEP for scar massage as well as change kinesiotaping for scar mobilization during the day.  Scar still adhere, keloid and hypertrophic but this date indented area was smaller and able to do more scar massage with Coban.  Reeducation of Kinesiotape application was done patient to do it for another 3 weeks as well as scar massage and will follow-up with me .  Left upper extremity circumference within normal limits compared to previous measurements.  Reinforced with patient again to do scar mobilization morning and evening before getting up with arm over head.  Pt cont to be limited by increase scar adhesion tissue  and fibrosis- limiting her Ind in ADL's and IADl's. Pt can cont to benefit from cont  OT services.  OT Occupational Profile and History Problem Focused Assessment - Including review of records relating to presenting problem    Occupational performance deficits (Please refer to evaluation for details): ADL's;IADL's;Play;Leisure;Social Participation    Body Structure / Function / Physical Skills ADL;Decreased knowledge of precautions;Flexibility;Scar mobility;ROM;Edema;Pain    Rehab Potential Good    Clinical Decision Making Limited treatment options, no task modification necessary    Comorbidities Affecting Occupational Performance: None    Modification or Assistance to Complete Evaluation  No modification of tasks or assist necessary to  complete eval    OT Frequency Biweekly    OT Duration 6 weeks    OT Treatment/Interventions Self-care/ADL training;Manual lymph drainage;Contrast Bath;Cryotherapy;Manual Therapy;Patient/family education;Passive range of motion;Therapeutic exercise;Scar mobilization    Consulted and Agree with Plan of Care Patient             Patient will benefit from skilled therapeutic intervention in order to improve the following deficits and impairments:   Body Structure / Function / Physical Skills: ADL, Decreased knowledge of precautions, Flexibility, Scar mobility, ROM, Edema, Pain       Visit Diagnosis: Scar tissue    Problem List Patient Active Problem List   Diagnosis Date Noted   Encounter for hepatitis C screening test for low risk patient 03/25/2021   Annual physical exam 03/25/2021   Encounter for lipid screening for cardiovascular disease 03/25/2021   Stress and adjustment reaction 03/25/2021   Dizziness, nonspecific 03/25/2021   Malignant neoplasm of breast, stage 1, estrogen receptor positive, left ([place]) 01/22/2021    Rosalyn Gess, OTR/L,CLT 10/13/2021, 5:25 PM  [facility] [address], [place], [address] Phone: [phone]   Fax:  [phone]  Name: Megan Cummings MRN: [id number] Date of Birth: 1975-08-20

## 2021-10-15 ENCOUNTER — Encounter: Payer: Self-pay | Admitting: Radiation Oncology

## 2021-10-15 ENCOUNTER — Ambulatory Visit
Admission: RE | Admit: 2021-10-15 | Discharge: 2021-10-15 | Disposition: A | Discharge status: Home / Self Care | Payer: BC Managed Care – PPO | Source: Ambulatory Visit | Attending: Radiation Oncology | Admitting: Radiation Oncology

## 2021-10-15 VITALS — BP 145/82 | HR 62 | Temp 97.2°F | Resp 16 | Ht 69.0 in | Wt 160.0 lb

## 2021-10-15 DIAGNOSIS — C50312 Malignant neoplasm of lower-inner quadrant of left female breast: Secondary | ICD-10-CM | POA: Diagnosis not present

## 2021-10-15 DIAGNOSIS — Z170 Estrogen receptor positive status [ER+]: Secondary | ICD-10-CM | POA: Diagnosis not present

## 2021-10-15 DIAGNOSIS — C50912 Malignant neoplasm of unspecified site of left female breast: Secondary | ICD-10-CM

## 2021-10-15 DIAGNOSIS — Z08 Encounter for follow-up examination after completed treatment for malignant neoplasm: Secondary | ICD-10-CM | POA: Diagnosis not present

## 2021-10-15 DIAGNOSIS — Z923 Personal history of irradiation: Secondary | ICD-10-CM | POA: Diagnosis not present

## 2021-10-15 DIAGNOSIS — Z79811 Long term (current) use of aromatase inhibitors: Secondary | ICD-10-CM | POA: Insufficient documentation

## 2021-10-15 NOTE — Progress Notes (Signed)
Radiation Oncology Follow up Note  Name: Megan Cummings   Date:   10/15/2021 MRN:  [id number] DOB: 05-Aug-1975    This 46 y.o. 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  REFERRING PROVIDER: PGwyneth Sprout FNP  HPI: Patient is a 46 year old female now out 6 months having completed whole breast radiation to her left breast for stage Ia ER positive invasive mammary carcinoma.  Seen today in routine follow-up she is doing well.  She specifically denies breast tenderness cough or bone pain..  She had mammograms and ultrasound back in April which I have reviewed were BI-RADS 2 benign.  She is currently on Arimidex tolerating it well without side effect.  COMPLICATIONS OF TREATMENT: none  FOLLOW UP COMPLIANCE: keeps appointments   PHYSICAL EXAM:  BP (!) 145/82 (BP Location: Right Arm, Patient Position: Sitting, Cuff Size: Normal)   Pulse 62   Temp (!) 97.2 F (36.2 C) (Tympanic)   Resp 16   Ht '5\' 9"'$  (1.753 m) Comment: stated HT  Wt 160 lb (72.6 kg)   BMI 23.63 kg/m  Patient's left breast is distorted secondary to surgical technique.  With retraction towards the nipple areolar complex.  No dominant masses noted in either breast.  No axillary or supraclavicular adenopathy is identified.  Well-developed well-nourished patient in NAD. HEENT reveals PERLA, EOMI, discs not visualized.  Oral cavity is clear. No oral mucosal lesions are identified. Neck is clear without evidence of cervical or supraclavicular adenopathy. Lungs are clear to A&P. Cardiac examination is essentially unremarkable with regular rate and rhythm without murmur rub or thrill. Abdomen is benign with no organomegaly or masses noted. Motor sensory and DTR levels are equal and symmetric in the upper and lower extremities. Cranial nerves II through XII are grossly intact. Proprioception is intact.  No peripheral adenopathy or edema is identified. No motor or sensory levels are noted. Crude visual fields are within normal range.  RADIOLOGY RESULTS: Mammograms ultrasound reviewed compatible with above-stated findings  PLAN: Present time patient is doing well with no evidence of disease 6 months out.  Of asked to see her back in 6 months for follow-up.  Patient knows to call with any concerns.  I would like to take this opportunity to thank you for allowing me to participate in the care of your patient..Noreene Filbert MD

## 2021-10-18 ENCOUNTER — Encounter: Payer: Self-pay | Admitting: Physical Therapy

## 2021-10-18 ENCOUNTER — Ambulatory Visit: Payer: BC Managed Care – PPO | Admitting: Physical Therapy

## 2021-10-18 DIAGNOSIS — M62838 Other muscle spasm: Secondary | ICD-10-CM

## 2021-10-18 DIAGNOSIS — M6281 Muscle weakness (generalized): Secondary | ICD-10-CM

## 2021-10-18 DIAGNOSIS — M25612 Stiffness of left shoulder, not elsewhere classified: Secondary | ICD-10-CM | POA: Diagnosis not present

## 2021-10-18 DIAGNOSIS — M25512 Pain in left shoulder: Secondary | ICD-10-CM | POA: Diagnosis not present

## 2021-10-18 DIAGNOSIS — L905 Scar conditions and fibrosis of skin: Secondary | ICD-10-CM | POA: Diagnosis not present

## 2021-10-18 NOTE — Therapy (Signed)
OUTPATIENT PHYSICAL THERAPY TREATMENT NOTE   Patient Name: Megan Cummings MRN: [id number] DOB:1976-01-28, 46 y.o., female Today's Date: 10/18/21   PCP: Herbert Pun, MD REFERRING PROVIDER: Herbert Pun, MD  END OF SESSION:   PT End of Session - 10/18/21 1347     Visit Number 6    Number of Visits 24    Date for PT Re-Evaluation 11/23/21    Authorization Type [employer] reporting period from 08/31/2021    Authorization Time Period VL 30 per cal yr    Authorization - Visit Number 6    Authorization - Number of Visits 30    Progress Note Due on Visit 10    PT Start Time 1347    PT Stop Time 1425    PT Time Calculation (min) 38 min    Activity Tolerance Patient tolerated treatment well;Patient limited by pain    Behavior During Therapy [facility] for tasks assessed/performed                 Past Medical History:  Diagnosis Date   Basal cell carcinoma 09/21/2015   Left anterior shoulder. Superficial and nodular patterns.   Basal cell carcinoma 09/28/2015   Left spinal upper back. Superficial.   Basal cell carcinoma 12/06/2016   Right forearm. Superficial.    Breast cancer ([place])    Left   Complication of anesthesia    woke up during colonoscopy   Dysplastic nevus 12/06/2016   Right spinal mid back. Severe atypia and halo nevus effect, margin involved. Excised: 02/13/2017. Margins free   Dysplastic nevus 07/04/2017   Right upper back. Moderate atypia, close to margin.   Family history of colon cancer    Family history of lung cancer    Fibrocystic breast    History of kidney stones    h/o   Personal history of radiation therapy    Stress incontinence    Umbilical hernia [id number]/7858   Past Surgical History:  Procedure Laterality Date   BREAST BIOPSY Left 01/13/2021   [place] bx/ ribbon clip/dcis grade 1   BREAST LUMPECTOMY Left 01/25/2021   COLONOSCOPY N/A 11/18/2020   Procedure: COLONOSCOPY;  Surgeon: Virgel Manifold, MD;  Location:  [facility] ENDOSCOPY;  Service: Endoscopy;  Laterality: N/A;   FOOT SURGERY Right 07/04/2010   PART MASTECTOMY,RADIO FREQUENCY LOCALIZER,AXILLARY SENTINEL NODE BIOPSY Left 01/25/2021   Procedure: PART MASTECTOMY,RADIO FREQUENCY LOCALIZER,AXILLARY SENTINEL NODE BIOPSY;  Surgeon: Herbert Pun, MD;  Location: [facility] ORS;  Service: General;  Laterality: Left;   Patient Active Problem List   Diagnosis Date Noted   Encounter for hepatitis C screening test for low risk patient 03/25/2021   Annual physical exam 03/25/2021   Encounter for lipid screening for cardiovascular disease 03/25/2021   Stress and adjustment reaction 03/25/2021   Dizziness, nonspecific 03/25/2021   Malignant neoplasm of breast, stage 1, estrogen receptor positive, left ([place]) 01/22/2021    REFERRING DIAG: acute pain of left shoulder   THERAPY DIAG:  Left shoulder pain, unspecified chronicity  Stiffness of left shoulder, not elsewhere classified  Muscle weakness (generalized)  Other muscle spasm  Rationale for Evaluation and Treatment: Rehabilitation  ONSET DATE: L lumpectomy performed 01/25/2021   PERTINENT HISTORY: Patient is a 46 y.o. female who presents to outpatient physical therapy with a referral for medical diagnosis acute left shoulder pain. This patient's chief complaints consist of clicking, pain, stiffness, and dysfunction at left shoulder and upper quarter when using left UE leading to the following functional deficits: difficulty with  activities that require use of L UE especially activities that require use of L UE overhead such as working out, coaching, crossfit, loading groceries, lifting, pushing, pulling, kayaking, hobbies, etc. 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  Patient denies hx of stroke, seizures, lung problems, heart problems, diabetes, unexplained weight loss, unexplained changes in bowel or bladder problems, unexplained stumbling or dropping things, osteoporosis, and spinal surgery or shoulder surgery.   PRECAUTIONS: none  SUBJECTIVE: Patient reports she is feeling well. She states that she golfed 18 holes on Saturday and yesterday she was extremely sore just inferior to her left breast. She states she also suddenly noticed swelling at her right axilla region (not the side with breast cancer) when she put on her sports bra and shirt and it was bulging out there. Her husband identified it as enlarged when she asked him if he noticed anything off without her pointing to the area. She states she has also started a daily workout with increased number of reps of shoulder exercises and gets clicking in both superior shoulders with overhead press, worse as she gets tired. She states she continues to get a lot of relief of tightness and soreness after going to massage therapy where they are using [employer] device. She states she can tell no difference from dry needling after last PT session.   PAIN:  Are you having pain? Not at rest  OBJECTIVE   Right Upper Extremity Lymphedema    15 cm Proximal to Olecranon Process 30 cm     10 cm Proximal to Olecranon Process 28.8 cm          Left Upper Extremity Lymphedema    15 cm Proximal to Olecranon Process 31 cm     10 cm Proximal to Olecranon Process 28.8 cm    Chest circumference just under axilla with breath: 90.5 cm  **Circumference measurements taken by Rosalyn Gess, OT who is treating patient for lymphedema. UE measurements originally taken today by PT but was taken again by OT due to likely interference of intra-rater reliability  errors between the two clinicians.   TODAY'S TREATMENT:  Therapeutic exercise: to centralize symptoms and improve ROM, strength, muscular endurance, and activity tolerance required for successful completion of functional activities.  - education on load management and pain monitoring in regards to clicking shoulders.  - practiced several iterations of trunk rotation including sidelying open book on both sides, crossed sitting trunk rotation with arms assisting crossing over body and behind body both sides, and golf swing with driver to see if it reproduces pain inferior to breast. Only golf swing did.  - seated hang at lat bar, 65#, noted intense pulling in scar tissue near L breast and left pec. Completed ~ 5 partial pull downs in this position and ~ 5 scapular pulls.  - seated scapular depression at lat bar, 1x10 at 45# - seated lat pull, 3x10 at 45#.  - problem solving and trial of different positions to replicate ways patient could increase load on scapular elevation with arms overhead without lat bar: settled on tall kneeling with back to lat pull with bar fixed where she could reach and adding load in a controlled  manner by gradually flexing knees. 1x10.  - standing L shoulder flexion stretch at TRX, instructed in 1x10 with 5 second hold  Pt required multimodal cuing for proper technique and to facilitate improved neuromuscular control, strength, range of motion, and functional ability resulting in improved performance and form.   PATIENT EDUCATION: Education details: Exercise purpose/form. Self management techniques. Education on HEP.  Person educated: Patient Education method: Explanation, Demonstration, Tactile cues, and Verbal cues Education comprehension: verbalized understanding, returned demonstration, verbal cues required, tactile cues required, and needs further education     HOME EXERCISE PROGRAM: - lat stretch - scaption - modified push up - scapular depression/elevation,  lat pull - pec minor stretch   ASSESSMENT:   CLINICAL IMPRESSION: Patient tolerated treatment well with no increase in pain by end of session but felt discouraged that she could still strongly reproduce pulling in her left chest region with adding more load to lat bar. B UE measurement did not show significant change from last week in the non-radiated side (R side) and baseline chest circumference measurement taken at axillary level. Pain after golfing was likely from starting new activity that affected left sided scar tissue and tight muscles. Unclear why patient experienced swelling at R axillary region and it appears to be better compared to what she experienced at first. Patient educated on ways to progress load for hanging position. Patient would benefit from continued management of limiting condition by skilled physical therapist to address remaining impairments and functional limitations to work towards stated goals and return to PLOF or maximal functional independence.   Patient is a 46 y.o. female referred to outpatient physical therapy with a medical diagnosis of acute pain of left shoulder  who presents with signs and symptoms consistent with left shoulder pain and dysfunction complicated by recent treatment for left breast cancer  with recurring seromas and infection of breast and surrounding tissue s/p radiation treatment. Patient presents with significant joint stiffness, ROM, muscle tension, pain, scar tissue dysfunction, tissue integrity,  motor control, muscle performance (strength/power/endurance), and activity tolerance impairments that are limiting ability to complete her usual activities that require use of L UE especially activities that require use of L UE overhead such as working out, coaching, crossfit, loading groceries, lifting, pushing, pulling, kayaking, hobbies, etc, without difficulty. Patient will benefit from skilled physical therapy intervention to address current body  structure impairments and activity limitations to improve function and work towards goals set in current POC in order to return to prior level of function or maximal functional improvement.    OBJECTIVE IMPAIRMENTS decreased activity tolerance, decreased endurance, decreased knowledge of condition, decreased ROM, decreased strength, hypomobility, increased edema, increased fascial restrictions, impaired perceived functional ability, increased muscle spasms, impaired flexibility, impaired tone, impaired UE functional use, improper body mechanics, and pain.    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    REHAB POTENTIAL: Good   CLINICAL DECISION MAKING: Evolving/moderate complexity   EVALUATION COMPLEXITY: Moderate     GOALS: Goals reviewed with patient? No   SHORT TERM GOALS: Target date: 09/14/2021   Patient will be independent with initial home exercise program for self-management of symptoms. Baseline: Initial HEP provided at IE (08/31/21); Goal status: In-progress     LONG TERM GOALS: Target date:  11/23/2021   Patient will be independent with a long-term home exercise program for self-management of symptoms.  Baseline: Initial HEP provided at IE (08/31/21); Goal status: In-progress   2.  Patient will demonstrate improved FOTO by equal or greater than 10 points to demonstrate improvement in overall condition and self-reported functional ability.  Baseline: to be measured visit 2 as appropriate (08/31/21); Goal status: In-progress   3.  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  Baseline: limited and painful - see objective exam (08/31/21); Goal status: In-progress   4.  Patient will demonstrate L shoulder MMT of equal or greater than 5/5 in all motions without increased pain to improve her ability to participate in and coach [employer], carry groceries, and complete her IADLs.  Baseline: painful and weak - see objective exam (08/31/21); Goal status: In-progress   5.  Patient will complete community, work and/or recreational activities without limitation due to current condition.  Baseline: difficulty with activities that require use of L UE especially activities that require use of L UE overhead such as working out, coaching, crossfit, loading groceries, lifting, pushing, pulling, kayaking, hobbies, etc (08/31/21); Goal status: In-progress   6.  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  Baseline: is unable to perform (08/31/2021);  Goal status: In-progress       PLAN: PT FREQUENCY: 1-2x/week   PT DURATION: 12 weeks   PLANNED INTERVENTIONS: Therapeutic exercises,  Therapeutic activity, Neuromuscular re-education, Gait training, Patient/Family education, Joint mobilization, Aquatic Therapy, Dry Needling, Spinal mobilization, Cryotherapy, Moist heat, scar mobilization, Manual therapy, and Re-evaluation   PLAN FOR NEXT SESSION: update HEP, manual therapy, consider dry needling as appropriate.    Everlean Alstrom. Graylon Good, PT, DPT 10/18/21, 3:55 PM  [facility] [address] [place],  [address] P: [phone] I F: [phone]

## 2021-10-26 ENCOUNTER — Inpatient Hospital Stay (HOSPITAL_BASED_OUTPATIENT_CLINIC_OR_DEPARTMENT_OTHER): Payer: BC Managed Care – PPO | Admitting: Oncology

## 2021-10-26 ENCOUNTER — Inpatient Hospital Stay: Payer: BC Managed Care – PPO | Attending: Oncology

## 2021-10-26 ENCOUNTER — Encounter: Payer: Self-pay | Admitting: Oncology

## 2021-10-26 ENCOUNTER — Other Ambulatory Visit: Payer: Self-pay | Admitting: *Deleted

## 2021-10-26 ENCOUNTER — Inpatient Hospital Stay: Payer: BC Managed Care – PPO

## 2021-10-26 VITALS — BP 154/86 | HR 66 | Temp 97.0°F | Resp 16 | Wt 162.4 lb

## 2021-10-26 DIAGNOSIS — Z170 Estrogen receptor positive status [ER+]: Secondary | ICD-10-CM | POA: Diagnosis not present

## 2021-10-26 DIAGNOSIS — Z5111 Encounter for antineoplastic chemotherapy: Secondary | ICD-10-CM | POA: Diagnosis not present

## 2021-10-26 DIAGNOSIS — C50912 Malignant neoplasm of unspecified site of left female breast: Secondary | ICD-10-CM | POA: Diagnosis not present

## 2021-10-26 DIAGNOSIS — Z853 Personal history of malignant neoplasm of breast: Secondary | ICD-10-CM

## 2021-10-26 DIAGNOSIS — Z79811 Long term (current) use of aromatase inhibitors: Secondary | ICD-10-CM | POA: Diagnosis not present

## 2021-10-26 DIAGNOSIS — Z08 Encounter for follow-up examination after completed treatment for malignant neoplasm: Secondary | ICD-10-CM

## 2021-10-26 DIAGNOSIS — Z5181 Encounter for therapeutic drug level monitoring: Secondary | ICD-10-CM | POA: Diagnosis not present

## 2021-10-26 DIAGNOSIS — E2839 Other primary ovarian failure: Secondary | ICD-10-CM

## 2021-10-26 LAB — COMPREHENSIVE METABOLIC PANEL
ALT: 19 U/L (ref 0–44)
AST: 23 U/L (ref 15–41)
Albumin: 4 g/dL (ref 3.5–5.0)
Alkaline Phosphatase: 59 U/L (ref 38–126)
Anion gap: 3 — ABNORMAL LOW (ref 5–15)
BUN: 15 mg/dL (ref 6–20)
CO2: 29 mmol/L (ref 22–32)
Calcium: 8.9 mg/dL (ref 8.9–10.3)
Chloride: 105 mmol/L (ref 98–111)
Creatinine, Ser: 0.96 mg/dL (ref 0.44–1.00)
GFR, Estimated: >60 mL/min (ref >60)
Glucose, Bld: 136 mg/dL — ABNORMAL HIGH (ref 70–99)
Potassium: 3.6 mmol/L (ref 3.5–5.1)
Sodium: 137 mmol/L (ref 135–145)
Total Bilirubin: 0.6 mg/dL (ref 0.3–1.2)
Total Protein: 7.2 g/dL (ref 6.5–8.1)

## 2021-10-26 MED ORDER — GOSERELIN ACETATE 3.6 MG ~~LOC~~ IMPL
3.6000 mg | DRUG_IMPLANT | SUBCUTANEOUS | Status: AC
  Administered 2021-10-26: 3.6 mg via SUBCUTANEOUS
  Filled 2021-10-26: qty 3.6

## 2021-10-26 NOTE — Progress Notes (Signed)
  Pt states she has been expereicning hip and joint pain; and has done research and seen where magnesium might help with aches. Will like to discuss if that is okay or if there is another medication she can take that will eliminate side effects.

## 2021-10-26 NOTE — Progress Notes (Signed)
Hematology/Oncology Consult note [facility]  Telephone:([phone] [phone]  Patient Care Team: Jacky Kindle, FNP as PCP - General (Family Medicine) Carmina Miller, MD as Consulting Physician (Radiation Oncology) Creig Hines, MD as Consulting Physician (Oncology) Carolan Shiver, MD as Consulting Physician (General Surgery)   Name of the patient: Megan Cummings  [id number]  01/07/1976   Date of visit: 10/26/21  Diagnosis-  pathological prognostic stage Ia invasive mammary carcinoma right breast ER/PR positive HER2 negative    Chief complaint/ Reason for visit-routine follow-up of breast cancer on Zoladex and Arimidex  Heme/Onc history: Patient is a 46 year old premenopausal female who recently underwent a diagnostic bilateral mammogram after she complained of pain in her left breast.  Mammogram showed innumerable cysts in the left breast the largest of which was 1.2 x 1.1 x 1.4 cm.  Targeted ultrasound of the inner left breast showed a hypoechoic mass measuring 1 x 0.8 x 1.9 cm.  No definite lymphadenopathy noted in the left axilla.  No suspicious findings in the right breast.  She has had her last mammogram in 2019 when was again noted to have bilateral breast cysts and subsequent ultrasounds showed benign cysts with no suspicious lesions.Ultrasound-guided biopsy of the left breast mass showed invasive mammary carcinoma 6 mm grade 1 ER 81 to 90% positive PR 91 200% positive and HER2 equivocal by IHC and FISH testing negative   Menarche at the age of 46.  She is G1, P1 L1.  No use of birth control.  No prior hysterectomy.  She is still premenopausal and gets her menstrual cycles regularly.  Family history significant for colon cancer in paternal grandmother and lung cancer or lymphoma in maternal grandfather.   Final lumpectomy pathology showed 13 mm grade 1 invasive mammary carcinoma with negative margins.  3 sentinel lymph nodes negative  for malignancy.  No lymphovascular invasion.  Oncotype score came back at 16And chemotherapy benefit for this score in patients less than 95 years of age would be ~1.6%.  Adjuvant chemotherapy was not recommended.  Patient completed adjuvant radiation treatment.  Patient is on ovarian suppression with Zoladex plus Arimidex started in April 2023.  Interval history-patient reports pain in her bilateral knees with the start of Arimidex.  She has started taking magnesium supplements for the same which she states helps her to some extent.  ECOG PS- 0 Pain scale- 3  Review of systems- Review of Systems  Constitutional:  Negative for chills, fever, malaise/fatigue and weight loss.  HENT:  Negative for congestion, ear discharge and nosebleeds.   Eyes:  Negative for blurred vision.  Respiratory:  Negative for cough, hemoptysis, sputum production, shortness of breath and wheezing.   Cardiovascular:  Negative for chest pain, palpitations, orthopnea and claudication.  Gastrointestinal:  Negative for abdominal pain, blood in stool, constipation, diarrhea, heartburn, melena, nausea and vomiting.  Genitourinary:  Negative for dysuria, flank pain, frequency, hematuria and urgency.  Musculoskeletal:  Positive for joint pain. Negative for back pain and myalgias.  Skin:  Negative for rash.  Neurological:  Negative for dizziness, tingling, focal weakness, seizures, weakness and headaches.  Endo/Heme/Allergies:  Does not bruise/bleed easily.  Psychiatric/Behavioral:  Negative for depression and suicidal ideas. The patient does not have insomnia.       Allergies  Allergen Reactions   Sulfa Antibiotics Rash     Past Medical History:  Diagnosis Date   Basal cell carcinoma 09/21/2015   Left anterior shoulder. Superficial and nodular patterns.  Basal cell carcinoma 09/28/2015   Left spinal upper back. Superficial.   Basal cell carcinoma 12/06/2016   Right forearm. Superficial.    Breast cancer ([place])     Left   Complication of anesthesia    woke up during colonoscopy   Dysplastic nevus 12/06/2016   Right spinal mid back. Severe atypia and halo nevus effect, margin involved. Excised: 02/13/2017. Margins free   Dysplastic nevus 07/04/2017   Right upper back. Moderate atypia, close to margin.   Family history of colon cancer    Family history of lung cancer    Fibrocystic breast    History of kidney stones    h/o   Personal history of radiation therapy    Stress incontinence    Umbilical hernia 58/3094     Past Surgical History:  Procedure Laterality Date   BREAST BIOPSY Left 01/13/2021   [place] bx/ ribbon clip/dcis grade 1   BREAST LUMPECTOMY Left 01/25/2021   COLONOSCOPY N/A 11/18/2020   Procedure: COLONOSCOPY;  Surgeon: Virgel Manifold, MD;  Location: [facility] ENDOSCOPY;  Service: Endoscopy;  Laterality: N/A;   FOOT SURGERY Right 07/04/2010   PART MASTECTOMY,RADIO FREQUENCY LOCALIZER,AXILLARY SENTINEL NODE BIOPSY Left 01/25/2021   Procedure: PART MASTECTOMY,RADIO FREQUENCY LOCALIZER,AXILLARY SENTINEL NODE BIOPSY;  Surgeon: Herbert Pun, MD;  Location: [facility] ORS;  Service: General;  Laterality: Left;    Social History   Socioeconomic History   Marital status: Married    Spouse name: Not on file   Number of children: Not on file   Years of education: Not on file   Highest education level: Not on file  Occupational History   Not on file  Tobacco Use   Smoking status: Former    Packs/day: 0.50    Years: 10.00    Total pack years: 5.00    Types: Cigarettes    Quit date: 2005    Years since quitting: 18.5   Smokeless tobacco: Never  Vaping Use   Vaping Use: Never used  Substance and Sexual Activity   Alcohol use: Yes    Comment: Occasionally drinks wine   Drug use: No   Sexual activity: Yes    Birth control/protection: None  Other Topics Concern   Not on file  Social History Narrative   Not on file   Social Determinants of Health   Financial Resource  Strain: Not on file  Food Insecurity: Not on file  Transportation Needs: Not on file  Physical Activity: Not on file  Stress: Not on file  Social Connections: Not on file  Intimate Partner Violence: Not on file    Family History  Problem Relation Age of Onset   Healthy Mother    Hyperlipidemia Father    Healthy Brother    Lung cancer Maternal Uncle    Healthy Maternal Grandmother    Emphysema Maternal Grandfather    Colon cancer Paternal Grandmother    Lung cancer Paternal Grandfather    Healthy Son    Breast cancer Neg Hx      Current Outpatient Medications:    anastrozole (ARIMIDEX) 1 MG tablet, Take 1 tablet (1 mg total) by mouth daily. Pt to start after having 3 doses of goserelin inj. Pt's knows when to start, Disp: 30 tablet, Rfl: 3   Calcium Carb-Cholecalciferol (CALCIUM 600 + D PO), Take 1 Dose by mouth daily. Vitamin d is 255mcg, Disp: , Rfl:    HYDROcodone-acetaminophen (NORCO/VICODIN) 5-325 MG tablet, Take by mouth., Disp: , Rfl:    KRILL OIL PO,  Take 1,000 mg by mouth daily., Disp: , Rfl:    LORazepam (ATIVAN) 0.5 MG tablet, TAKE 1 TABLET(0.5 MG) BY MOUTH TWICE DAILY AS NEEDED FOR ANXIETY, Disp: 30 tablet, Rfl: 0   magnesium 30 MG tablet, Take 30 mg by mouth 2 (two) times daily., Disp: , Rfl:    meloxicam (MOBIC) 15 MG tablet, Take 15 mg by mouth daily as needed., Disp: , Rfl:  No current facility-administered medications for this visit.  Facility-Administered Medications Ordered in Other Visits:    goserelin (ZOLADEX) injection 3.6 mg, 3.6 mg, Subcutaneous, Q28 days, Sindy Guadeloupe, MD, 3.6 mg at 07/27/21 1343   goserelin (ZOLADEX) injection 3.6 mg, 3.6 mg, Subcutaneous, Q28 days, Sindy Guadeloupe, MD, 3.6 mg at 10/26/21 1453  Physical exam:  Vitals:   10/26/21 1356  BP: (!) 154/86  Pulse: 66  Resp: 16  Temp: (!) 97 F (36.1 C)  SpO2: 100%  Weight: 162 lb 6.4 oz (73.7 kg)   Physical Exam Constitutional:      General: She is not in acute  distress. Cardiovascular:     Rate and Rhythm: Normal rate and regular rhythm.     Heart sounds: Normal heart sounds.  Pulmonary:     Effort: Pulmonary effort is normal.     Breath sounds: Normal breath sounds.  Skin:    General: Skin is warm and dry.  Neurological:     Mental Status: She is alert and oriented to person, place, and time.    Breast exam was performed in seated and lying down position. Patient is status post left lumpectomy with a well-healed surgical scar. No evidence of any palpable masses. No evidence of axillary adenopathy. No evidence of any palpable masses or lumps in the right breast. No evidence of right axillary adenopathy      Latest Ref Rng & Units 10/26/2021    1:41 PM  CMP  Glucose 70 - 99 mg/dL 136   BUN 6 - 20 mg/dL 15   Creatinine 0.44 - 1.00 mg/dL 0.96   Sodium 135 - 145 mmol/L 137   Potassium 3.5 - 5.1 mmol/L 3.6   Chloride 98 - 111 mmol/L 105   CO2 22 - 32 mmol/L 29   Calcium 8.9 - 10.3 mg/dL 8.9   Total Protein 6.5 - 8.1 g/dL 7.2   Total Bilirubin 0.3 - 1.2 mg/dL 0.6   Alkaline Phos 38 - 126 U/L 59   AST 15 - 41 U/L 23   ALT 0 - 44 U/L 19       Latest Ref Rng & Units 06/29/2021    1:39 PM  CBC  WBC 4.0 - 10.5 K/uL 6.3   Hemoglobin 12.0 - 15.0 g/dL 13.8   Hematocrit 36.0 - 46.0 % 42.2   Platelets 150 - 400 K/uL 433      Assessment and plan- Patient is a 46 y.o. female  with 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  He is here for routine follow-up of breast cancer and for Zoladex  Patient will receive her next dose of Zoladex today and from next month onwards I will switch her to a 57-month dose of Lupron.  Patient will also continue taking Arimidex.  Suspect arthralgias secondary to Arimidex.  Currently self-limited.  Patient has been taking magnesium supplements and states that it helps her with the symptoms.  Continue to monitor  Patient will be due for a diagnostic  mammogram in October 2023  which will be coordinated by Dr. Peyton Najjar.  I will see her back in 6 months no labs   Visit Diagnosis 1. Suppression of ovarian secretion   2. Visit for monitoring Arimidex therapy   3. Encounter for follow-up surveillance of breast cancer      Dr. Randa Evens, MD, MPH [facility] [phone] 10/26/2021 3:42 PM

## 2021-10-27 ENCOUNTER — Ambulatory Visit: Payer: BC Managed Care – PPO | Admitting: Physical Therapy

## 2021-10-28 ENCOUNTER — Telehealth: Payer: Self-pay | Admitting: Physical Therapy

## 2021-10-28 NOTE — Telephone Encounter (Signed)
Called patient after she did not show up for her PT appointment scheduled yesterday at 2:30pm. No answer and VM full.  Everlean Alstrom. Graylon Good, PT, DPT 10/28/21, 7:37 PM  [facility] [address] [place], [place] [address] P: [phone] I F: [phone]

## 2021-11-02 ENCOUNTER — Ambulatory Visit: Payer: BC Managed Care – PPO | Admitting: Physical Therapy

## 2021-11-04 ENCOUNTER — Ambulatory Visit: Payer: BC Managed Care – PPO | Admitting: Physical Therapy

## 2021-11-15 ENCOUNTER — Other Ambulatory Visit: Payer: Self-pay | Admitting: Oncology

## 2021-11-23 ENCOUNTER — Ambulatory Visit: Payer: BC Managed Care – PPO

## 2021-11-29 ENCOUNTER — Inpatient Hospital Stay: Payer: BC Managed Care – PPO | Attending: Oncology

## 2021-11-29 ENCOUNTER — Other Ambulatory Visit: Payer: Self-pay | Admitting: Oncology

## 2021-11-29 DIAGNOSIS — Z170 Estrogen receptor positive status [ER+]: Secondary | ICD-10-CM | POA: Insufficient documentation

## 2021-11-29 DIAGNOSIS — C50912 Malignant neoplasm of unspecified site of left female breast: Secondary | ICD-10-CM | POA: Diagnosis not present

## 2021-11-29 MED ORDER — LEUPROLIDE ACETATE 3.75 MG IM KIT
11.2500 mg | PACK | INTRAMUSCULAR | Status: DC
  Administered 2021-11-29: 11.25 mg via INTRAMUSCULAR
  Filled 2021-11-29: qty 11.25

## 2021-12-10 ENCOUNTER — Other Ambulatory Visit: Payer: Self-pay | Admitting: General Surgery

## 2021-12-10 DIAGNOSIS — Z853 Personal history of malignant neoplasm of breast: Secondary | ICD-10-CM

## 2021-12-13 ENCOUNTER — Encounter: Payer: Self-pay | Admitting: Oncology

## 2021-12-13 ENCOUNTER — Other Ambulatory Visit: Payer: Self-pay | Admitting: Oncology

## 2022-01-10 ENCOUNTER — Ambulatory Visit
Admission: RE | Admit: 2022-01-10 | Discharge: 2022-01-10 | Disposition: A | Discharge status: Home / Self Care | Payer: BC Managed Care – PPO | Source: Ambulatory Visit | Attending: General Surgery | Admitting: General Surgery

## 2022-01-10 DIAGNOSIS — Z853 Personal history of malignant neoplasm of breast: Secondary | ICD-10-CM

## 2022-01-10 DIAGNOSIS — N6011 Diffuse cystic mastopathy of right breast: Secondary | ICD-10-CM | POA: Diagnosis not present

## 2022-01-10 DIAGNOSIS — R922 Inconclusive mammogram: Secondary | ICD-10-CM | POA: Diagnosis not present

## 2022-01-27 NOTE — Progress Notes (Signed)
[facility] New Patient Evaluation and Consultation  Referring Provider: Mora Bellman, MD PCP: Gwyneth Sprout, FNP Date of Service: 01/28/2022  SUBJECTIVE Chief Complaint: New Patient (Initial Visit) HUE STEVESON is a 46 y.o. female here for a consult for prolapse. Pt also has a cyst on the urethra)  History of Present Illness: LEVY WELLMAN is a 46 y.o. White or Caucasian female seen in consultation at the request of Dr. Elly Modena for evaluation of prolapse.    Review of records significant for: Has noticed a mass in her vagina, is physically active. She was noted to have uterine prolapse on exam.   Urinary Symptoms: Leaks urine with cough/ sneeze, laughing, exercise, and lifting.  Leaks a varied amount, usually when she is more active Pad use: Reports intermittent use of liners/minipads.  She is bothered by her UI symptoms. No prior treatment for leakage.  She reports that she had a urethral cyst. Has been present for many years and is irritating. Has not had any imaging previously.   Day time voids 4-5.  Nocturia: 1-2 times per night to void. Voiding dysfunction: she does not empty her bladder well.  does not use a catheter to empty bladder.  When urinating, she feels dribbling after finishing and the need to urinate multiple times in a row Drinks: Fit aid, decaf coffee, sweet tea, 1 gallon water  UTIs: Reports 10+ UTI's- has symptoms of urgency, starts drinking a lot of water and that improves symptoms.  Denies history of blood in urine, kidney or bladder stones, pyelonephritis, bladder cancer, and kidney cancer  Pelvic Organ Prolapse Symptoms:                  She Admits to a feeling of a bulge the vaginal area.  She is not able to see a bulge.  It was bothersome previously, but has not been bothering her since she had seen Dr Elly Modena.   Bowel Symptom: Bowel movements: 1 time(s) per day Stool consistency: soft  Straining: no.  Splinting: no.   Incomplete evacuation: no.  She Denies accidental bowel leakage / fecal incontinence Bowel regimen: none Last colonoscopy: Date 2022, Results normal  Sexual Function Sexually active: yes.  Sexual orientation: Straight Pain with sex: Yes  Reports pain at the vaginal opening and deep in the pelvis.  Sometimes feels something hard at the opening of the vagina anteriorly and her partner can feel it was well.   Pelvic Pain Denies pelvic pain   Past Medical History:  Past Medical History:  Diagnosis Date   Basal cell carcinoma 09/21/2015   Left anterior shoulder. Superficial and nodular patterns.   Basal cell carcinoma 09/28/2015   Left spinal upper back. Superficial.   Basal cell carcinoma 12/06/2016   Right forearm. Superficial.    Breast cancer ([place])    Left   Complication of anesthesia    woke up during colonoscopy   Dysplastic nevus 12/06/2016   Right spinal mid back. Severe atypia and halo nevus effect, margin involved. Excised: 02/13/2017. Margins free   Dysplastic nevus 07/04/2017   Right upper back. Moderate atypia, close to margin.   Family history of colon cancer    Family history of lung cancer    Fibrocystic breast    History of kidney stones    h/o   Personal history of radiation therapy    Stress incontinence    Umbilical hernia 42/7062     Past Surgical History:   Past Surgical History:  Procedure Laterality Date   BREAST BIOPSY Left 01/13/2021   [place] bx/ ribbon clip/dcis grade 1   BREAST LUMPECTOMY Left 01/25/2021   COLONOSCOPY N/A 11/18/2020   Procedure: COLONOSCOPY;  Surgeon: Virgel Manifold, MD;  Location: [facility] ENDOSCOPY;  Service: Endoscopy;  Laterality: N/A;   FOOT SURGERY Right 07/04/2010   PART MASTECTOMY,RADIO FREQUENCY LOCALIZER,AXILLARY SENTINEL NODE BIOPSY Left 01/25/2021   Procedure: PART MASTECTOMY,RADIO FREQUENCY LOCALIZER,AXILLARY SENTINEL NODE BIOPSY;  Surgeon: Herbert Pun, MD;  Location: [facility] ORS;  Service: General;   Laterality: Left;     Past OB/GYN History: OB History  Gravida Para Term Preterm AB Living  '1 1 1     1  '$ SAB IAB Ectopic Multiple Live Births          1    # Outcome Date GA Lbr Len/2nd Weight Sex Delivery Anes PTL Lv  1 Term 2010     Vag-Spont       Menopausal: Yes Contraception: None Last pap smear was 12/16/2020.  Any history of abnormal pap smears: no.   Medications: She has a current medication list which includes the following prescription(s): anastrozole, calcium carb-cholecalciferol, hydrocodone-acetaminophen, krill oil, lorazepam, magnesium, and meloxicam, and the following Facility-Administered Medications: goserelin and goserelin.   Allergies: Patient is allergic to sulfa antibiotics.   Social History:  Social History   Tobacco Use   Smoking status: Former    Packs/day: 0.50    Years: 10.00    Total pack years: 5.00    Types: Cigarettes    Quit date: 2005    Years since quitting: 18.8   Smokeless tobacco: Never  Vaping Use   Vaping Use: Never used  Substance Use Topics   Alcohol use: Yes    Comment: Occasionally drinks wine   Drug use: No    Relationship status: married She lives with spouse.   She is employed as an Optometrist. Regular exercise: Yes: daily exercise - cross fit, can lift very heavy (deadlifts abt 210 lbs) History of abuse: No  Family History:   Family History  Problem Relation Age of Onset   Healthy Mother    Hyperlipidemia Father    Healthy Brother    Lung cancer Maternal Uncle    Healthy Maternal Grandmother    Emphysema Maternal Grandfather    Colon cancer Paternal Grandmother    Lung cancer Paternal Grandfather    Healthy Son    Breast cancer Neg Hx      Review of Systems: Review of Systems  Constitutional:  Negative for fever, malaise/fatigue and weight loss.  Respiratory:  Negative for cough, shortness of breath and wheezing.   Cardiovascular:  Negative for chest pain, palpitations and leg swelling.   Gastrointestinal:  Negative for abdominal pain and blood in stool.  Genitourinary:  Negative for dysuria.       + abnormal periods  Musculoskeletal:  Negative for myalgias.  Skin:  Negative for rash.  Neurological:  Negative for dizziness and headaches.  Endo/Heme/Allergies:  Bruises/bleeds easily.       + hot flashes  Psychiatric/Behavioral:  Negative for depression. The patient is not nervous/anxious.      OBJECTIVE Physical Exam: Vitals:   01/28/22 1401  BP: 134/84  Pulse: 81  Weight: 158 lb (71.7 kg)  Height: '5\' 9"'$  (1.753 m)    Physical Exam Constitutional:      General: She is not in acute distress. Pulmonary:     Effort: Pulmonary effort is normal.  Abdominal:     General: There  is no distension.     Palpations: Abdomen is soft.     Tenderness: There is no abdominal tenderness. There is no rebound.  Musculoskeletal:        General: No swelling. Normal range of motion.  Skin:    General: Skin is warm and dry.     Findings: No rash.  Neurological:     Mental Status: She is alert and oriented to person, place, and time.  Psychiatric:        Mood and Affect: Mood normal.        Behavior: Behavior normal.     GU / Detailed Urogynecologic Evaluation:  Pelvic Exam: Normal external female genitalia; Bartholin's and Skene's glands normal in appearance; urethral meatus appears normal but 1cm suburethral cyst present, when palpated feels gritty (small stones possibly present?)  CST: negative  Speculum exam reveals normal vaginal mucosa with atrophy. Cervix normal appearance. Uterus normal single, nontender. Adnexa no mass, fullness, tenderness.    Pelvic floor strength IV/V  Pelvic floor musculature: Right levator non-tender, Right obturator non-tender, Left levator non-tender, Left obturator non-tender  POP-Q:   POP-Q  -2.5                                            Aa   -2.5                                           Ba  -5                                               C   3                                            Gh  3                                            Pb  9                                            tvl   -2.5                                            Ap  -2.5                                            Bp  -8  D      Rectal Exam:  Normal external rectum  Post-Void Residual (PVR) by Bladder Scan: In order to evaluate bladder emptying, we discussed obtaining a postvoid residual and she agreed to this procedure.  Procedure: The ultrasound unit was placed on the patient's abdomen in the suprapubic region after the patient had voided. A PVR of 1 ml was obtained by bladder scan.  Laboratory Results: POC urine: negative   ASSESSMENT AND PLAN Ms. Hoheisel is a 46 y.o. with:  1. Suburethral cyst   2. Urinary frequency   3. Uterovaginal prolapse, incomplete   4. SUI (stress urinary incontinence, female)    Suburethral cyst - possible diverticulum, will obtain MRI of the pelvis to assess further.  - Discussed possible surgery for removal since it is bothersome to her. Complexity of surgery dependent on MRI findings. Will discuss further once we have results.   2. SUI - For treatment of stress urinary incontinence,  non-surgical options include expectant management, weight loss, physical therapy, as well as a pessary.  Surgical options include a midurethral sling, Burch urethropexy, and transurethral injection of a bulking agent. Will need simple CMG to demonstrate leakage.   3. Stage I anterior, Stage I posterior, Stage I apical prolapse - She has minimal prolapse and not currently bothering her.  - We discussed that symptoms may come and go and can potentially worsen with heavy lifting or straining.   Return 6 weeks  Jaquita Folds, MD   Medical Decision Making:  - Reviewed/ ordered a clinical laboratory test - Reviewed/ ordered a radiologic study - Review  and summation of prior records

## 2022-01-28 ENCOUNTER — Ambulatory Visit (INDEPENDENT_AMBULATORY_CARE_PROVIDER_SITE_OTHER): Payer: BC Managed Care – PPO | Admitting: Obstetrics and Gynecology

## 2022-01-28 ENCOUNTER — Encounter: Payer: Self-pay | Admitting: Obstetrics and Gynecology

## 2022-01-28 VITALS — BP 134/84 | HR 81 | Ht 69.0 in | Wt 158.0 lb

## 2022-01-28 DIAGNOSIS — N368 Other specified disorders of urethra: Secondary | ICD-10-CM

## 2022-01-28 DIAGNOSIS — N393 Stress incontinence (female) (male): Secondary | ICD-10-CM

## 2022-01-28 DIAGNOSIS — R350 Frequency of micturition: Secondary | ICD-10-CM

## 2022-01-28 DIAGNOSIS — N812 Incomplete uterovaginal prolapse: Secondary | ICD-10-CM | POA: Diagnosis not present

## 2022-01-28 LAB — POCT URINALYSIS DIPSTICK
Bilirubin, UA: NEGATIVE
Glucose, UA: NEGATIVE
Ketones, UA: 15
Leukocytes, UA: NEGATIVE
Nitrite, UA: NEGATIVE
Protein, UA: NEGATIVE
Spec Grav, UA: 1.02 (ref 1.010–1.025)
Urobilinogen, UA: 0.2 E.U./dL
pH, UA: 6 (ref 5.0–8.0)

## 2022-01-28 NOTE — Patient Instructions (Signed)
For treatment of stress urinary incontinence, which is leakage with physical activity/movement/strainging/coughing, we discussed expectant management versus nonsurgical options versus surgery. Nonsurgical options include weight loss, physical therapy, as well as a pessary.  Surgical options include a midurethral sling, which is a synthetic mesh sling that acts like a hammock under the urethra to prevent leakage of urine, a Burch urethropexy, and transurethral injection of a bulking agent.  You have a stage 1 (out of 4) prolapse.  We discussed the fact that it is not life threatening but there are several treatment options. For treatment of pelvic organ prolapse, we discussed options for management including expectant management, conservative management, and surgical management, such as Kegels, a pessary, pelvic floor physical therapy, and specific surgical procedures.

## 2022-02-28 ENCOUNTER — Encounter: Payer: Self-pay | Admitting: Obstetrics and Gynecology

## 2022-02-28 ENCOUNTER — Other Ambulatory Visit: Payer: Self-pay | Admitting: Oncology

## 2022-02-28 ENCOUNTER — Inpatient Hospital Stay: Payer: BC Managed Care – PPO

## 2022-02-28 MED ORDER — GOSERELIN ACETATE 10.8 MG ~~LOC~~ IMPL
10.8000 mg | DRUG_IMPLANT | SUBCUTANEOUS | Status: DC
  Filled 2022-02-28: qty 10.8

## 2022-03-03 ENCOUNTER — Inpatient Hospital Stay: Payer: BC Managed Care – PPO | Attending: Oncology

## 2022-03-03 DIAGNOSIS — C50912 Malignant neoplasm of unspecified site of left female breast: Secondary | ICD-10-CM | POA: Insufficient documentation

## 2022-03-03 DIAGNOSIS — Z5111 Encounter for antineoplastic chemotherapy: Secondary | ICD-10-CM | POA: Insufficient documentation

## 2022-03-03 DIAGNOSIS — Z170 Estrogen receptor positive status [ER+]: Secondary | ICD-10-CM | POA: Diagnosis not present

## 2022-03-03 MED ORDER — GOSERELIN ACETATE 10.8 MG ~~LOC~~ IMPL
10.8000 mg | DRUG_IMPLANT | SUBCUTANEOUS | Status: AC
  Administered 2022-03-03: 10.8 mg via SUBCUTANEOUS
  Filled 2022-03-03: qty 10.8

## 2022-03-03 NOTE — Progress Notes (Signed)
[employer] Approval for MRI Pelvis w/wo contrast Order ID: [id number] Approval through- 02/28/2022-03/29/2022- AUTHORIZED

## 2022-03-09 ENCOUNTER — Ambulatory Visit
Admission: RE | Admit: 2022-03-09 | Discharge: 2022-03-09 | Disposition: A | Discharge status: Home / Self Care | Payer: BC Managed Care – PPO | Source: Ambulatory Visit | Attending: Obstetrics and Gynecology | Admitting: Obstetrics and Gynecology

## 2022-03-09 DIAGNOSIS — N83201 Unspecified ovarian cyst, right side: Secondary | ICD-10-CM | POA: Diagnosis not present

## 2022-03-09 DIAGNOSIS — N368 Other specified disorders of urethra: Secondary | ICD-10-CM | POA: Insufficient documentation

## 2022-03-09 MED ORDER — GADOBUTROL 1 MMOL/ML IV SOLN
7.0000 mL | Freq: Once | INTRAVENOUS | Status: AC | PRN
  Administered 2022-03-09: 7 mL via INTRAVENOUS

## 2022-03-11 ENCOUNTER — Encounter: Payer: Self-pay | Admitting: Obstetrics and Gynecology

## 2022-03-11 ENCOUNTER — Ambulatory Visit (INDEPENDENT_AMBULATORY_CARE_PROVIDER_SITE_OTHER): Payer: BC Managed Care – PPO | Admitting: Obstetrics and Gynecology

## 2022-03-11 VITALS — BP 175/90 | HR 77

## 2022-03-11 DIAGNOSIS — N368 Other specified disorders of urethra: Secondary | ICD-10-CM | POA: Diagnosis not present

## 2022-03-11 NOTE — Progress Notes (Signed)
[facility] Return Visit  SUBJECTIVE  History of Present Illness: KATRIEL CUTSFORTH is a 46 y.o. female seen in follow-up for suburethral cyst and stress incontinence. She underwent MRI of the pelvis.   MRI pelvis:  12 x 7 mm focus of T1 low signal with peripheral enhancing rim at the level of the urethral meatus. Imaging features are not consistent with urethral diverticulum. This finding is not associated with the vaginal wall as typically seen for Bartholin's gland cyst. Cyst or abscess of the Skene's gland would be a consideration although Skene's gland cyst typically has increased T2 signal (which this lesion does not) and is slightly more lateral to the urethral meatus while this finding is essentially at the midline. As such, the finding is considered indeterminate.  Past Medical History: Patient  has a past medical history of Basal cell carcinoma (09/21/2015), Basal cell carcinoma (09/28/2015), Basal cell carcinoma (12/06/2016), Breast cancer ([place]), Complication of anesthesia, Dysplastic nevus (12/06/2016), Dysplastic nevus (07/04/2017), Family history of colon cancer, Family history of lung cancer, Fibrocystic breast, History of kidney stones, Personal history of radiation therapy, Stress incontinence, and Umbilical hernia (05/5425).   Past Surgical History: She  has a past surgical history that includes Foot surgery (Right, 07/04/2010); Colonoscopy (N/A, 11/18/2020); Breast biopsy (Left, 01/13/2021); Part mastectomy,radio frequency localizer,axillary sentinel node biopsy (Left, 01/25/2021); and Breast lumpectomy (Left, 01/25/2021).   Medications: She has a current medication list which includes the following prescription(s): anastrozole, calcium carb-cholecalciferol, hydrocodone-acetaminophen, krill oil, lorazepam, magnesium, and meloxicam, and the following Facility-Administered Medications: goserelin, goserelin, and goserelin.   Allergies: Patient is allergic to  sulfa antibiotics.   Social History: Patient  reports that she quit smoking about 18 years ago. Her smoking use included cigarettes. She has a 5.00 pack-year smoking history. She has never used smokeless tobacco. She reports current alcohol use. She reports that she does not use drugs.      OBJECTIVE     Physical Exam: Vitals:   03/11/22 1022 03/11/22 1023  BP: (!) 173/91 (!) 175/90  Pulse: 89 77   Gen: No apparent distress, A&O x 3.  Detailed Urogynecologic Evaluation:  Deferred. Prior exam showed:  POP-Q   -2.5                                            Aa   -2.5                                           Ba   -5                                              C    3                                            Gh   3  Pb   9                                            tvl    -2.5                                            Ap   -2.5                                            Bp   -8                                              D       Verbal consent was obtained to perform simple CMG procedure:   Prolapse was reduced using 2 large cotton swabs. Urethra was prepped with betadine and a 27F catheter was placed and bladder was drained completely. The bladder was then backfilled with sterile water by gravity.  First sensation: 118m First Desire: 2064mStrong Desire: 230 ml Capacity: 45066mough stress test was negative. Valsalva stress test was negative.  Performed in both sitting and standing positions. Catheter was replaced to drain bladder.   Interpretation: CMG showed normal sensation, and normal cystometric capacity. Findings negative for stress incontinence, negative for detrusor overactivity.     ASSESSMENT AND PLAN    Ms. McCIrion a 46 82o. with:  1. 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 Will plan for resection. If there is need for urethral reconstruction, then will need a catheter for 10 days post operatively. Otherwise, there may be a chance for a catheter for a few days.  - We discussed risks of bleeding, infection, damage to surrounding organs including bowel, bladder, blood vessels, ureters and nerves, need for further surgery, numbness and weakness at any body site, buttock pain, postoperative cognitive dysfunction, and the rarer risks of blood clot, heart attack, pneumonia, death. All her questions were answered and she verbalized understanding.   - She did not demonstrate leakage with simple CMG today. Recommended urodynamic testing. She prefers to proceed with cyst removal first then can do UDS if needed post operatively.   Request sent for surgery scheduling  MicJaquita FoldsD

## 2022-04-05 NOTE — Progress Notes (Signed)
I,Joseline E Rosas,acting as a scribe for Gwyneth Sprout, FNP.,have documented all relevant documentation on the behalf of Gwyneth Sprout, FNP,as directed by  Gwyneth Sprout, FNP while in the presence of Gwyneth Sprout, FNP.   Complete physical exam   Patient: Megan Cummings   DOB: Sep 22, 1975   47 y.o. Female  MRN: [id number] Visit Date: 04/06/2022  Today's healthcare provider: Gwyneth Sprout, FNP  Re Introduced to nurse practitioner role and practice setting.  All questions answered.  Discussed provider/patient relationship and expectations.  Chief Complaint  Patient presents with   Annual Exam   Subjective    Megan Cummings is a 47 y.o. female who presents today for a complete physical exam.  She reports consuming a general diet. Gym/ health club routine includes [employer]. She generally feels well. She reports sleeping well. She does have additional problems to discuss today.  HPI   Past Medical History:  Diagnosis Date   Basal cell carcinoma 09/21/2015   Left anterior shoulder. Superficial and nodular patterns.   Basal cell carcinoma 09/28/2015   Left spinal upper back. Superficial.   Basal cell carcinoma 12/06/2016   Right forearm. Superficial.    Breast cancer ([place])    Left   Complication of anesthesia    woke up during colonoscopy   Dysplastic nevus 12/06/2016   Right spinal mid back. Severe atypia and halo nevus effect, margin involved. Excised: 02/13/2017. Margins free   Dysplastic nevus 07/04/2017   Right upper back. Moderate atypia, close to margin.   Family history of colon cancer    Family history of lung cancer    Fibrocystic breast    History of kidney stones    h/o   Personal history of radiation therapy    Stress incontinence    Umbilical hernia 88/5027   Past Surgical History:  Procedure Laterality Date   BREAST BIOPSY Left 01/13/2021   [place] bx/ ribbon clip/dcis grade 1   BREAST LUMPECTOMY Left 01/25/2021   COLONOSCOPY N/A 11/18/2020    Procedure: COLONOSCOPY;  Surgeon: Virgel Manifold, MD;  Location: [facility] END[facility]COPY;  Service: Endoscopy;  Laterality: N/A;   FOOT SURGERY Right 07/04/2010   PART MASTECTOMY,RADIO FREQUENCY LOCALIZER,AXILLARY SENTINEL NODE BIOPSY Left 01/25/2021   Procedure: PART MASTECTOMY,RADIO FREQUENCY LOCALIZER,AXILLARY SENTINEL NODE BIOPSY;  Surgeon: Herbert Pun, MD;  Location: [facility] ORS;  Service: General;  Laterality: Left;   Social History   Socioeconomic History   Marital status: Married    Spouse name: Not on file   Number of children: Not on file   Years of education: Not on file   Highest education level: Not on file  Occupational History   Not on file  Tobacco Use   Smoking status: Former    Packs/day: 0.50    Years: 10.00    Total pack years: 5.00    Types: Cigarettes    Quit date: 2005    Years since quitting: 19.0   Smokeless tobacco: Never  Vaping Use   Vaping Use: Never used  Substance and Sexual Activity   Alcohol use: Yes    Comment: Occasionally drinks wine   Drug use: No   Sexual activity: Yes    Birth control/protection: None  Other Topics Concern   Not on file  Social History Narrative   Not on file   Social Determinants of Health   Financial Resource Strain: Not on file  Food Insecurity: Not on file  Transportation Needs: Not on file  Physical Activity: Not on file  Stress: Not on file  Social Connections: Not on file  Intimate Partner Violence: Not on file   Family Status  Relation Name Status   Mother  Alive   Father  Alive   Brother  Alive   Mat Uncle  Deceased   MGM  Deceased   MGF  Deceased       suicide   PGM  Deceased   PGF  Deceased   Son  Alive   Neg Hx  (Not Specified)   Family History  Problem Relation Age of Onset   Healthy Mother    Hyperlipidemia Father    Healthy Brother    Lung cancer Maternal Uncle    Healthy Maternal Grandmother    Emphysema Maternal Grandfather    Colon cancer Paternal Grandmother    Lung  cancer Paternal Grandfather    Healthy Son    Breast cancer Neg Hx    Allergies  Allergen Reactions   Sulfa Antibiotics Rash    Patient Care Team: Gwyneth Sprout, FNP as PCP - General (Family Medicine) Noreene Filbert, MD as Consulting Physician (Radiation Oncology) Sindy Guadeloupe, MD as Consulting Physician (Oncology) Herbert Pun, MD as Consulting Physician (General Surgery)   Medications: Outpatient Medications Prior to Visit  Medication Sig   anastrozole (ARIMIDEX) 1 MG tablet TAKE 1 TABLET(1 MG) BY MOUTH DAILY. START AFTER HAVING 3 DOSES OF GOSERELIN INJECT.   Calcium Carb-Cholecalciferol (CALCIUM 600 + D PO) Take 1 Dose by mouth daily. Vitamin d is 266mg   HYDROcodone-acetaminophen (NORCO/VICODIN) 5-325 MG tablet Take by mouth.   KRILL OIL PO Take 1,000 mg by mouth daily.   LORazepam (ATIVAN) 0.5 MG tablet TAKE 1 TABLET(0.5 MG) BY MOUTH TWICE DAILY AS NEEDED FOR ANXIETY   magnesium 30 MG tablet Take 30 mg by mouth 2 (two) times daily.   meloxicam (MOBIC) 15 MG tablet Take 15 mg by mouth daily as needed.   Facility-Administered Medications Prior to Visit  Medication Dose Route Frequency Provider   goserelin (ZOLADEX) injection 10.8 mg  10.8 mg Subcutaneous Q90 days RSindy Guadeloupe MD   goserelin (ZOLADEX) injection 3.6 mg  3.6 mg Subcutaneous Q28 days RSindy Guadeloupe MD   goserelin (ZOLADEX) injection 3.6 mg  3.6 mg Subcutaneous Q28 days RSindy Guadeloupe MD    Review of Systems  All other systems reviewed and are negative.   Last CBC Lab Results  Component Value Date   WBC 6.3 06/29/2021   HGB 13.8 06/29/2021   HCT 42.2 06/29/2021   MCV 91.9 06/29/2021   MCH 30.1 06/29/2021   RDW 11.8 06/29/2021   PLT 433 (H) 085/46/2703  Last metabolic panel Lab Results  Component Value Date   GLUCOSE 136 (H) 10/26/2021   NA 137 10/26/2021   K 3.6 10/26/2021   CL 105 10/26/2021   CO2 29 10/26/2021   BUN 15 10/26/2021   CREATININE 0.96 10/26/2021   GFRNONAA >60  10/26/2021   CALCIUM 8.9 10/26/2021   PROT 7.2 10/26/2021   ALBUMIN 4.0 10/26/2021   LABGLOB 2.0 06/20/2018   AGRATIO 2.3 (H) 06/20/2018   BILITOT 0.6 10/26/2021   ALKPHOS 59 10/26/2021   AST 23 10/26/2021   ALT 19 10/26/2021   ANIONGAP 3 (L) 10/26/2021   Last lipids Lab Results  Component Value Date   CHOL 200 (H) 03/30/2021   HDL 66 03/30/2021   LDLCALC 119 (H) 03/30/2021   TRIG 81 03/30/2021   CHOLHDL 3.0  03/30/2021   Last thyroid functions Lab Results  Component Value Date   TSH 0.978 09/04/2019     Objective    BP (!) 180/93 (BP Location: Left Arm, Patient Position: Sitting, Cuff Size: Normal)   Pulse (!) 59   Temp 98.5 F (36.9 C) (Oral)   Resp 16   Ht '5\' 9"'$  (1.753 m)   Wt 164 lb 9.6 oz (74.7 kg)   BMI 24.31 kg/m   BP Readings from Last 3 Encounters:  04/06/22 (!) 180/93  03/11/22 (!) 175/90  01/28/22 134/84   Wt Readings from Last 3 Encounters:  04/06/22 164 lb 9.6 oz (74.7 kg)  01/28/22 158 lb (71.7 kg)  10/26/21 162 lb 6.4 oz (73.7 kg)   SpO2 Readings from Last 3 Encounters:  10/26/21 100%  07/27/21 100%  03/25/21 100%   Physical Exam Vitals and nursing note reviewed.  Constitutional:      General: She is awake. She is not in acute distress.    Appearance: Normal appearance. She is well-developed, well-groomed and normal weight. She is not ill-appearing, toxic-appearing or diaphoretic.  HENT:     Head: Normocephalic and atraumatic.     Jaw: There is normal jaw occlusion. No trismus, tenderness, swelling or pain on movement.     Right Ear: Hearing, tympanic membrane, ear canal and external ear normal. There is no impacted cerumen.     Left Ear: Hearing, tympanic membrane, ear canal and external ear normal. There is no impacted cerumen.     Nose: Nose normal. No congestion or rhinorrhea.     Right Turbinates: Not enlarged, swollen or pale.     Left Turbinates: Not enlarged, swollen or pale.     Right Sinus: No maxillary sinus tenderness or  frontal sinus tenderness.     Left Sinus: No maxillary sinus tenderness or frontal sinus tenderness.     Mouth/Throat:     Lips: Pink.     Mouth: Mucous membranes are moist. No injury.     Tongue: No lesions.     Pharynx: Oropharynx is clear. Uvula midline. No pharyngeal swelling, oropharyngeal exudate, posterior oropharyngeal erythema or uvula swelling.     Tonsils: No tonsillar exudate or tonsillar abscesses.  Eyes:     General: Lids are normal. Lids are everted, no foreign bodies appreciated. Vision grossly intact. Gaze aligned appropriately. No allergic shiner or visual field deficit.       Right eye: No discharge.        Left eye: No discharge.     Extraocular Movements: Extraocular movements intact.     Conjunctiva/sclera: Conjunctivae normal.     Right eye: Right conjunctiva is not injected. No exudate.    Left eye: Left conjunctiva is not injected. No exudate.    Pupils: Pupils are equal, round, and reactive to light.  Neck:     Thyroid: No thyroid mass, thyromegaly or thyroid tenderness.     Vascular: No carotid bruit.     Trachea: Trachea normal.  Cardiovascular:     Rate and Rhythm: Normal rate and regular rhythm.     Pulses: Normal pulses.          Carotid pulses are 2+ on the right side and 2+ on the left side.      Radial pulses are 2+ on the right side and 2+ on the left side.       Dorsalis pedis pulses are 2+ on the right side and 2+ on the left side.  Posterior tibial pulses are 2+ on the right side and 2+ on the left side.     Heart sounds: Normal heart sounds, S1 normal and S2 normal. No murmur heard.    No friction rub. No gallop.  Pulmonary:     Effort: Pulmonary effort is normal. No respiratory distress.     Breath sounds: Normal breath sounds and air entry. No stridor. No wheezing, rhonchi or rales.  Chest:     Chest wall: No tenderness.  Abdominal:     General: Abdomen is flat. Bowel sounds are normal. There is no distension.     Palpations: Abdomen  is soft. There is no mass.     Tenderness: There is no abdominal tenderness. There is no right CVA tenderness, left CVA tenderness, guarding or rebound.     Hernia: No hernia is present.  Genitourinary:    Comments: Exam deferred; denies complaints Musculoskeletal:        General: No swelling, tenderness, deformity or signs of injury. Normal range of motion.     Cervical back: Full passive range of motion without pain, normal range of motion and neck supple. No edema, rigidity or tenderness. No muscular tenderness.     Right lower leg: No edema.     Left lower leg: No edema.  Lymphadenopathy:     Cervical: No cervical adenopathy.     Right cervical: No superficial, deep or posterior cervical adenopathy.    Left cervical: No superficial, deep or posterior cervical adenopathy.  Skin:    General: Skin is warm and dry.     Capillary Refill: Capillary refill takes less than 2 seconds.     Coloration: Skin is not jaundiced or pale.     Findings: No bruising, erythema, lesion or rash.     Comments: Notes possible changing nevi on back; encouraged to call for derm annual skin screen given hx of skin cancers   Neurological:     General: No focal deficit present.     Mental Status: She is alert and oriented to person, place, and time. Mental status is at baseline.     GCS: GCS eye subscore is 4. GCS verbal subscore is 5. GCS motor subscore is 6.     Sensory: Sensation is intact. No sensory deficit.     Motor: Motor function is intact. No weakness.     Coordination: Coordination is intact. Coordination normal.     Gait: Gait is intact. Gait normal.  Psychiatric:        Attention and Perception: Attention and perception normal.        Mood and Affect: Mood and affect normal.        Speech: Speech normal.        Behavior: Behavior normal. Behavior is cooperative.        Thought Content: Thought content normal.        Cognition and Memory: Cognition and memory normal.        Judgment: Judgment  normal.     Last depression screening scores    04/06/2022    9:21 AM 03/25/2021    3:09 PM 11/04/2020    3:39 PM  PHQ 2/9 Scores  PHQ - 2 Score 0 0 0  PHQ- 9 Score 0 3 0   Last fall risk screening     No data to display         Last Audit-C alcohol use screening    06/19/2018    1:25 PM  Alcohol Use Disorder  Test (AUDIT)  1. How often do you have a drink containing alcohol? 0  2. How many drinks containing alcohol do you have on a typical day when you are drinking? 0  3. How often do you have six or more drinks on one occasion? 0  AUDIT-C Score 0   A score of 3 or more in women, and 4 or more in men indicates increased risk for alcohol abuse, EXCEPT if all of the points are from question 1   No results found for any visits on 04/06/22.  Assessment & Plan    Routine Health Maintenance and Physical Exam  Exercise Activities and Dietary recommendations  Goals   None     Immunization History  Administered Date(s) Administered   Tdap 10/28/2010    Health Maintenance  Topic Date Due   DTaP/Tdap/Td (2 - Td or Tdap) 10/27/2020   COVID-19 Vaccine (1) 04/22/2022 (Originally 02/08/1981)   INFLUENZA VACCINE  07/03/2022 (Originally 11/02/2021)   MAMMOGRAM  01/11/2023   PAP SMEAR-Modifier  11/05/2023   COLONOSCOPY (Pts 45-59yr Insurance coverage will need to be confirmed)  11/19/2030   Hepatitis C Screening  Completed   HIV Screening  Completed   HPV VACCINES  Aged Out    Discussed health benefits of physical activity, and encouraged her to engage in regular exercise appropriate for her age and condition.  Problem List Items Addressed This Visit       Other   Annual physical exam - Primary    Currently seeking care for Stage 1A breast cancer of L breast under Dr RJanese BanksCyst removal surgery scheduled for next month by urogyn Pt notes added stress x9 months since her parents have been living with her family as they build a new home UTD on dental Due for vision Due for  derm UTD on colon UTD on PAP Things to do to keep yourself healthy  - Exercise at least 30-45 minutes a day, 3-4 days a week.  - Eat a low-fat diet with lots of fruits and vegetables, up to 7-9 servings per day.  - Seatbelts can save your life. Wear them always.  - Smoke detectors on every level of your home, check batteries every year.  - Eye Doctor - have an eye exam every 1-2 years  - Safe sex - if you may be exposed to STDs, use a condom.  - Alcohol -  If you drink, do it moderately, less than 2 drinks per day.  - [place] Choose someone to speak for you if you are not able.  - Depression is common in our stressful world.If you're feeling down or losing interest in things you normally enjoy, please come in for a visit.  - Violence - If anyone is threatening or hurting you, please call immediately.       Relevant Orders   CBC with Differential/Platelet   Comprehensive metabolic panel   Lipid panel   TSH   Elevated blood pressure reading in office without diagnosis of hypertension    CBC and TSH completed today in setting of CPE Recommend additional follow up with RJanese Banksregarding medication side effects from anastrozole following previous injections of goserelin       Relevant Orders   CBC with Differential/Platelet   TSH   Stress and adjustment reaction    Acute on chronic, previous use of PRN benzo to assist; pt did not request additional refills at this time Notes family has been living with her for  9 months; looking forward to her parents closing on their new home following completion of construction process       Return in about 1 year (around 04/07/2023) for annual examination.    Vonna Kotyk, FNP, have reviewed all documentation for this visit. The documentation on 04/06/22 for the exam, diagnosis, procedures, and orders are all accurate and complete.  Gwyneth Sprout, [place] [phone] (phone) [phone]  (fax)  [place]

## 2022-04-06 ENCOUNTER — Ambulatory Visit (INDEPENDENT_AMBULATORY_CARE_PROVIDER_SITE_OTHER): Payer: BC Managed Care – PPO | Admitting: Family Medicine

## 2022-04-06 ENCOUNTER — Encounter: Payer: Self-pay | Admitting: Family Medicine

## 2022-04-06 VITALS — BP 180/93 | HR 59 | Temp 98.5°F | Resp 16 | Ht 69.0 in | Wt 164.6 lb

## 2022-04-06 DIAGNOSIS — Z23 Encounter for immunization: Secondary | ICD-10-CM | POA: Diagnosis not present

## 2022-04-06 DIAGNOSIS — R030 Elevated blood-pressure reading, without diagnosis of hypertension: Secondary | ICD-10-CM

## 2022-04-06 DIAGNOSIS — Z0000 Encounter for general adult medical examination without abnormal findings: Secondary | ICD-10-CM | POA: Diagnosis not present

## 2022-04-06 DIAGNOSIS — F4329 Adjustment disorder with other symptoms: Secondary | ICD-10-CM

## 2022-04-06 NOTE — Assessment & Plan Note (Signed)
Acute on chronic, previous use of PRN benzo to assist; pt did not request additional refills at this time Notes family has been living with her for 9 months; looking forward to her parents closing on their new home following completion of construction process

## 2022-04-06 NOTE — Patient Instructions (Signed)
Recommend eye visit for routine screening Recommend follow up with dermatology for annual skin examination We will follow up on labs and reach out regarding medication side effects or additional testing.  See you next year! Daneil Dan FNP

## 2022-04-06 NOTE — Assessment & Plan Note (Signed)
Currently seeking care for Stage 1A breast cancer of L breast under Dr Janese Banks Cyst removal surgery scheduled for next month by urogyn Pt notes added stress x9 months since her parents have been living with her family as they build a new home UTD on dental Due for vision Due for derm UTD on colon UTD on PAP Things to do to keep yourself healthy  - Exercise at least 30-45 minutes a day, 3-4 days a week.  - Eat a low-fat diet with lots of fruits and vegetables, up to 7-9 servings per day.  - Seatbelts can save your life. Wear them always.  - Smoke detectors on every level of your home, check batteries every year.  - Eye Doctor - have an eye exam every 1-2 years  - Safe sex - if you may be exposed to STDs, use a condom.  - Alcohol -  If you drink, do it moderately, less than 2 drinks per day.  - [place]. Choose someone to speak for you if you are not able.  - Depression is common in our stressful world.If you're feeling down or losing interest in things you normally enjoy, please come in for a visit.  - Violence - If anyone is threatening or hurting you, please call immediately.

## 2022-04-06 NOTE — Assessment & Plan Note (Signed)
CBC and TSH completed today in setting of CPE Recommend additional follow up with Megan Cummings regarding medication side effects from anastrozole following previous injections of goserelin

## 2022-04-07 LAB — TSH: TSH: 1.2 u[IU]/mL (ref 0.450–4.500)

## 2022-04-07 LAB — COMPREHENSIVE METABOLIC PANEL
ALT: 16 IU/L (ref 0–32)
AST: 23 IU/L (ref 0–40)
Albumin/Globulin Ratio: 2.1 (ref 1.2–2.2)
Albumin: 4.6 g/dL (ref 3.9–4.9)
Alkaline Phosphatase: 72 IU/L (ref 44–121)
BUN/Creatinine Ratio: 12 (ref 9–23)
BUN: 11 mg/dL (ref 6–24)
Bilirubin Total: 0.8 mg/dL (ref 0.0–1.2)
CO2: 24 mmol/L (ref 20–29)
Calcium: 9.7 mg/dL (ref 8.7–10.2)
Chloride: 102 mmol/L (ref 96–106)
Creatinine, Ser: 0.9 mg/dL (ref 0.57–1.00)
Globulin, Total: 2.2 g/dL (ref 1.5–4.5)
Glucose: 96 mg/dL (ref 70–99)
Potassium: 4 mmol/L (ref 3.5–5.2)
Sodium: 142 mmol/L (ref 134–144)
Total Protein: 6.8 g/dL (ref 6.0–8.5)
eGFR: 79 mL/min/{1.73_m2} (ref >59)

## 2022-04-07 LAB — LIPID PANEL
Chol/HDL Ratio: 3.1 ratio (ref 0.0–4.4)
Cholesterol, Total: 218 mg/dL — ABNORMAL HIGH (ref 100–199)
HDL: 71 mg/dL (ref >39)
LDL Chol Calc (NIH): 133 mg/dL — ABNORMAL HIGH (ref 0–99)
Triglycerides: 81 mg/dL (ref 0–149)
VLDL Cholesterol Cal: 14 mg/dL (ref 5–40)

## 2022-04-07 LAB — CBC WITH DIFFERENTIAL/PLATELET
Basophils Absolute: 0.1 10*3/uL (ref 0.0–0.2)
Basos: 1 %
EOS (ABSOLUTE): 0.1 10*3/uL (ref 0.0–0.4)
Eos: 2 %
Hematocrit: 40.7 % (ref 34.0–46.6)
Hemoglobin: 13.4 g/dL (ref 11.1–15.9)
Immature Grans (Abs): 0 10*3/uL (ref 0.0–0.1)
Immature Granulocytes: 0 %
Lymphocytes Absolute: 1 10*3/uL (ref 0.7–3.1)
Lymphs: 18 %
MCH: 30.4 pg (ref 26.6–33.0)
MCHC: 32.9 g/dL (ref 31.5–35.7)
MCV: 92 fL (ref 79–97)
Monocytes Absolute: 0.5 10*3/uL (ref 0.1–0.9)
Monocytes: 10 %
Neutrophils Absolute: 3.8 10*3/uL (ref 1.4–7.0)
Neutrophils: 69 %
Platelets: 377 10*3/uL (ref 150–450)
RBC: 4.41 x10E6/uL (ref 3.77–5.28)
RDW: 12 % (ref 11.7–15.4)
WBC: 5.5 10*3/uL (ref 3.4–10.8)

## 2022-04-07 NOTE — Progress Notes (Signed)
Slight increase in cholesterol. I continue to recommend diet low in saturated fat and regular exercise - 30 min at least 5 times per week The 10-year ASCVD risk score (Arnett DK, et al., 2019) is: 1.6%   Values used to calculate the score:     Age: 47 years     Sex: Female     Is Non-Hispanic African American: No     Diabetic: No     Tobacco smoker: No     Systolic Blood Pressure: 861 mmHg     Is BP treated: No     HDL Cholesterol: 71 mg/dL     Total Cholesterol: 218 mg/dL Other labs remain stable. I will reach out to [facility] and team regarding BP elevation.

## 2022-04-08 ENCOUNTER — Encounter: Payer: Self-pay | Admitting: Oncology

## 2022-04-13 ENCOUNTER — Encounter: Payer: Self-pay | Admitting: Obstetrics and Gynecology

## 2022-04-13 ENCOUNTER — Ambulatory Visit (INDEPENDENT_AMBULATORY_CARE_PROVIDER_SITE_OTHER): Payer: BC Managed Care – PPO | Admitting: Obstetrics and Gynecology

## 2022-04-13 VITALS — BP 149/90 | HR 58 | Wt 161.0 lb

## 2022-04-13 DIAGNOSIS — Z01818 Encounter for other preprocedural examination: Secondary | ICD-10-CM | POA: Diagnosis not present

## 2022-04-13 MED ORDER — IBUPROFEN 600 MG PO TABS: 600.0000 mg | ORAL_TABLET | Freq: Four times a day (QID) | ORAL | 0 refills | Status: DC | PRN

## 2022-04-13 MED ORDER — OXYCODONE HCL 5 MG PO TABS: 5.0000 mg | ORAL_TABLET | ORAL | 0 refills | Status: DC | PRN

## 2022-04-13 MED ORDER — ACETAMINOPHEN 500 MG PO TABS: 500.0000 mg | ORAL_TABLET | Freq: Four times a day (QID) | ORAL | 0 refills | Status: DC | PRN

## 2022-04-13 NOTE — Progress Notes (Unsigned)
[facility] Pre-Operative visit  Subjective Chief Complaint: Megan Cummings presents for a preoperative encounter.   History of Present Illness: Megan Cummings is a 47 y.o. female who presents for preoperative visit.  She is scheduled to undergo Excision of suburethral cyst, cystoscopy on 05/09/22.    MRI pelvis:  12 x 7 mm focus of T1 low signal with peripheral enhancing rim at the level of the urethral meatus. Imaging features are not consistent with urethral diverticulum. This finding is not associated with the vaginal wall as typically seen for Bartholin's gland cyst. Cyst or abscess of the Skene's gland would be a consideration although Skene's gland cyst typically has increased T2 signal (which this lesion does not) and is slightly more lateral to the urethral meatus while this finding is essentially at the midline. As such, the finding is considered indeterminate.  Past Medical History:  Diagnosis Date   Basal cell carcinoma 09/21/2015   Left anterior shoulder. Superficial and nodular patterns.   Basal cell carcinoma 09/28/2015   Left spinal upper back. Superficial.   Basal cell carcinoma 12/06/2016   Right forearm. Superficial.    Breast cancer ([place])    Left   Complication of anesthesia    woke up during colonoscopy   Dysplastic nevus 12/06/2016   Right spinal mid back. Severe atypia and halo nevus effect, margin involved. Excised: 02/13/2017. Margins free   Dysplastic nevus 07/04/2017   Right upper back. Moderate atypia, close to margin.   Family history of colon cancer    Family history of lung cancer    Fibrocystic breast    History of kidney stones    h/o   Personal history of radiation therapy    Stress incontinence    Umbilical hernia 40/3474     Past Surgical History:  Procedure Laterality Date   BREAST BIOPSY Left 01/13/2021   [place] bx/ ribbon clip/dcis grade 1   BREAST LUMPECTOMY Left 01/25/2021   COLONOSCOPY N/A 11/18/2020    Procedure: COLONOSCOPY;  Surgeon: Virgel Manifold, MD;  Location: [facility] END[facility]COPY;  Service: Endoscopy;  Laterality: N/A;   FOOT SURGERY Right 07/04/2010   PART MASTECTOMY,RADIO FREQUENCY LOCALIZER,AXILLARY SENTINEL NODE BIOPSY Left 01/25/2021   Procedure: PART MASTECTOMY,RADIO FREQUENCY LOCALIZER,AXILLARY SENTINEL NODE BIOPSY;  Surgeon: Herbert Pun, MD;  Location: [facility] ORS;  Service: General;  Laterality: Left;    is allergic to sulfa antibiotics.   Family History  Problem Relation Age of Onset   Healthy Mother    Hyperlipidemia Father    Healthy Brother    Lung cancer Maternal Uncle    Healthy Maternal Grandmother    Emphysema Maternal Grandfather    Colon cancer Paternal Grandmother    Lung cancer Paternal Grandfather    Healthy Son    Breast cancer Neg Hx     Social History   Tobacco Use   Smoking status: Former    Packs/day: 0.50    Years: 10.00    Total pack years: 5.00    Types: Cigarettes    Quit date: 2005    Years since quitting: 19.0   Smokeless tobacco: Never  Vaping Use   Vaping Use: Never used  Substance Use Topics   Alcohol use: Yes    Comment: Occasionally drinks wine   Drug use: No     Review of Systems was negative for a full 10 system review except as noted in the History of Present Illness.   Current Outpatient Medications:    acetaminophen (TYLENOL) 500 MG  tablet, Take 1 tablet (500 mg total) by mouth every 6 (six) hours as needed (pain)., Disp: 30 tablet, Rfl: 0   Calcium Carb-Cholecalciferol (CALCIUM 600 + D PO), Take 1 Dose by mouth daily. Vitamin d is 211mg, Disp: , Rfl:    HYDROcodone-acetaminophen (NORCO/VICODIN) 5-325 MG tablet, Take by mouth., Disp: , Rfl:    ibuprofen (ADVIL) 600 MG tablet, Take 1 tablet (600 mg total) by mouth every 6 (six) hours as needed., Disp: 30 tablet, Rfl: 0   KRILL OIL PO, Take 1,000 mg by mouth daily., Disp: , Rfl:    LORazepam (ATIVAN) 0.5 MG tablet, TAKE 1 TABLET(0.5 MG) BY MOUTH TWICE DAILY  AS NEEDED FOR ANXIETY, Disp: 30 tablet, Rfl: 0   magnesium 30 MG tablet, Take 30 mg by mouth 2 (two) times daily., Disp: , Rfl:    meloxicam (MOBIC) 15 MG tablet, Take 15 mg by mouth daily as needed., Disp: , Rfl:    oxyCODONE (OXY IR/ROXICODONE) 5 MG immediate release tablet, Take 1 tablet (5 mg total) by mouth every 4 (four) hours as needed for severe pain., Disp: 5 tablet, Rfl: 0   anastrozole (ARIMIDEX) 1 MG tablet, TAKE 1 TABLET(1 MG) BY MOUTH DAILY. START AFTER HAVING 3 DOSES OF GOSERELIN INJECT. (Patient not taking: Reported on 04/13/2022), Disp: 90 tablet, Rfl: 1 No current facility-administered medications for this visit.  Facility-Administered Medications Ordered in Other Visits:    goserelin (ZOLADEX) injection 10.8 mg, 10.8 mg, Subcutaneous, Q90 days, RSindy Guadeloupe MD, 10.8 mg at 03/03/22 1146   goserelin (ZOLADEX) injection 3.6 mg, 3.6 mg, Subcutaneous, Q28 days, RSindy Guadeloupe MD, 3.6 mg at 07/27/21 1343   goserelin (ZOLADEX) injection 3.6 mg, 3.6 mg, Subcutaneous, Q28 days, RSindy Guadeloupe MD, 3.6 mg at 10/26/21 1453   Objective Vitals:   04/13/22 1541 04/13/22 1611  BP: (!) 161/90 (!) 149/90  Pulse: 73 (!) 58    Gen: NAD CV: S1 S2 RRR Lungs: Clear to auscultation bilaterally Abd: soft, nontender   Previous Pelvic Exam showed: ***    Assessment/ Plan  Assessment: The patient is a 47y.o. year old scheduled to undergo ***. {desc; verbal/written:16408} consent was obtained for these procedures.  Plan: General Surgical Consent: The patient has previously been counseled on alternative treatments, and the decision by the patient and provider was to proceed with the procedure listed above.  For all procedures, there are risks of bleeding, infection, damage to surrounding organs including but not limited to bowel, bladder, blood vessels, ureters and nerves, and need for further surgery if an injury were to occur. These risks are all low with minimally invasive  surgery.   There are risks of numbness and weakness at any body site or buttock/rectal pain.  It is possible that baseline pain can be worsened by surgery, either with or without mesh. If surgery is vaginal, there is also a low risk of possible conversion to laparoscopy or open abdominal incision where indicated. Very rare risks include blood transfusion, blood clot, heart attack, pneumonia, or death.   There is also a risk of short-term postoperative urinary retention with need to use a catheter. About half of patients need to go home from surgery with a catheter, which is then later removed in the office. The risk of long-term need for a catheter is very low. There is also a risk of worsening of overactive bladder.   ***Sling: The effectiveness of a midurethral vaginal mesh sling is approximately 85%, and thus, there will be  times when you may leak urine after surgery, especially if your bladder is full or if you have a strong cough. There is a balance between making the sling tight enough to treat your leakage but not too tight so that you have long-term difficulty emptying your bladder. A mesh sling will not directly treat overactive bladder/urge incontinence and may worsen it.  There is an FDA safety notification on vaginal mesh procedures for prolapse but NOT mesh slings. We have extensive experience and training with mesh placement and we have close postoperative follow up to identify any potential complications from mesh. It is important to realize that this mesh is a permanent implant that cannot be easily removed. There are rare risks of mesh exposure (2-4%), pain with intercourse (0-7%), and infection (<1%). The risk of mesh exposure if more likely in a woman with risks for poor healing (prior radiation, poorly controlled diabetes, or immunocompromised). The risk of new or worsened chronic pain after mesh implant is more common in women with baseline chronic pain and/or poorly controlled anxiety or  depression. Approximately 2-4% of patients will experience longer-term post-operative voiding dysfunction that may require surgical revision of the sling. We also reviewed that postoperatively, her stream may not be as strong as before surgery.   *** Prolapse (with or without mesh): Risk factors for surgical failure  include things that put pressure on your pelvis and the surgical repair, including obesity, chronic cough, and heavy lifting or straining (including lifting children or adults, straining on the toilet, or lifting heavy objects such as furniture or anything weighing >25 lbs. Risks of recurrence is 20-30% with vaginal native tissue repair and a less than 10% with sacrocolpopexy with mesh.    ***Sacrocolpopexy: Mesh implants may provide more prolapse support, but do have some unique risks to consider. It is important to understand that mesh is permanent and cannot be easily removed. Risks of abdominal sacrocolpopexy mesh include mesh exposure (~3-6%), painful intercourse (recent studies show lower rates after surgery compared to before, with ~5-8% risk of new onset), and very rare risks of bowel or bladder injury or infection (<1%). The risk of mesh exposure is more likely in a woman with risks for poor healing (prior radiation, poorly controlled diabetes, or immunocompromised). The risk of new or worsened chronic pain after mesh implant is more common in women with baseline chronic pain and/or poorly controlled anxiety or depression. There is an FDA safety notification on vaginal mesh procedures for prolapse but NOT abdominal mesh procedures and therefore does not apply to your surgery. We have extensive experience and training with mesh placement and we have close postoperative follow up to identify any potential complications from mesh.    We discussed consent for blood products. Risks for blood transfusion include allergic reactions, other reactions that can affect different body organs and  managed accordingly, transmission of infectious diseases such as HIV or Hepatitis. However, the blood is screened. Patient consents for blood products.***  Pre-operative instructions:  She was instructed to not take Aspirin/NSAIDs x 7days prior to surgery. She may continue her '81mg'$  ASA.*** Antibiotic prophylaxis was ordered as indicated.  Catheter use: Patient will go home with foley if needed after post-operative voiding trial.  Post-operative instructions:  She was provided with specific post-operative instructions, including precautions and signs/symptoms for which we would recommend contacting us, in addition to daytime and after-hours contact phone numbers. This was provided on a handout.   Post-operative medications: ***Prescriptions for motrin, tylenol, miralax, and oxycodone were  sent to her pharmacy. Discussed using ibuprofen and tylenol on a schedule to limit use of narcotics.   Laboratory testing:  We will check labs: ***. Day of surgery UPT***  Preoperative clearance:  She {does/does not:19886} require surgical clearance.    Post-operative follow-up:  A post-operative appointment will be made for 6 weeks from the date of surgery. If she needs a post-operative nurse visit for a voiding trial, that will be set up after she leaves the hospital.    Patient will call the clinic or use MyChart should anything change or any new issues arise.   Jaquita Folds, MD   ***OR orders

## 2022-04-14 ENCOUNTER — Encounter: Payer: Self-pay | Admitting: Obstetrics and Gynecology

## 2022-04-14 NOTE — H&P (Signed)
[facility] Pre-Operative H&P  Subjective Chief Complaint: Megan Cummings presents for a preoperative encounter.   History of Present Illness: Megan Cummings is a 47 y.o. female who presents for preoperative visit.  She is scheduled to undergo Excision of suburethral cyst, cystoscopy on 05/09/22.    CMG showed normal sensation, and normal cystometric capacity. Findings negative for stress incontinence, negative for detrusor overactivity.   MRI pelvis:  12 x 7 mm focus of T1 low signal with peripheral enhancing rim at the level of the urethral meatus. Imaging features are not consistent with urethral diverticulum. This finding is not associated with the vaginal wall as typically seen for Bartholin's gland cyst. Cyst or abscess of the Skene's gland would be a consideration although Skene's gland cyst typically has increased T2 signal (which this lesion does not) and is slightly more lateral to the urethral meatus while this finding is essentially at the midline. As such, the finding is considered indeterminate.  Past Medical History:  Diagnosis Date   Basal cell carcinoma 09/21/2015   Left anterior shoulder. Superficial and nodular patterns.   Basal cell carcinoma 09/28/2015   Left spinal upper back. Superficial.   Basal cell carcinoma 12/06/2016   Right forearm. Superficial.    Breast cancer ([place])    Left   Complication of anesthesia    woke up during colonoscopy   Dysplastic nevus 12/06/2016   Right spinal mid back. Severe atypia and halo nevus effect, margin involved. Excised: 02/13/2017. Margins free   Dysplastic nevus 07/04/2017   Right upper back. Moderate atypia, close to margin.   Family history of colon cancer    Family history of lung cancer    Fibrocystic breast    History of kidney stones    h/o   Personal history of radiation therapy    Stress incontinence    Umbilical hernia 43/1540     Past Surgical History:  Procedure Laterality Date    BREAST BIOPSY Left 01/13/2021   [place] bx/ ribbon clip/dcis grade 1   BREAST LUMPECTOMY Left 01/25/2021   COLONOSCOPY N/A 11/18/2020   Procedure: COLONOSCOPY;  Surgeon: Virgel Manifold, MD;  Location: [facility] END[facility];  Service: Endoscopy;  Laterality: N/A;   FOOT SURGERY Right 07/04/2010   PART MASTECTOMY,RADIO FREQUENCY LOCALIZER,AXILLARY SENTINEL NODE BIOPSY Left 01/25/2021   Procedure: PART MASTECTOMY,RADIO FREQUENCY LOCALIZER,AXILLARY SENTINEL NODE BIOPSY;  Surgeon: Herbert Pun, MD;  Location: [facility] ORS;  Service: General;  Laterality: Left;    is allergic to sulfa antibiotics.   Family History  Problem Relation Age of Onset   Healthy Mother    Hyperlipidemia Father    Healthy Brother    Lung cancer Maternal Uncle    Healthy Maternal Grandmother    Emphysema Maternal Grandfather    Colon cancer Paternal Grandmother    Lung cancer Paternal Grandfather    Healthy Son    Breast cancer Neg Hx     Social History   Tobacco Use   Smoking status: Former    Packs/day: 0.50    Years: 10.00    Total pack years: 5.00    Types: Cigarettes    Quit date: 2005    Years since quitting: 19.0   Smokeless tobacco: Never  Vaping Use   Vaping Use: Never used  Substance Use Topics   Alcohol use: Yes    Comment: Occasionally drinks wine   Drug use: No     Review of Systems was negative for a full 10 system review except as  noted in the History of Present Illness.  No current facility-administered medications for this encounter.  Current Outpatient Medications:    acetaminophen (TYLENOL) 500 MG tablet, Take 1 tablet (500 mg total) by mouth every 6 (six) hours as needed (pain)., Disp: 30 tablet, Rfl: 0   anastrozole (ARIMIDEX) 1 MG tablet, TAKE 1 TABLET(1 MG) BY MOUTH DAILY. START AFTER HAVING 3 DOSES OF GOSERELIN INJECT. (Patient not taking: Reported on 04/13/2022), Disp: 90 tablet, Rfl: 1   Calcium Carb-Cholecalciferol (CALCIUM 600 + D PO), Take 1 Dose by mouth daily. Vitamin  d is 271mg, Disp: , Rfl:    HYDROcodone-acetaminophen (NORCO/VICODIN) 5-325 MG tablet, Take by mouth., Disp: , Rfl:    ibuprofen (ADVIL) 600 MG tablet, Take 1 tablet (600 mg total) by mouth every 6 (six) hours as needed., Disp: 30 tablet, Rfl: 0   KRILL OIL PO, Take 1,000 mg by mouth daily., Disp: , Rfl:    LORazepam (ATIVAN) 0.5 MG tablet, TAKE 1 TABLET(0.5 MG) BY MOUTH TWICE DAILY AS NEEDED FOR ANXIETY, Disp: 30 tablet, Rfl: 0   magnesium 30 MG tablet, Take 30 mg by mouth 2 (two) times daily., Disp: , Rfl:    meloxicam (MOBIC) 15 MG tablet, Take 15 mg by mouth daily as needed., Disp: , Rfl:    oxyCODONE (OXY IR/ROXICODONE) 5 MG immediate release tablet, Take 1 tablet (5 mg total) by mouth every 4 (four) hours as needed for severe pain., Disp: 5 tablet, Rfl: 0  Facility-Administered Medications Ordered in Other Encounters:    goserelin (ZOLADEX) injection 10.8 mg, 10.8 mg, Subcutaneous, Q90 days, RSindy Guadeloupe MD, 10.8 mg at 03/03/22 1146   goserelin (ZOLADEX) injection 3.6 mg, 3.6 mg, Subcutaneous, Q28 days, RSindy Guadeloupe MD, 3.6 mg at 07/27/21 1343   goserelin (ZOLADEX) injection 3.6 mg, 3.6 mg, Subcutaneous, Q28 days, RSindy Guadeloupe MD, 3.6 mg at 10/26/21 1453   Objective There were no vitals filed for this visit.   Gen: NAD CV: S1 S2 RRR Lungs: Clear to auscultation bilaterally Abd: soft, nontender   Previous Pelvic Exam showed: Pelvic Exam: Normal external female genitalia; Bartholin's and Skene's glands normal in appearance; urethral meatus appears normal but 1cm suburethral cyst present, when palpated feels gritty (small stones possibly present?)    Assessment/ Plan  The patient is a 47y.o. 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    MJaquita Folds MD

## 2022-04-18 ENCOUNTER — Ambulatory Visit: Payer: BC Managed Care – PPO | Admitting: Radiation Oncology

## 2022-04-29 ENCOUNTER — Encounter: Payer: Self-pay | Admitting: Oncology

## 2022-04-29 ENCOUNTER — Inpatient Hospital Stay: Payer: BC Managed Care – PPO | Attending: Oncology | Admitting: Oncology

## 2022-04-29 VITALS — BP 170/96 | HR 58 | Temp 97.9°F | Resp 16 | Ht 69.0 in | Wt 163.4 lb

## 2022-04-29 DIAGNOSIS — Z85828 Personal history of other malignant neoplasm of skin: Secondary | ICD-10-CM | POA: Diagnosis not present

## 2022-04-29 DIAGNOSIS — I10 Essential (primary) hypertension: Secondary | ICD-10-CM | POA: Diagnosis not present

## 2022-04-29 DIAGNOSIS — Z08 Encounter for follow-up examination after completed treatment for malignant neoplasm: Secondary | ICD-10-CM

## 2022-04-29 DIAGNOSIS — C50912 Malignant neoplasm of unspecified site of left female breast: Secondary | ICD-10-CM | POA: Insufficient documentation

## 2022-04-29 DIAGNOSIS — Z87891 Personal history of nicotine dependence: Secondary | ICD-10-CM | POA: Diagnosis not present

## 2022-04-29 DIAGNOSIS — Z800 Family history of malignant neoplasm of digestive organs: Secondary | ICD-10-CM | POA: Insufficient documentation

## 2022-04-29 DIAGNOSIS — Z923 Personal history of irradiation: Secondary | ICD-10-CM | POA: Insufficient documentation

## 2022-04-29 DIAGNOSIS — Z79811 Long term (current) use of aromatase inhibitors: Secondary | ICD-10-CM | POA: Insufficient documentation

## 2022-04-29 DIAGNOSIS — Z801 Family history of malignant neoplasm of trachea, bronchus and lung: Secondary | ICD-10-CM | POA: Diagnosis not present

## 2022-04-29 DIAGNOSIS — Z170 Estrogen receptor positive status [ER+]: Secondary | ICD-10-CM | POA: Insufficient documentation

## 2022-04-29 DIAGNOSIS — Z853 Personal history of malignant neoplasm of breast: Secondary | ICD-10-CM

## 2022-04-29 NOTE — Progress Notes (Signed)
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  Mammogram showed innumerable cysts in the left breast the largest of which was 1.2 x 1.1 x 1.4 cm.  Targeted ultrasound of the inner left breast showed a hypoechoic mass measuring 1 x 0.8 x 1.9 cm.  No definite lymphadenopathy noted in the left axilla.  No suspicious findings in the right breast.  She has had her last mammogram in 2019 when was again noted to have bilateral breast cysts and subsequent ultrasounds showed benign cysts with no suspicious lesions.Ultrasound-guided biopsy of the left breast mass showed invasive mammary carcinoma 6 mm grade 1 ER 81 to 90% positive PR 91 200% positive and HER2 equivocal by IHC and FISH testing negative   Menarche at the age of 58.  She is G1, P1 L1.  No use of birth control.  No prior hysterectomy.  She is still premenopausal and gets her menstrual cycles regularly.  Family history significant for colon cancer in paternal grandmother and lung cancer or lymphoma in maternal grandfather.   Final lumpectomy pathology showed 13 mm grade 1 invasive mammary carcinoma with negative margins.  3 sentinel lymph nodes negative for malignancy.  No  lymphovascular invasion.  Oncotype score came back at 16And chemotherapy benefit for this score in patients less than 32 years of age would be ~1.6%.  Adjuvant chemotherapy was not recommended.  Patient completed adjuvant radiation treatment.  Patient is on ovarian suppression with Zoladex plus Arimidex started in April 2023.  Interval history-patient's blood pressure systolic readings have been consistently in the 170s.  She stopped taking Arimidex a couple of weeks ago and does report improvement in her overall sense of wellbeing as well as energy levels.  Shortly after she stopped the Arimidex she had some vaginal spotting for few days.  ECOG PS- 0 Pain scale- 0   Review of systems- Review of Systems  Constitutional:  Positive for malaise/fatigue. Negative for chills, fever and weight loss.  HENT:  Negative for congestion, ear discharge and nosebleeds.   Eyes:  Negative for blurred vision.  Respiratory:  Negative for cough, hemoptysis, sputum production, shortness of breath and wheezing.   Cardiovascular:  Negative for chest pain, palpitations, orthopnea and claudication.  Gastrointestinal:  Negative for abdominal pain, blood in stool, constipation, diarrhea, heartburn, melena, nausea and vomiting.  Genitourinary:  Negative for dysuria, flank pain, frequency, hematuria and urgency.  Musculoskeletal:  Positive for joint pain. Negative for back pain and myalgias.  Skin:  Negative for rash.  Neurological:  Negative for dizziness, tingling, focal weakness, seizures, weakness and headaches.  Endo/Heme/Allergies:  Does not bruise/bleed easily.  Psychiatric/Behavioral:  Negative for depression and suicidal ideas. The patient does not have insomnia.       Allergies  Allergen Reactions   Sulfa Antibiotics Rash     Past Medical History:  Diagnosis Date   Basal cell carcinoma 09/21/2015   Left anterior shoulder. Superficial and nodular patterns.   Basal cell carcinoma 09/28/2015   Left  spinal upper back. Superficial.   Basal cell carcinoma 12/06/2016   Right forearm. Superficial.    Breast cancer ([place])    Left   Complication of anesthesia    woke up during colonoscopy   Dysplastic nevus 12/06/2016   Right spinal mid back. Severe atypia and halo nevus effect, margin involved. Excised: 02/13/2017. Margins free   Dysplastic nevus 07/04/2017   Right upper back. Moderate atypia, close to margin.   Family history of colon cancer    Family history of lung cancer    Fibrocystic breast    History of kidney stones    h/o   Personal history of radiation therapy    Stress incontinence    Umbilical hernia [id number]/[id number]     Past Surgical History:  Procedure Laterality Date   BREAST BIOPSY Left 01/13/2021   [place] bx/ ribbon clip/dcis grade 1   BREAST LUMPECTOMY Left 01/25/2021   COLONOSCOPY N/A 11/18/2020   Procedure: COLONOSCOPY;  Surgeon: Virgel Manifold, MD;  Location: [facility] ENDOSCOPY;  Service: Endoscopy;  Laterality: N/A;   FOOT SURGERY Right 07/04/2010   PART MASTECTOMY,RADIO FREQUENCY LOCALIZER,AXILLARY SENTINEL NODE BIOPSY Left 01/25/2021   Procedure: PART MASTECTOMY,RADIO FREQUENCY LOCALIZER,AXILLARY SENTINEL NODE BIOPSY;  Surgeon: Herbert Pun, MD;  Location: [facility] ORS;  Service: General;  Laterality: Left;    Social History   Socioeconomic History   Marital status: Married    Spouse name: Not on file   Number of children: Not on file   Years of education: Not on file   Highest education level: Not on file  Occupational History   Not on file  Tobacco Use   Smoking status: Former    Packs/day: 0.50    Years: 10.00    Total pack years: 5.00    Types: Cigarettes    Quit date: 2005    Years since quitting: 19.0   Smokeless tobacco: Never  Vaping Use   Vaping Use: Never used  Substance and Sexual Activity   Alcohol use: Yes    Comment: Occasionally drinks wine   Drug use: No   Sexual activity: Yes    Birth control/protection: None  Other Topics  Concern   Not on file  Social History Narrative   Not on file   Social Determinants of Health   Financial Resource Strain: Not on file  Food Insecurity: Not on file  Transportation Needs: Not on file  Physical Activity: Not on file  Stress: Not on file  Social Connections: Not on file  Intimate Partner Violence: Not on file    Family History  Problem Relation Age of Onset   Healthy Mother    Hyperlipidemia Father    Healthy Brother    Lung cancer Maternal Uncle    Healthy Maternal Grandmother    Emphysema Maternal Grandfather    Colon cancer Paternal Grandmother    Lung cancer Paternal Grandfather    Healthy Son    Breast cancer Neg Hx      Current Outpatient Medications:    acetaminophen (TYLENOL) 500 MG tablet, Take 1 tablet (500 mg total) by mouth every 6 (six) hours as needed (pain)., Disp: 30 tablet, Rfl: 0   Calcium Carb-Cholecalciferol (CALCIUM 600 + D PO), Take 1 Dose by mouth daily. Vitamin d is 280mg, Disp: , Rfl:    HYDROcodone-acetaminophen (NORCO/VICODIN) 5-325 MG tablet, Take by  mouth., Disp: , Rfl:    ibuprofen (ADVIL) 600 MG tablet, Take 1 tablet (600 mg total) by mouth every 6 (six) hours as needed., Disp: 30 tablet, Rfl: 0   KRILL OIL PO, Take 1,000 mg by mouth daily., Disp: , Rfl:    LORazepam (ATIVAN) 0.5 MG tablet, TAKE 1 TABLET(0.5 MG) BY MOUTH TWICE DAILY AS NEEDED FOR ANXIETY, Disp: 30 tablet, Rfl: 0   magnesium 30 MG tablet, Take 30 mg by mouth 2 (two) times daily., Disp: , Rfl:    meloxicam (MOBIC) 15 MG tablet, Take 15 mg by mouth daily as needed., Disp: , Rfl:    oxyCODONE (OXY IR/ROXICODONE) 5 MG immediate release tablet, Take 1 tablet (5 mg total) by mouth every 4 (four) hours as needed for severe pain., Disp: 5 tablet, Rfl: 0   anastrozole (ARIMIDEX) 1 MG tablet, TAKE 1 TABLET(1 MG) BY MOUTH DAILY. START AFTER HAVING 3 DOSES OF GOSERELIN INJECT. (Patient not taking: Reported on 04/13/2022), Disp: 90 tablet, Rfl: 1 No current  facility-administered medications for this visit.  Facility-Administered Medications Ordered in Other Visits:    goserelin (ZOLADEX) injection 10.8 mg, 10.8 mg, Subcutaneous, Q90 days, Sindy Guadeloupe, MD, 10.8 mg at 03/03/22 1146   goserelin (ZOLADEX) injection 3.6 mg, 3.6 mg, Subcutaneous, Q28 days, Sindy Guadeloupe, MD, 3.6 mg at 07/27/21 1343   goserelin (ZOLADEX) injection 3.6 mg, 3.6 mg, Subcutaneous, Q28 days, Sindy Guadeloupe, MD, 3.6 mg at 10/26/21 1453  Physical exam:  Vitals:   04/29/22 1312  BP: (!) 170/96  Pulse: (!) 58  Resp: 16  Temp: 97.9 F (36.6 C)  TempSrc: Oral  Weight: 163 lb 6.4 oz (74.1 kg)  Height: '5\' 9"'$  (1.753 m)   Physical Exam Cardiovascular:     Rate and Rhythm: Normal rate and regular rhythm.     Heart sounds: Normal heart sounds.  Pulmonary:     Effort: Pulmonary effort is normal.     Breath sounds: Normal breath sounds.  Skin:    General: Skin is warm and dry.  Neurological:     Mental Status: She is alert and oriented to person, place, and time.    Breast exam was performed in seated and lying down position. Patient is status post left lumpectomy with a well-healed surgical scar. No evidence of any palpable masses. No evidence of axillary adenopathy. No evidence of any palpable masses or lumps in the right breast. No evidence of right axillary adenopathy      Latest Ref Rng & Units 04/06/2022    9:50 AM  CMP  Glucose 70 - 99 mg/dL 96   BUN 6 - 24 mg/dL 11   Creatinine 0.57 - 1.00 mg/dL 0.90   Sodium 134 - 144 mmol/L 142   Potassium 3.5 - 5.2 mmol/L 4.0   Chloride 96 - 106 mmol/L 102   CO2 20 - 29 mmol/L 24   Calcium 8.7 - 10.2 mg/dL 9.7   Total Protein 6.0 - 8.5 g/dL 6.8   Total Bilirubin 0.0 - 1.2 mg/dL 0.8   Alkaline Phos 44 - 121 IU/L 72   AST 0 - 40 IU/L 23   ALT 0 - 32 IU/L 16       Latest Ref Rng & Units 04/06/2022    9:50 AM  CBC  WBC 3.4 - 10.8 x10E3/uL 5.5   Hemoglobin 11.1 - 15.9 g/dL 13.4   Hematocrit 34.0 - 46.6 % 40.7    Platelets 150 - 450 x10E3/uL 377  Assessment and plan- Patient is a 46 y.o. female with 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  She is here for routine follow-up of breast cancer  Patient last received Zoladex in November 2023.  If we were to continue ovarian suppression she will be due for her next dose end of February 2024.  I discussed with the patient that given the fact that she has been having significant fatigue and worsening quality of life on OS plus AI we have the following options moving forward:  Continue ovarian suppression but switch from Arimidex to letrozole or Aromasin.   Stop ovarian suppression plus AI altogether and switch back to tamoxifen given that she is premenopausal.  Patient would like to think about her options and see which way she would like to go.  For now she will hold her Arimidex for the next few weeks  Hypertension: Even prior to starting endocrine therapy patient's blood pressure readings in our office have been between 1 91-6 50 systolic.  After starting OS plus AI her blood pressure has been higher in the 170s.  It continues to be high despite stopping Arimidex.  I have asked her to keep an eye on her blood pressure over the next few weeks and if it remains more than systolic 606 she will need to talk to her primary care doctor about starting antihypertensives.  I will see her back in 4 months   Visit Diagnosis 1. Encounter for follow-up surveillance of breast cancer      Dr. Randa Evens, MD, MPH [facility] at [facility] [phone] 04/29/2022 3:50 PM

## 2022-05-02 ENCOUNTER — Encounter: Payer: Self-pay | Admitting: Family Medicine

## 2022-05-02 NOTE — Progress Notes (Signed)
Patient states she needs to re-schedule. Dr Schroeder's office number given to patient. Patient stated she would call her office today and re-schedule.

## 2022-05-03 MED ORDER — VALSARTAN 40 MG PO TABS: 40.0000 mg | ORAL_TABLET | Freq: Every day | ORAL | 2 refills | Status: DC

## 2022-05-04 ENCOUNTER — Encounter: Payer: Self-pay | Admitting: Radiation Oncology

## 2022-05-04 ENCOUNTER — Ambulatory Visit
Admission: RE | Admit: 2022-05-04 | Discharge: 2022-05-04 | Disposition: A | Discharge status: Home / Self Care | Payer: BC Managed Care – PPO | Source: Ambulatory Visit | Attending: Radiation Oncology | Admitting: Radiation Oncology

## 2022-05-04 VITALS — BP 166/95 | HR 56 | Temp 97.0°F | Resp 14 | Ht 69.0 in | Wt 163.2 lb

## 2022-05-04 DIAGNOSIS — Z923 Personal history of irradiation: Secondary | ICD-10-CM | POA: Diagnosis not present

## 2022-05-04 DIAGNOSIS — Z79811 Long term (current) use of aromatase inhibitors: Secondary | ICD-10-CM | POA: Insufficient documentation

## 2022-05-04 DIAGNOSIS — C50912 Malignant neoplasm of unspecified site of left female breast: Secondary | ICD-10-CM | POA: Insufficient documentation

## 2022-05-04 DIAGNOSIS — Z170 Estrogen receptor positive status [ER+]: Secondary | ICD-10-CM | POA: Diagnosis not present

## 2022-05-04 DIAGNOSIS — I10 Essential (primary) hypertension: Secondary | ICD-10-CM | POA: Insufficient documentation

## 2022-05-04 DIAGNOSIS — C50312 Malignant neoplasm of lower-inner quadrant of left female breast: Secondary | ICD-10-CM | POA: Diagnosis not present

## 2022-05-04 NOTE — Progress Notes (Signed)
Radiation Oncology Follow up Note  Name: Megan Cummings   Date:   05/04/2022 MRN:  [id number] DOB: June 23, 1975    This 47 y.o. female presents to the clinic today for 1 year follow-up status post whole breast radiation to her left breast for stage Ia (T1 cN0 M0) ER/PR positive invasive mammary carcinoma.  REFERRING PROVIDER: Gwyneth Sprout, FNP  HPI: Patient is a 47 year old female now out 1 year having completed whole breast radiation to her left breast for ER/PR positive stage Ia invasive mammary carcinoma.  Seen today in routine follow-up she is doing well.  She specifically denies breast tenderness cough or bone pain..  She had mammograms back in October which showed surgical changes in the left breast consistent with history of lumpectomy BI-RADS 2 benign.  She has been on Arimidex although it has been recently discontinued secondary to 6 episodes of hypertension.  She is also currently on Zoladex.  COMPLICATIONS OF TREATMENT: none  FOLLOW UP COMPLIANCE: keeps appointments   PHYSICAL EXAM:  BP (!) 166/95   Pulse (!) 56   Temp (!) 97 F (36.1 C)   Resp 14   Ht '5\' 9"'$  (1.753 m)   Wt 163 lb 3.2 oz (74 kg)   LMP  (LMP Unknown)   BMI 24.10 kg/m  Left breast has some contraction of the breast towards her surgical scar performed in a horizontal manner.  No dominant masses noted in either breast no axillary or supraclavicular adenopathy is identified.  Well-developed well-nourished patient in NAD. HEENT reveals PERLA, EOMI, discs not visualized.  Oral cavity is clear. No oral mucosal lesions are identified. Neck is clear without evidence of cervical or supraclavicular adenopathy. Lungs are clear to A&P. Cardiac examination is essentially unremarkable with regular rate and rhythm without murmur rub or thrill. Abdomen is benign with no organomegaly or masses noted. Motor sensory and DTR levels are equal and symmetric in the upper and lower extremities. Cranial nerves II through XII are grossly  intact. Proprioception is intact. No peripheral adenopathy or edema is identified. No motor or sensory levels are noted. Crude visual fields are within normal range.  RADIOLOGY RESULTS: Mammograms reviewed compatible with above-stated findings  PLAN: Present time patient is doing well with no evidence of disease now out 1 year.  On pleased with her overall progress.  I have asked to see her back in 1 year for follow-up.  Patient knows to call with any concerns.  I would like to take this opportunity to thank you for allowing me to participate in the care of your patient.Noreene Filbert, MD

## 2022-05-16 ENCOUNTER — Encounter: Payer: Self-pay | Admitting: Oncology

## 2022-05-17 ENCOUNTER — Other Ambulatory Visit: Payer: Self-pay | Admitting: Oncology

## 2022-05-23 ENCOUNTER — Encounter: Payer: Self-pay | Admitting: *Deleted

## 2022-05-30 ENCOUNTER — Ambulatory Visit: Payer: BC Managed Care – PPO

## 2022-06-01 ENCOUNTER — Inpatient Hospital Stay: Payer: BC Managed Care – PPO | Attending: Oncology

## 2022-06-01 DIAGNOSIS — C50912 Malignant neoplasm of unspecified site of left female breast: Secondary | ICD-10-CM

## 2022-06-01 DIAGNOSIS — Z170 Estrogen receptor positive status [ER+]: Secondary | ICD-10-CM | POA: Diagnosis not present

## 2022-06-01 DIAGNOSIS — Z79811 Long term (current) use of aromatase inhibitors: Secondary | ICD-10-CM | POA: Diagnosis not present

## 2022-06-01 MED ORDER — LEUPROLIDE ACETATE (3 MONTH) 11.25 MG IM KIT
11.2500 mg | PACK | Freq: Once | INTRAMUSCULAR | Status: AC
  Administered 2022-06-01: 11.25 mg via INTRAMUSCULAR
  Filled 2022-06-01: qty 11.25

## 2022-06-01 MED ORDER — LEUPROLIDE ACETATE 3.75 MG IM KIT
11.2500 mg | PACK | INTRAMUSCULAR | Status: DC
  Filled 2022-06-01: qty 11.25

## 2022-06-21 ENCOUNTER — Encounter: Payer: BC Managed Care – PPO | Admitting: Obstetrics and Gynecology

## 2022-07-03 ENCOUNTER — Other Ambulatory Visit: Payer: Self-pay | Admitting: Oncology

## 2022-07-04 ENCOUNTER — Encounter: Payer: Self-pay | Admitting: Oncology

## 2022-07-08 ENCOUNTER — Encounter (HOSPITAL_BASED_OUTPATIENT_CLINIC_OR_DEPARTMENT_OTHER): Payer: Self-pay | Admitting: Obstetrics and Gynecology

## 2022-07-12 ENCOUNTER — Encounter (HOSPITAL_BASED_OUTPATIENT_CLINIC_OR_DEPARTMENT_OTHER): Payer: Self-pay | Admitting: Obstetrics and Gynecology

## 2022-07-12 NOTE — Progress Notes (Signed)
Spoke w/ via phone for pre-op interview--- pt Lab needs dos----   istat, EKG, urine preg            Lab results------ no COVID test -----patient states asymptomatic no test needed Arrive at ------- 0900 on 12-18-2022 NPO after MN NO Solid Food.  Clear liquids from MN until--- 0800 Med rec completed Medications to take morning of surgery --- none Diabetic medication -----  n/a Patient instructed no nail polish to be worn day of surgery Patient instructed to bring photo id and insurance card day of surgery Patient aware to have Driver (ride ) / caregiver    for 24 hours after surgery -- husband, joseph Patient Special Instructions ----- n/a Pre-Op special Istructions ----- n/a Patient verbalized understanding of instructions that were given at this phone interview. Patient denies shortness of breath, chest pain, fever, cough at this phone interview.

## 2022-07-13 ENCOUNTER — Encounter: Payer: Self-pay | Admitting: Obstetrics and Gynecology

## 2022-07-13 ENCOUNTER — Ambulatory Visit (INDEPENDENT_AMBULATORY_CARE_PROVIDER_SITE_OTHER): Payer: BC Managed Care – PPO | Admitting: Obstetrics and Gynecology

## 2022-07-13 VITALS — BP 130/85 | HR 78 | Wt 168.2 lb

## 2022-07-13 DIAGNOSIS — N368 Other specified disorders of urethra: Secondary | ICD-10-CM | POA: Diagnosis not present

## 2022-07-13 DIAGNOSIS — Z01818 Encounter for other preprocedural examination: Secondary | ICD-10-CM

## 2022-07-13 NOTE — H&P (Signed)
[facility] Pre-Operative H&P  Subjective Chief Complaint: Megan Cummings presents for a preoperative encounter.   History of Present Illness: Megan Cummings is a 47 y.o. female who presents for preoperative visit.  She is scheduled to undergo Excision of suburethral cyst, cystoscopy on 07/18/22.    CMG showed normal sensation, and normal cystometric capacity. Findings negative for stress incontinence, negative for detrusor overactivity.   MRI pelvis:  12 x 7 mm focus of T1 low signal with peripheral enhancing rim at the level of the urethral meatus. Imaging features are not consistent with urethral diverticulum. This finding is not associated with the vaginal wall as typically seen for Bartholin's gland cyst. Cyst or abscess of the Skene's gland would be a consideration although Skene's gland cyst typically has increased T2 signal (which this lesion does not) and is slightly more lateral to the urethral meatus while this finding is essentially at the midline. As such, the finding is considered indeterminate.  Past Medical History:  Diagnosis Date   Family history of colon cancer    Family history of lung cancer    Fibrocystic breast    History of basal cell carcinoma (BCC) excision 12/06/2016   09/ 2018 Right forearm. Superficial. 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 rao/  radiation oncologist-- dr g. Rushie Chestnut;  dx 06/ 2022  invasive mammary carcinoma, ER/PR +;    09-25-2020  s/p left partial mastectomy w/ node dissection;  completed radiation 04-06-2021   Personal history of radiation therapy    left breast  03-01-2021  to 04-06-2021   Suburethral cyst    SUI (stress urinary incontinence, female)    Umbilical hernia 10/2015     Past Surgical History:  Procedure Laterality Date   COLONOSCOPY N/A 11/18/2020    Procedure: COLONOSCOPY;  Surgeon: Pasty Spillers, MD;  Location: [facility] ENDOSCOPY;  Service: Endoscopy;  Laterality: N/A;   FOOT SURGERY Right 07/04/2010   great toe   PART MASTECTOMY,RADIO FREQUENCY LOCALIZER,AXILLARY SENTINEL NODE BIOPSY Left 01/25/2021   Procedure: PART MASTECTOMY,RADIO FREQUENCY LOCALIZER,AXILLARY SENTINEL NODE BIOPSY;  Surgeon: Carolan Shiver, MD;  Location: [facility] ORS;  Service: General;  Laterality: Left;    is allergic to sulfa antibiotics.   Family History  Problem Relation Age of Onset   Healthy Mother    Hyperlipidemia Father    Healthy Brother    Lung cancer Maternal Uncle    Healthy Maternal Grandmother    Emphysema Maternal Grandfather    Colon cancer Paternal Grandmother    Lung cancer Paternal Grandfather    Healthy Son    Breast cancer Neg Hx     Social History   Tobacco Use   Smoking status: Former    Packs/day: 0.50    Years: 10.00    Additional pack years: 0.00    Total pack years: 5.00    Types: Cigarettes    Quit date: 2005    Years since quitting: 19.2   Smokeless tobacco: Never  Vaping Use   Vaping Use: Never used  Substance Use Topics   Alcohol use: Yes    Comment: occasional   Drug use: Not Currently    Comment: 07-12-2022  per pt last used approx 20003     Review of Systems was negative for a full 10 system review except as noted in the History  of Present Illness.  No current facility-administered medications for this encounter.  Current Outpatient Medications:    anastrozole (ARIMIDEX) 1 MG tablet, TAKE 1 TABLET(1 MG) BY MOUTH DAILY. START AFTER HAVING 3 DOSES OF GOSERELIN INJECT (Patient taking differently: Take 1 mg by mouth at bedtime.), Disp: 90 tablet, Rfl: 0   Calcium Carb-Cholecalciferol (CALCIUM 600 + D PO), Take 1 tablet by mouth daily., Disp: , Rfl:    HYDROcodone-acetaminophen (NORCO/VICODIN) 5-325 MG tablet, Take by mouth., Disp: , Rfl:    KRILL OIL PO, Take 1,000 mg by mouth daily., Disp: , Rfl:     LORazepam (ATIVAN) 0.5 MG tablet, TAKE 1 TABLET(0.5 MG) BY MOUTH TWICE DAILY AS NEEDED FOR ANXIETY (Patient taking differently: Take 0.5 mg by mouth 2 (two) times daily as needed for anxiety.), Disp: 30 tablet, Rfl: 0   magnesium 30 MG tablet, Take 30 mg by mouth 2 (two) times daily., Disp: , Rfl:    meloxicam (MOBIC) 15 MG tablet, Take 15 mg by mouth daily as needed for pain., Disp: , Rfl:    valsartan (DIOVAN) 40 MG tablet, Take 1 tablet (40 mg total) by mouth daily. (Patient taking differently: Take 40 mg by mouth at bedtime.), Disp: 30 tablet, Rfl: 2   acetaminophen (TYLENOL) 500 MG tablet, Take 1 tablet (500 mg total) by mouth every 6 (six) hours as needed (pain)., Disp: 30 tablet, Rfl: 0   ibuprofen (ADVIL) 600 MG tablet, Take 1 tablet (600 mg total) by mouth every 6 (six) hours as needed., Disp: 30 tablet, Rfl: 0   oxyCODONE (OXY IR/ROXICODONE) 5 MG immediate release tablet, Take 1 tablet (5 mg total) by mouth every 4 (four) hours as needed for severe pain., Disp: 5 tablet, Rfl: 0  Facility-Administered Medications Ordered in Other Encounters:    goserelin (ZOLADEX) injection 10.8 mg, 10.8 mg, Subcutaneous, Q90 days, Creig Hines, MD, 10.8 mg at 03/03/22 1146   goserelin (ZOLADEX) injection 3.6 mg, 3.6 mg, Subcutaneous, Q28 days, Creig Hines, MD, 3.6 mg at 10/26/21 1453   Objective There were no vitals filed for this visit.   Gen: NAD CV: S1 S2 RRR Lungs: Clear to auscultation bilaterally Abd: soft, nontender   Previous Pelvic Exam showed: Pelvic Exam: Normal external female genitalia; Bartholin's and Skene's glands normal in appearance; urethral meatus appears normal but 1cm suburethral cyst present, when palpated feels gritty (small stones possibly present?)    Assessment/ Plan  Assessment: The patient is a 47 y.o. year old year old scheduled to undergo Excision of suburethral cyst, cystoscopy .  Marguerita Beards, MD

## 2022-07-13 NOTE — Progress Notes (Signed)
[facility] Pre-Operative visit  Subjective Chief Complaint: Megan Cummings presents for a preoperative encounter.   History of Present Illness: Megan Cummings is a 47 y.o. female who presents for preoperative visit.  She is scheduled to undergo Excision of suburethral cyst, cystoscopy on 07/18/22.    CMG showed normal sensation, and normal cystometric capacity. Findings negative for stress incontinence, negative for detrusor overactivity.   MRI pelvis:  12 x 7 mm focus of T1 low signal with peripheral enhancing rim at the level of the urethral meatus. Imaging features are not consistent with urethral diverticulum. This finding is not associated with the vaginal wall as typically seen for Bartholin's gland cyst. Cyst or abscess of the Skene's gland would be a consideration although Skene's gland cyst typically has increased T2 signal (which this lesion does not) and is slightly more lateral to the urethral meatus while this finding is essentially at the midline. As such, the finding is considered indeterminate.  Past Medical History:  Diagnosis Date   Family history of colon cancer    Family history of lung cancer    Fibrocystic breast    History of basal cell carcinoma (BCC) excision 12/06/2016   09/ 2018 Right forearm. Superficial. 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 rao/  radiation oncologist-- dr g. Rushie Chestnut;  dx 06/ 2022  invasive mammary carcinoma, ER/PR +;    09-25-2020  s/p left partial mastectomy w/ node dissection;  completed radiation 04-06-2021   Personal history of radiation therapy    left breast  03-01-2021  to 04-06-2021   Suburethral cyst    SUI (stress urinary incontinence, female)    Umbilical hernia 10/2015     Past Surgical History:  Procedure Laterality Date   COLONOSCOPY N/A 11/18/2020    Procedure: COLONOSCOPY;  Surgeon: Pasty Spillers, MD;  Location: [facility];  Service: Endoscopy;  Laterality: N/A;   FOOT SURGERY Right 07/04/2010   great toe   PART MASTECTOMY,RADIO FREQUENCY LOCALIZER,AXILLARY SENTINEL NODE BIOPSY Left 01/25/2021   Procedure: PART MASTECTOMY,RADIO FREQUENCY LOCALIZER,AXILLARY SENTINEL NODE BIOPSY;  Surgeon: Carolan Shiver, MD;  Location: [facility];  Service: General;  Laterality: Left;    is allergic to sulfa antibiotics.   Family History  Problem Relation Age of Onset   Healthy Mother    Hyperlipidemia Father    Healthy Brother    Lung cancer Maternal Uncle    Healthy Maternal Grandmother    Emphysema Maternal Grandfather    Colon cancer Paternal Grandmother    Lung cancer Paternal Grandfather    Healthy Son    Breast cancer Neg Hx     Social History   Tobacco Use   Smoking status: Former    Packs/day: 0.50    Years: 10.00    Additional pack years: 0.00    Total pack years: 5.00    Types: Cigarettes    Quit date: 2005    Years since quitting: 19.2   Smokeless tobacco: Never  Vaping Use   Vaping Use: Never used  Substance Use Topics   Alcohol use: Yes    Comment: occasional   Drug use: Not Currently    Comment: 07-12-2022  per pt last used approx 20003     Review of Systems was negative for a full 10 system review except as noted in the History  of Present Illness.   Current Outpatient Medications:    acetaminophen (TYLENOL) 500 MG tablet, Take 1 tablet (500 mg total) by mouth every 6 (six) hours as needed (pain)., Disp: 30 tablet, Rfl: 0   anastrozole (ARIMIDEX) 1 MG tablet, TAKE 1 TABLET(1 MG) BY MOUTH DAILY. START AFTER HAVING 3 DOSES OF GOSERELIN INJECT (Patient taking differently: Take 1 mg by mouth at bedtime.), Disp: 90 tablet, Rfl: 0   Calcium Carb-Cholecalciferol (CALCIUM 600 + D PO), Take 1 tablet by mouth daily., Disp: , Rfl:    HYDROcodone-acetaminophen (NORCO/VICODIN) 5-325 MG tablet, Take by  mouth., Disp: , Rfl:    ibuprofen (ADVIL) 600 MG tablet, Take 1 tablet (600 mg total) by mouth every 6 (six) hours as needed., Disp: 30 tablet, Rfl: 0   KRILL OIL PO, Take 1,000 mg by mouth daily., Disp: , Rfl:    LORazepam (ATIVAN) 0.5 MG tablet, TAKE 1 TABLET(0.5 MG) BY MOUTH TWICE DAILY AS NEEDED FOR ANXIETY (Patient taking differently: Take 0.5 mg by mouth 2 (two) times daily as needed for anxiety.), Disp: 30 tablet, Rfl: 0   magnesium 30 MG tablet, Take 30 mg by mouth 2 (two) times daily., Disp: , Rfl:    meloxicam (MOBIC) 15 MG tablet, Take 15 mg by mouth daily as needed for pain., Disp: , Rfl:    oxyCODONE (OXY IR/ROXICODONE) 5 MG immediate release tablet, Take 1 tablet (5 mg total) by mouth every 4 (four) hours as needed for severe pain., Disp: 5 tablet, Rfl: 0   valsartan (DIOVAN) 40 MG tablet, Take 1 tablet (40 mg total) by mouth daily. (Patient taking differently: Take 40 mg by mouth at bedtime.), Disp: 30 tablet, Rfl: 2 No current facility-administered medications for this visit.  Facility-Administered Medications Ordered in Other Visits:    goserelin (ZOLADEX) injection 10.8 mg, 10.8 mg, Subcutaneous, Q90 days, Creig Hinesao, Archana C, MD, 10.8 mg at 03/03/22 1146   goserelin (ZOLADEX) injection 3.6 mg, 3.6 mg, Subcutaneous, Q28 days, Creig Hinesao, Archana C, MD, 3.6 mg at 10/26/21 1453   Objective Vitals:   07/13/22 1002 07/13/22 1045  BP: (!) 158/79 130/85  Pulse: 79 78    Gen: NAD CV: S1 S2 RRR Lungs: Clear to auscultation bilaterally Abd: soft, nontender   Previous Pelvic Exam showed: Pelvic Exam: Normal external female genitalia; Bartholin's and Skene's glands normal in appearance; urethral meatus appears normal but 1cm suburethral cyst present, when palpated feels gritty (small stones possibly present?)    Assessment/ Plan  Assessment: The patient is a 47 y.o. year old scheduled to undergo Excision of suburethral cyst, cystoscopy . Verbal consent was obtained for these  procedures.  Plan: General Surgical Consent: The patient has previously been counseled on alternative treatments, and the decision by the patient and provider was to proceed with the procedure listed above.  For all procedures, there are risks of bleeding, infection, damage to surrounding organs including but not limited to bowel, bladder, blood vessels, ureters and nerves, and need for further surgery if an injury were to occur. These risks are all low with minimally invasive surgery.   There are risks of numbness and weakness at any body site or buttock/rectal pain.  It is possible that baseline pain can be worsened by surgery, either with or without mesh. If surgery is vaginal, there is also a low risk of possible conversion to laparoscopy or open abdominal incision where indicated. Very rare risks include blood transfusion, blood clot, heart attack, pneumonia, or death.   There is also  a risk of short-term postoperative urinary retention with need to use a catheter. About half of patients need to go home from surgery with a catheter, which is then later removed in the office. The risk of long-term need for a catheter is very low. There is also a risk of worsening of overactive bladder.   We discussed consent for blood products. Risks for blood transfusion include allergic reactions, other reactions that can affect different body organs and managed accordingly, transmission of infectious diseases such as HIV or Hepatitis. However, the blood is screened. Patient consents for blood products.  Pre-operative instructions:  She was instructed to not take Aspirin/NSAIDs x 7days prior to surgery.  Antibiotic prophylaxis was ordered as indicated.  Catheter use: Patient will go home with foley if needed after post-operative voiding trial.  Post-operative instructions:  She was provided with specific post-operative instructions, including precautions and signs/symptoms for which we would recommend  contacting us, in addition to daytime and after-hours contact phone numbers. This was provided on a handout.   Post-operative medications: Previously sent medications to the pharmacy.   Laboratory testing:  No labs needed  Preoperative clearance:  She does not require surgical clearance.    Post-operative follow-up:  A post-operative appointment will be made for 6 weeks from the date of surgery. If she needs a post-operative nurse visit for a voiding trial, that will be set up after she leaves the hospital.    Patient will call the clinic or use MyChart should anything change or any new issues arise.   Marguerita Beards, MD  Time spent: I spent 20 minutes dedicated to the care of this patient on the date of this encounter to include pre-visit review of records, face-to-face time with the patient and post visit documentation.

## 2022-07-18 ENCOUNTER — Encounter (HOSPITAL_BASED_OUTPATIENT_CLINIC_OR_DEPARTMENT_OTHER): Payer: Self-pay | Admitting: Obstetrics and Gynecology

## 2022-07-18 ENCOUNTER — Ambulatory Visit (HOSPITAL_BASED_OUTPATIENT_CLINIC_OR_DEPARTMENT_OTHER)
Admission: RE | Admit: 2022-07-18 | Discharge: 2022-07-18 | Disposition: A | Discharge status: Home / Self Care | Payer: BC Managed Care – PPO | Attending: Obstetrics and Gynecology | Admitting: Obstetrics and Gynecology

## 2022-07-18 ENCOUNTER — Other Ambulatory Visit: Payer: Self-pay

## 2022-07-18 ENCOUNTER — Encounter (HOSPITAL_BASED_OUTPATIENT_CLINIC_OR_DEPARTMENT_OTHER)
Admission: RE | Disposition: A | Discharge status: Home / Self Care | Payer: Self-pay | Source: Home / Self Care | Attending: Obstetrics and Gynecology

## 2022-07-18 ENCOUNTER — Ambulatory Visit (HOSPITAL_BASED_OUTPATIENT_CLINIC_OR_DEPARTMENT_OTHER): Payer: BC Managed Care – PPO | Admitting: Certified Registered Nurse Anesthetist

## 2022-07-18 ENCOUNTER — Telehealth: Payer: Self-pay | Admitting: Obstetrics and Gynecology

## 2022-07-18 DIAGNOSIS — I10 Essential (primary) hypertension: Secondary | ICD-10-CM | POA: Diagnosis not present

## 2022-07-18 DIAGNOSIS — Z01818 Encounter for other preprocedural examination: Secondary | ICD-10-CM

## 2022-07-18 DIAGNOSIS — Z87891 Personal history of nicotine dependence: Secondary | ICD-10-CM | POA: Insufficient documentation

## 2022-07-18 DIAGNOSIS — N368 Other specified disorders of urethra: Secondary | ICD-10-CM | POA: Insufficient documentation

## 2022-07-18 HISTORY — DX: Stress incontinence (female) (male): N39.3

## 2022-07-18 HISTORY — PX: CYSTOSCOPY: SHX5120

## 2022-07-18 HISTORY — PX: URETHRAL CYST REMOVAL: SHX5128

## 2022-07-18 HISTORY — DX: Other specified disorders of urethra: N36.8

## 2022-07-18 HISTORY — DX: Essential (primary) hypertension: I10

## 2022-07-18 HISTORY — DX: Frequency of micturition: R35.0

## 2022-07-18 LAB — POCT I-STAT, CHEM 8
BUN: 24 mg/dL — ABNORMAL HIGH (ref 6–20)
Calcium, Ion: 1.24 mmol/L (ref 1.15–1.40)
Chloride: 103 mmol/L (ref 98–111)
Creatinine, Ser: 0.9 mg/dL (ref 0.44–1.00)
Glucose, Bld: 102 mg/dL — ABNORMAL HIGH (ref 70–99)
HCT: 42 % (ref 36.0–46.0)
Hemoglobin: 14.3 g/dL (ref 12.0–15.0)
Potassium: 4.6 mmol/L (ref 3.5–5.1)
Sodium: 141 mmol/L (ref 135–145)
TCO2: 30 mmol/L (ref 22–32)

## 2022-07-18 LAB — POCT PREGNANCY, URINE: Preg Test, Ur: NEGATIVE

## 2022-07-18 SURGERY — EXCISION, CYST, PERIURETHRAL
Anesthesia: General | Site: Vagina

## 2022-07-18 MED ORDER — MIDAZOLAM HCL 5 MG/5ML IJ SOLN
INTRAMUSCULAR | Status: DC | PRN
  Administered 2022-07-18: 2 mg via INTRAVENOUS

## 2022-07-18 MED ORDER — CEFAZOLIN SODIUM-DEXTROSE 2-4 GM/100ML-% IV SOLN
2.0000 g | INTRAVENOUS | Status: AC
  Administered 2022-07-18: 2 g via INTRAVENOUS

## 2022-07-18 MED ORDER — DEXAMETHASONE SODIUM PHOSPHATE 10 MG/ML IJ SOLN
INTRAMUSCULAR | Status: AC
  Filled 2022-07-18: qty 1

## 2022-07-18 MED ORDER — SODIUM CHLORIDE 0.9 % IR SOLN
Status: DC | PRN
  Administered 2022-07-18: 1000 mL via INTRAVESICAL

## 2022-07-18 MED ORDER — LIDOCAINE 2% (20 MG/ML) 5 ML SYRINGE
INTRAMUSCULAR | Status: DC | PRN
  Administered 2022-07-18: 60 mg via INTRAVENOUS

## 2022-07-18 MED ORDER — LACTATED RINGERS IV SOLN: INTRAVENOUS | Status: DC

## 2022-07-18 MED ORDER — FENTANYL CITRATE (PF) 100 MCG/2ML IJ SOLN
INTRAMUSCULAR | Status: AC
  Filled 2022-07-18: qty 2

## 2022-07-18 MED ORDER — 0.9 % SODIUM CHLORIDE (POUR BTL) OPTIME
TOPICAL | Status: DC | PRN
  Administered 2022-07-18: 1000 mL

## 2022-07-18 MED ORDER — PHENAZOPYRIDINE HCL 100 MG PO TABS
ORAL_TABLET | ORAL | Status: AC
  Filled 2022-07-18: qty 2

## 2022-07-18 MED ORDER — EPHEDRINE 5 MG/ML INJ
INTRAVENOUS | Status: AC
  Filled 2022-07-18: qty 5

## 2022-07-18 MED ORDER — KETOROLAC TROMETHAMINE 30 MG/ML IJ SOLN
INTRAMUSCULAR | Status: DC | PRN
  Administered 2022-07-18: 30 mg via INTRAVENOUS

## 2022-07-18 MED ORDER — HYDROMORPHONE HCL 1 MG/ML IJ SOLN: 0.2500 mg | INTRAMUSCULAR | Status: DC | PRN

## 2022-07-18 MED ORDER — FENTANYL CITRATE (PF) 100 MCG/2ML IJ SOLN
INTRAMUSCULAR | Status: DC | PRN
  Administered 2022-07-18: 50 ug via INTRAVENOUS

## 2022-07-18 MED ORDER — ACETAMINOPHEN 500 MG PO TABS
ORAL_TABLET | ORAL | Status: AC
  Filled 2022-07-18: qty 2

## 2022-07-18 MED ORDER — ONDANSETRON HCL 4 MG/2ML IJ SOLN
INTRAMUSCULAR | Status: DC | PRN
  Administered 2022-07-18: 4 mg via INTRAVENOUS

## 2022-07-18 MED ORDER — LIDOCAINE-EPINEPHRINE 1 %-1:100000 IJ SOLN
INTRAMUSCULAR | Status: DC | PRN
  Administered 2022-07-18: 1 mL

## 2022-07-18 MED ORDER — POVIDONE-IODINE 10 % EX SWAB: 2.0000 | Freq: Once | CUTANEOUS | Status: DC

## 2022-07-18 MED ORDER — KETOROLAC TROMETHAMINE 30 MG/ML IJ SOLN
INTRAMUSCULAR | Status: AC
  Filled 2022-07-18: qty 2

## 2022-07-18 MED ORDER — PROPOFOL 10 MG/ML IV BOLUS
INTRAVENOUS | Status: AC
  Filled 2022-07-18: qty 20

## 2022-07-18 MED ORDER — ONDANSETRON HCL 4 MG/2ML IJ SOLN
INTRAMUSCULAR | Status: AC
  Filled 2022-07-18: qty 2

## 2022-07-18 MED ORDER — LIDOCAINE HCL (PF) 2 % IJ SOLN
INTRAMUSCULAR | Status: AC
  Filled 2022-07-18: qty 5

## 2022-07-18 MED ORDER — DEXAMETHASONE SODIUM PHOSPHATE 10 MG/ML IJ SOLN
INTRAMUSCULAR | Status: DC | PRN
  Administered 2022-07-18: 10 mg via INTRAVENOUS

## 2022-07-18 MED ORDER — GLYCOPYRROLATE PF 0.2 MG/ML IJ SOSY
PREFILLED_SYRINGE | INTRAMUSCULAR | Status: AC
  Filled 2022-07-18: qty 1

## 2022-07-18 MED ORDER — ACETAMINOPHEN 500 MG PO TABS
1000.0000 mg | ORAL_TABLET | Freq: Once | ORAL | Status: AC
  Administered 2022-07-18: 1000 mg via ORAL

## 2022-07-18 MED ORDER — CEFAZOLIN SODIUM-DEXTROSE 2-4 GM/100ML-% IV SOLN
INTRAVENOUS | Status: AC
  Filled 2022-07-18: qty 100

## 2022-07-18 MED ORDER — GLYCOPYRROLATE PF 0.2 MG/ML IJ SOSY
PREFILLED_SYRINGE | INTRAMUSCULAR | Status: DC | PRN
  Administered 2022-07-18: .2 mg via INTRAVENOUS

## 2022-07-18 MED ORDER — PROPOFOL 10 MG/ML IV BOLUS
INTRAVENOUS | Status: DC | PRN
  Administered 2022-07-18: 150 mg via INTRAVENOUS

## 2022-07-18 MED ORDER — PHENAZOPYRIDINE HCL 100 MG PO TABS
200.0000 mg | ORAL_TABLET | ORAL | Status: AC
  Administered 2022-07-18: 200 mg via ORAL

## 2022-07-18 MED ORDER — DIPHENHYDRAMINE HCL 50 MG/ML IJ SOLN
INTRAMUSCULAR | Status: DC | PRN
  Administered 2022-07-18: 12.5 mg via INTRAVENOUS

## 2022-07-18 MED ORDER — EPHEDRINE SULFATE-NACL 50-0.9 MG/10ML-% IV SOSY
PREFILLED_SYRINGE | INTRAVENOUS | Status: DC | PRN
  Administered 2022-07-18 (×3): 5 mg via INTRAVENOUS

## 2022-07-18 MED ORDER — MIDAZOLAM HCL 2 MG/2ML IJ SOLN
INTRAMUSCULAR | Status: AC
  Filled 2022-07-18: qty 2

## 2022-07-18 SURGICAL SUPPLY — 32 items
AGENT HMST KT MTR STRL THRMB (HEMOSTASIS)
BLADE CLIPPER SENSICLIP SURGIC (BLADE) ×3 IMPLANT
BLADE SURG 15 STRL LF DISP TIS (BLADE) ×3 IMPLANT
BLADE SURG 15 STRL SS (BLADE) ×2
ELECT REM PT RETURN 9FT ADLT (ELECTROSURGICAL)
ELECTRODE REM PT RTRN 9FT ADLT (ELECTROSURGICAL) IMPLANT
GAUZE 4X4 16PLY ~~LOC~~+RFID DBL (SPONGE) IMPLANT
GLOVE BIOGEL PI IND STRL 6.5 (GLOVE) ×3 IMPLANT
GLOVE ECLIPSE 6.0 STRL STRAW (GLOVE) ×3 IMPLANT
GOWN STRL REUS W/TWL LRG LVL3 (GOWN DISPOSABLE) ×3 IMPLANT
HIBICLENS CHG 4% 4OZ BTL (MISCELLANEOUS) ×3 IMPLANT
HOLDER FOLEY CATH W/STRAP (MISCELLANEOUS) ×3 IMPLANT
KIT TURNOVER CYSTO (KITS) ×3 IMPLANT
MANIFOLD NEPTUNE II (INSTRUMENTS) ×3 IMPLANT
NDL HYPO 22X1.5 SAFETY MO (MISCELLANEOUS) ×3 IMPLANT
NEEDLE HYPO 22X1.5 SAFETY MO (MISCELLANEOUS) ×2 IMPLANT
NS IRRIG 1000ML POUR BTL (IV SOLUTION) ×3 IMPLANT
PACK CYSTO (CUSTOM PROCEDURE TRAY) ×3 IMPLANT
PACK VAGINAL WOMENS (CUSTOM PROCEDURE TRAY) ×3 IMPLANT
RETRACTOR LONE STAR DISPOSABLE (INSTRUMENTS) ×3 IMPLANT
RETRACTOR STAY HOOK 5MM (MISCELLANEOUS) ×3 IMPLANT
SET IRRIG Y TYPE TUR BLADDER L (SET/KITS/TRAYS/PACK) IMPLANT
SLEEVE SCD COMPRESS KNEE MED (STOCKING) ×3 IMPLANT
SPIKE FLUID TRANSFER (MISCELLANEOUS) IMPLANT
SURGIFLO W/THROMBIN 8M KIT (HEMOSTASIS) IMPLANT
SUT VIC AB 2-0 SH 18 (SUTURE) ×3 IMPLANT
SUT VIC AB 2-0 SH 27 (SUTURE)
SUT VIC AB 2-0 SH 27XBRD (SUTURE) IMPLANT
SUT VICRYL 2-0 SH 8X27 (SUTURE) IMPLANT
SYR BULB EAR ULCER 3OZ GRN STR (SYRINGE) ×3 IMPLANT
TOWEL OR 17X24 6PK STRL BLUE (TOWEL DISPOSABLE) ×3 IMPLANT
TRAY FOLEY W/BAG SLVR 14FR LF (SET/KITS/TRAYS/PACK) ×3 IMPLANT

## 2022-07-18 NOTE — Interval H&P Note (Signed)
History and Physical Interval Note:  07/18/2022 12:03 PM  Megan Cummings  has presented today for surgery, with the diagnosis of other specified disorder of urethra.  The various methods of treatment have been discussed with the patient and family. After consideration of risks, benefits and other options for treatment, the patient has consented to  Procedure(s): EXCISION SUBURETHRAL CYST (N/A) CYSTOSCOPY (N/A) as a surgical intervention.  The patient's history has been reviewed, patient examined, no change in status, stable for surgery.  I have reviewed the patient's chart and labs.  Questions were answered to the patient's satisfaction.     Marguerita Beards

## 2022-07-18 NOTE — Anesthesia Preprocedure Evaluation (Addendum)
Anesthesia Evaluation  Patient identified by MRN, date of birth, ID band Patient awake    Reviewed: Allergy & Precautions, H&P , NPO status , Patient's Chart, lab work & pertinent test results  Airway Mallampati: II  TM Distance: >3 FB Neck ROM: Full    Dental no notable dental hx. (+) Teeth Intact, Dental Advisory Given   Pulmonary former smoker   Pulmonary exam normal breath sounds clear to auscultation       Cardiovascular hypertension, Pt. on medications  Rhythm:Regular Rate:Normal     Neuro/Psych negative neurological ROS  negative psych ROS   GI/Hepatic negative GI ROS, Neg liver ROS,,,  Endo/Other  negative endocrine ROS    Renal/GU negative Renal ROS  negative genitourinary   Musculoskeletal   Abdominal   Peds  Hematology negative hematology ROS (+)   Anesthesia Other Findings   Reproductive/Obstetrics negative OB ROS                             Anesthesia Physical Anesthesia Plan  ASA: 2  Anesthesia Plan: General   Post-op Pain Management: Tylenol PO (pre-op)* and Toradol IV (intra-op)*   Induction: Intravenous  PONV Risk Score and Plan: 4 or greater and Ondansetron, Dexamethasone and Midazolam  Airway Management Planned: LMA  Additional Equipment:   Intra-op Plan:   Post-operative Plan: Extubation in OR  Informed Consent: I have reviewed the patients History and Physical, chart, labs and discussed the procedure including the risks, benefits and alternatives for the proposed anesthesia with the patient or authorized representative who has indicated his/her understanding and acceptance.     Dental advisory given  Plan Discussed with: CRNA  Anesthesia Plan Comments:        Anesthesia Quick Evaluation

## 2022-07-18 NOTE — Transfer of Care (Signed)
Immediate Anesthesia Transfer of Care Note  Patient: Megan Cummings  Procedure(s) Performed: EXCISION SUBURETHRAL CYST (Vagina ) CYSTOSCOPY (Bladder)  Patient Location: PACU  Anesthesia Type:General  Level of Consciousness: drowsy, patient cooperative, and responds to stimulation  Airway & Oxygen Therapy: Patient Spontanous Breathing  Post-op Assessment: Report given to RN and Post -op Vital signs reviewed and stable  Post vital signs: Reviewed and stable  Last Vitals:  Vitals Value Taken Time  BP 145/77 07/18/22 1320  Temp    Pulse 86 07/18/22 1322  Resp 15 07/18/22 1322  SpO2 100 % 07/18/22 1322  Vitals shown include unvalidated device data.  Last Pain:  Vitals:   07/18/22 0925  TempSrc: Oral  PainSc: 0-No pain      Patients Stated Pain Goal: 6 (07/18/22 0925)  Complications: No notable events documented.

## 2022-07-18 NOTE — Anesthesia Postprocedure Evaluation (Signed)
Anesthesia Post Note  Patient: Megan Cummings  Procedure(s) Performed: EXCISION SUBURETHRAL CYST (Vagina ) CYSTOSCOPY (Bladder)     Patient location during evaluation: PACU Anesthesia Type: General Level of consciousness: awake and alert Pain management: pain level controlled Vital Signs Assessment: post-procedure vital signs reviewed and stable Respiratory status: spontaneous breathing, nonlabored ventilation and respiratory function stable Cardiovascular status: blood pressure returned to baseline and stable Postop Assessment: no apparent nausea or vomiting Anesthetic complications: no  No notable events documented.  Last Vitals:  Vitals:   07/18/22 1330 07/18/22 1345  BP: 137/85 128/73  Pulse: 84 60  Resp: 17 13  Temp:    SpO2: 98% 98%    Last Pain:  Vitals:   07/18/22 1330  TempSrc:   PainSc: 0-No pain                 Sadiyah Kangas,W. EDMOND

## 2022-07-18 NOTE — Telephone Encounter (Signed)
Megan Cummings underwent excision of suburethral cyst, cystoscopy on 07/18/22.   She passed her voiding trial.  was backfilled into the bladder Voided  PVR by bladder scan was 43ml.   She was discharged without a catheter. Please call her for a routine post op check. Thanks!  Marguerita Beards, MD

## 2022-07-18 NOTE — Op Note (Signed)
Operative Note  Preoperative Diagnosis:  suburethral cyst  Postoperative Diagnosis: same  Procedures performed:  Excision of suburethral cyst, cystoscopy  Implants: none  Attending Surgeon: Lanetta Inch, MD  Anesthesia: General LMA  Findings: 1. On exam, 1cm cyst with stones just under urethral meatus   2. On csytoscopy, normal bladder and urethra without injury, lesion or foreign body. Brisk bilateral ureteral efflux noted  Specimens:  ID Type Source Tests Collected by Time Destination  1 : suburethral cyst Tissue PATH Gyn benign resection SURGICAL PATHOLOGY Marguerita Beards, MD 07/18/2022 1244     Estimated blood loss: 10 mL  IV fluids: 50 mL  Urine output: 600 mL  Complications: none  Procedure in Detail:  After informed consent was obtained the patient was taken to the operating room where general anesthesia was induced and found to be adequate.  She was placed in dorsolithotomy position taking care to avoid any nerve injury and prepped and draped in the usual sterile fashion.  Foley catheter was placed and a [place] retractor was placed.  A 1cm cyst was noted under the urethra, midline but slightly to the right. Stones could be palpated within the cyst. The vaginal epithelium was injected with 1% lidocaine with epinephrine.  Incision was made with a 15 blade scalpel adjacent to the cyst.  Dissection was performed with Metzenbaum scissors to gently dissect the epithelium off of the cyst wall. The periurethral tissue was excised off of the cyst until it reached the urethra. Cystourethroscopy  was was then performed with 70 cystoscope and a 360 degree view of the bladder was obtained with no evidence of injury or lesions, and brisk ureteral efflux was noted. The cystoscope was slowly removed while visualizing the urethra no ostia was noted on the urethral wall. The foley catheter was replaced. The cyst was incised with scissors and multiple small round yellow stones  were removed. There was no connection noted with the urethra. The cyst wall was then removed with scissors. A small amount of the posterior cyst wall was left as it was strongly adhered to the urethral surface.  Repeat cystoscopy was then performed and bladder and urethra were again noted to be intact.  Foley was replaced. The periurethral tissue as then was then closed with a 2-0 Vicryl suture.  The vaginal epithelium was then closed with a 2-0 Vicryl suture in a running fashion. Hemostasis was noted.  The patient tolerated the procedure well and she was awoken and taken to the recovery room in stable condition.  Needle and sponge count was correct x 2.   Marguerita Beards, MD

## 2022-07-18 NOTE — Discharge Instructions (Addendum)
POST OPERATIVE INSTRUCTIONS  General Instructions Recovery (not bed rest) will last approximately 6 weeks Walking is encouraged, but refrain from strenuous exercise/ housework/ heavy lifting for 2 weeks. No lifting >10lbs  Nothing in the vagina- NO intercourse, tampons or douching for 6 weeks Bathing:  Do not submerge in water (NO swimming, bath, hot tub, etc) until after your postop visit. You can shower starting the day after surgery.  No driving until you are not taking narcotic pain medicine and until your pain is well enough controlled that you can slam on the breaks or make sudden movements if needed.   Taking your medications Please take your acetaminophen and ibuprofen on a schedule for the first 48 hours. Take '600mg'$  ibuprofen, then take '500mg'$  acetaminophen 3 hours later, then continue to alternate ibuprofen and acetaminophen. That way you are taking each type of medication every 6 hours. Take the prescribed narcotic (oxycodone, tramadol, etc) as needed, with a maximum being every 4 hours.  Take a stool softener daily to keep your stools soft and preventing you from straining. If you have diarrhea, you decrease your stool softener. This is explained more below. We have prescribed you Miralax.  Reasons to Call the Nurse (see last page for phone numbers) Heavy Bleeding (changing your pad every 1-2 hours) Persistent nausea/vomiting Fever (100.4 degrees or more) Incision problems (pus or other fluid coming out, redness, warmth, increased pain)  Things to Expect After Surgery Mild to Moderate pain is normal during the first day or two after surgery. If prescribed, take Ibuprofen or Tylenol first and use the stronger medicine for "break-through" pain. You can overlap these medicines because they work differently.   Constipation   To Prevent Constipation:  Eat a well-balanced diet including protein, grains, fresh fruit and vegetables.  Drink plenty of fluids. Walk regularly.  Depending on  specific instructions from your physician: take Miralax daily and additionally you can add a stool softener (colace/ docusate) and fiber supplement. Continue as long as you're on pain medications.   To Treat Constipation:  If you do not have a bowel movement in 2 days after surgery, you can take 2 Tbs of Milk of Magnesia 1-2 times a day until you have a bowel movement. If diarrhea occurs, decrease the amount or stop the laxative. If no results with Milk of Magnesia, you can drink a bottle of magnesium citrate which you can purchase over the counter.  Fatigue:  This is a normal response to surgery and will improve with time.  Plan frequent rest periods throughout the day.  Gas Pain:  This is very common but can also be very painful! Drink warm liquids such as herbal teas, bouillon or soup. Walking will help you pass more gas.  Mylicon or Gas-X can be taken over the counter.  Leaking Urine:  Varying amounts of leakage may occur after surgery.  This should improve with time. Your bladder needs at least 3 months to recover from surgery. If you leak after surgery, be sure to mention this to your doctor at your post-op visit. If you were taking medications for overactive bladder prior to surgery, be sure to restart the medications immediately after surgery.  Incisions: If you have incisions on your abdomen, the skin glue will dissolve on its own over time. It is ok to gently rinse with soap and water over these incisions but do not scrub.  Catheter Approximately 50% of patients are unable to urinate after surgery and need to go home with a  catheter. This allows your bladder to rest so it can return to full function. If you go home with a catheter, the office will call to set up a voiding trial a few days after surgery. For most patients, by this visit, they are able to urinate on their own. Long term catheter use is rare.   Return to Work  As work demands and recovery times vary widely, it is hard to  predict when you will want to return to work. If you have a desk job with no strenuous physical activity, and if you would like to return sooner than generally recommended, discuss this with your provider or call our office.   Post op concerns  For non-emergent issues, please call the Urogynecology Nurse. Please leave a message and someone will contact you within one business day.  You can also send a message through MyChart.   AFTER HOURS (After 5:00 PM and on weekends):  For urgent matters that cannot wait until the next business day. Call our office [phone] and connect to the doctor on call.  Please reserve this for important issues.   **FOR ANY TRUE EMERGENCY ISSUES CALL 911 OR GO TO THE NEAREST EMERGENCY ROOM.** Please inform our office or the doctor on call of any emergency.     APPOINTMENTS: Call [phone]    CYSTOSCOPY HOME CARE INSTRUCTIONS  Activity: Rest for the remainder of the day.  Do not drive or operate equipment today.  You may resume normal activities in one to two days as instructed by your physician.   Meals: Drink plenty of liquids and eat light foods such as gelatin or soup this evening.  You may return to a normal meal plan tomorrow.  Return to Work: You may return to work in one to two days or as instructed by your physician.  Special Instructions / Symptoms: Call your physician if any of these symptoms occur:   -persistent or heavy bleeding  -bleeding which continues after first few urination  -large blood clots that are difficult to pass  -urine stream diminishes or stops completely  -fever equal to or higher than 101 degrees Farenheit.  -cloudy urine with a strong, foul odor  -severe pain  Females should always wipe from front to back after elimination.  You may feel some burning pain when you urinate.  This should disappear with time.  Applying moist heat to the lower abdomen or a hot tub bath may help relieve the pain.  \            No acetaminophen/Tylenol until after 3:28 pm today if needed.   No ibuprofen, Advil, Aleve, Motrin, ketorolac, meloxicam, naproxen, or other NSAIDS until after 7:10 pm today if needed.      Post Anesthesia Home Care Instructions  Activity: Get plenty of rest for the remainder of the day. A responsible individual must stay with you for 24 hours following the procedure.  For the next 24 hours, DO NOT: -Drive a car -Advertising copywriter -Drink alcoholic beverages -Take any medication unless instructed by your physician -Make any legal decisions or sign important papers.  Meals: Start with liquid foods such as gelatin or soup. Progress to regular foods as tolerated. Avoid greasy, spicy, heavy foods. If nausea and/or vomiting occur, drink only clear liquids until the nausea and/or vomiting subsides. Call your physician if vomiting continues.  Special Instructions/Symptoms: Your throat may feel dry or sore from the anesthesia or the breathing tube placed in your throat during surgery. If  this causes discomfort, gargle with warm salt water. The discomfort should disappear within 24 hours.  If you had a scopolamine patch placed behind your ear for the management of post- operative nausea and/or vomiting:  1. The medication in the patch is effective for 72 hours, after which it should be removed.  Wrap patch in a tissue and discard in the trash. Wash hands thoroughly with soap and water. 2. You may remove the patch earlier than 72 hours if you experience unpleasant side effects which may include dry mouth, dizziness or visual disturbances. 3. Avoid touching the patch. Wash your hands with soap and water after contact with the patch.

## 2022-07-18 NOTE — Anesthesia Procedure Notes (Signed)
Procedure Name: LMA Insertion Date/Time: 07/18/2022 12:30 PM  Performed by: Bishop Limbo, CRNAPre-anesthesia Checklist: Patient identified, Emergency Drugs available, Suction available and Patient being monitored Patient Re-evaluated:Patient Re-evaluated prior to induction Oxygen Delivery Method: Circle System Utilized Preoxygenation: Pre-oxygenation with 100% oxygen Induction Type: IV induction Ventilation: Mask ventilation without difficulty LMA: LMA inserted LMA Size: 4.0 Number of attempts: 1 Airway Equipment and Method: Bite block Placement Confirmation: positive ETCO2 Tube secured with: Tape Dental Injury: Teeth and Oropharynx as per pre-operative assessment

## 2022-07-19 LAB — SURGICAL PATHOLOGY

## 2022-07-19 NOTE — Telephone Encounter (Signed)
Called and spoke to patient on the phone. Patient reports she is taking tylenol and ibuprofen. Rates pain 4/10 when urinating  Patient reports she has not yet had a bowel movement Reports bleeding stopped yesterday.  Denies any issues with urination.  Overall she reports she feels great and will call the office if she has any concerns.

## 2022-07-20 ENCOUNTER — Encounter (HOSPITAL_BASED_OUTPATIENT_CLINIC_OR_DEPARTMENT_OTHER): Payer: Self-pay | Admitting: Obstetrics and Gynecology

## 2022-07-22 ENCOUNTER — Telehealth: Payer: Self-pay

## 2022-07-22 NOTE — Telephone Encounter (Signed)
Patient called stating she has blood in her urine even when she pats completely dry. Denies heavy bleeding or blood clots. Patient was encourage to contact the office if she starts heavy bleeding.

## 2022-08-02 ENCOUNTER — Encounter: Payer: Self-pay | Admitting: Obstetrics and Gynecology

## 2022-08-02 ENCOUNTER — Ambulatory Visit (INDEPENDENT_AMBULATORY_CARE_PROVIDER_SITE_OTHER): Payer: BC Managed Care – PPO | Admitting: Obstetrics and Gynecology

## 2022-08-02 VITALS — BP 164/104 | HR 78

## 2022-08-02 DIAGNOSIS — Z9889 Other specified postprocedural states: Secondary | ICD-10-CM

## 2022-08-02 NOTE — Progress Notes (Signed)
[facility]  Date of Visit: 08/02/2022  History of Present Illness: Ms. Lasseter is a 47 y.o. female scheduled today for a post-operative visit.   Surgery: s/p excision of suburethral cyst, cystoscopy on 07/18/22  She passed her postoperative void trial.   Postoperative course has been uncomplicated.   Today she reports she is feeling well. Does not have any pain.   UTI symptoms? No  Pain? No  She has returned to her normal activity. Stress incontinence: No  Urgency/frequency: No  Urge incontinence: No  Voiding dysfunction: No  Bowel issues: No    Pathology results: SUBURETHRAL CYST, EXCISION:  Benign squamous mucosa.  Negative for dysplasia or malignancy.   Medications: She has a current medication list which includes the following prescription(s): acetaminophen, anastrozole, calcium carb-cholecalciferol, hydrocodone-acetaminophen, ibuprofen, krill oil, lorazepam, magnesium, meloxicam, oxycodone, and valsartan, and the following Facility-Administered Medications: goserelin.   Allergies: Patient is allergic to sulfa antibiotics.   Physical Exam: BP (!) 164/104   Pulse 78    Pelvic Examination: Vagina: sutures present in the suburethral space. Small separation present but appears to be healing well.    ---------------------------------------------------------  Assessment and Plan:  1. Post-operative state     - Due to small tissue separation, we discussed avoiding more vigorous activity (running, heavy weight lifting). Although can continue exercise, just low impact. Continue pelvic rest.  - Reviewed results of pathology- benign.   Return 4 weeks for post op visit.   Marguerita Beards, MD

## 2022-08-30 ENCOUNTER — Encounter: Payer: Self-pay | Admitting: Oncology

## 2022-08-30 ENCOUNTER — Inpatient Hospital Stay: Payer: BC Managed Care – PPO | Attending: Oncology | Admitting: Oncology

## 2022-08-30 ENCOUNTER — Encounter: Payer: Self-pay | Admitting: Family Medicine

## 2022-08-30 ENCOUNTER — Inpatient Hospital Stay: Payer: BC Managed Care – PPO

## 2022-08-30 VITALS — BP 143/80 | HR 64 | Temp 97.2°F | Resp 18 | Ht 69.0 in | Wt 170.1 lb

## 2022-08-30 DIAGNOSIS — Z170 Estrogen receptor positive status [ER+]: Secondary | ICD-10-CM | POA: Insufficient documentation

## 2022-08-30 DIAGNOSIS — C50912 Malignant neoplasm of unspecified site of left female breast: Secondary | ICD-10-CM | POA: Insufficient documentation

## 2022-08-30 DIAGNOSIS — Z5181 Encounter for therapeutic drug level monitoring: Secondary | ICD-10-CM

## 2022-08-30 DIAGNOSIS — E2839 Other primary ovarian failure: Secondary | ICD-10-CM | POA: Diagnosis not present

## 2022-08-30 DIAGNOSIS — Z79811 Long term (current) use of aromatase inhibitors: Secondary | ICD-10-CM | POA: Diagnosis not present

## 2022-08-30 MED ORDER — LEUPROLIDE ACETATE 3.75 MG IM KIT
11.2500 mg | PACK | INTRAMUSCULAR | Status: DC
  Administered 2022-08-30: 11.25 mg via INTRAMUSCULAR
  Filled 2022-08-30: qty 11.25

## 2022-08-30 NOTE — Progress Notes (Unsigned)
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  Mammogram showed innumerable cysts in the left breast the largest of which was 1.2 x 1.1 x 1.4 cm.  Targeted ultrasound of the inner left breast showed a hypoechoic mass measuring 1 x 0.8 x 1.9 cm.  No definite lymphadenopathy noted in the left axilla.  No suspicious findings in the right breast.  She has had her last mammogram in 2019 when was again noted to have bilateral breast cysts and subsequent ultrasounds showed benign cysts with no suspicious lesions.Ultrasound-guided biopsy of the left breast mass showed invasive mammary carcinoma 6 mm grade 1 ER 81 to 90% positive PR 91 200% positive and HER2 equivocal by IHC and FISH testing negative   Menarche at the age of 47.  She is G1, P1 L1.  No use of birth control.  No prior hysterectomy.  She is still premenopausal and gets her menstrual cycles regularly.  Family history significant for colon cancer in paternal grandmother and lung cancer or lymphoma in maternal grandfather.   Final lumpectomy pathology showed 13 mm grade 1 invasive mammary carcinoma with negative margins.  3 sentinel lymph nodes negative for malignancy.  No  lymphovascular invasion.  Oncotype score came back at 16And chemotherapy benefit for this score in patients less than 24 years of age would be ~1.6%.  Adjuvant chemotherapy was not recommended.  Patient completed adjuvant radiation treatment.  Patient is on ovarian suppression with Zoladex plus Arimidex started in April 2023.    Interval history-patient is tolerating Arimidex well along with ovarian suppression.  She continues to be active.  She is on valsartan for hypertension which may need further titration.  Denies any breast concerns at this time  ECOG PS- 0 Pain scale- 0   Review of systems- Review of Systems  Constitutional:  Negative for chills, fever, malaise/fatigue and weight loss.  HENT:  Negative for congestion, ear discharge and nosebleeds.   Eyes:  Negative for blurred vision.  Respiratory:  Negative for cough, hemoptysis, sputum production, shortness of breath and wheezing.   Cardiovascular:  Negative for chest pain, palpitations, orthopnea and claudication.  Gastrointestinal:  Negative for abdominal pain, blood in stool, constipation, diarrhea, heartburn, melena, nausea and vomiting.  Genitourinary:  Negative for dysuria, flank pain, frequency, hematuria and urgency.  Musculoskeletal:  Negative for back pain, joint pain and myalgias.  Skin:  Negative for rash.  Neurological:  Negative for dizziness, tingling, focal weakness, seizures, weakness and headaches.  Endo/Heme/Allergies:  Does not bruise/bleed easily.  Psychiatric/Behavioral:  Negative for depression and suicidal ideas. The patient does not have insomnia.       Allergies  Allergen Reactions   Sulfa Antibiotics Rash     Past Medical History:  Diagnosis Date   Family history of colon cancer    Family history of lung cancer  Fibrocystic breast    History of basal cell carcinoma (BCC) excision 12/06/2016   09/ 2018 Right forearm. Superficial. ;  06/ 2017 left anterior shoulder and left upper back    History of kidney stones    Hypertension    Malignant neoplasm of breast, stage 1, estrogen receptor positive, left (HCC) 09/2020   oncologist--- dr a. Elmin Wiederholt/  radiation oncologist-- dr g. Rushie Chestnut;  dx 06/ 2022  invasive mammary carcinoma, ER/PR +;    09-25-2020  s/p left partial mastectomy w/ node dissection;  completed radiation 04-06-2021   Personal history of radiation therapy    left breast  03-01-2021  to 04-06-2021   Suburethral cyst    SUI (stress urinary incontinence, female)    Umbilical hernia 10/2015     Past Surgical History:  Procedure Laterality Date   COLONOSCOPY N/A 11/18/2020   Procedure: COLONOSCOPY;  Surgeon: Pasty Spillers, MD;  Location: [facility];  Service: Endoscopy;  Laterality: N/A;   CYSTOSCOPY N/A 07/18/2022   Procedure: CYSTOSCOPY;  Surgeon: Marguerita Beards, MD;  Location: [facility];  Service: Gynecology;  Laterality: N/A;   FOOT SURGERY Right 07/04/2010   great toe   PART MASTECTOMY,RADIO FREQUENCY LOCALIZER,AXILLARY SENTINEL NODE BIOPSY Left 01/25/2021   Procedure: PART MASTECTOMY,RADIO FREQUENCY LOCALIZER,AXILLARY SENTINEL NODE BIOPSY;  Surgeon: Carolan Shiver, MD;  Location: [facility];  Service: General;  Laterality: Left;   URETHRAL CYST REMOVAL N/A 07/18/2022   Procedure: EXCISION SUBURETHRAL CYST;  Surgeon: Marguerita Beards, MD;  Location: [facility];  Service: Gynecology;  Laterality: N/A;    Social History   Socioeconomic History   Marital status: Married    Spouse name: Not on file   Number of children: Not on file   Years of education: Not on file   Highest education level: Not on file  Occupational History   Not on file  Tobacco Use   Smoking status: Former    Packs/day: 0.50    Years: 10.00    Additional pack years: 0.00    Total pack years: 5.00    Types: Cigarettes    Quit date: 2005    Years since quitting: 19.4   Smokeless tobacco: Never  Vaping Use   Vaping Use:  Never used  Substance and Sexual Activity   Alcohol use: Yes    Comment: occasional   Drug use: Not Currently    Comment: 07-12-2022  per pt last used approx 20003   Sexual activity: Yes    Birth control/protection: None  Other Topics Concern   Not on file  Social History Narrative   Not on file   Social Determinants of Health   Financial Resource Strain: Not on file  Food Insecurity: Not on file  Transportation Needs: Not on file  Physical Activity: Not on file  Stress: Not on file  Social Connections: Not on file  Intimate Partner Violence: Not on file    Family History  Problem Relation Age of Onset   Healthy Mother    Hyperlipidemia Father    Healthy Brother    Lung cancer Maternal Uncle    Healthy Maternal Grandmother    Emphysema Maternal Grandfather    Colon cancer Paternal Grandmother    Lung cancer Paternal Grandfather    Healthy Son    Breast cancer Neg Hx      Current Outpatient Medications:    acetaminophen (TYLENOL) 500 MG tablet, Take 1 tablet (500 mg total) by mouth every 6 (six) hours as  needed (pain)., Disp: 30 tablet, Rfl: 0   anastrozole (ARIMIDEX) 1 MG tablet, TAKE 1 TABLET(1 MG) BY MOUTH DAILY. START AFTER HAVING 3 DOSES OF GOSERELIN INJECT (Patient taking differently: Take 1 mg by mouth at bedtime.), Disp: 90 tablet, Rfl: 0   Calcium Carb-Cholecalciferol (CALCIUM 600 + D PO), Take 1 tablet by mouth daily., Disp: , Rfl:    HYDROcodone-acetaminophen (NORCO/VICODIN) 5-325 MG tablet, Take by mouth., Disp: , Rfl:    ibuprofen (ADVIL) 600 MG tablet, Take 1 tablet (600 mg total) by mouth every 6 (six) hours as needed., Disp: 30 tablet, Rfl: 0   KRILL OIL PO, Take 1,000 mg by mouth daily., Disp: , Rfl:    LORazepam (ATIVAN) 0.5 MG tablet, TAKE 1 TABLET(0.5 MG) BY MOUTH TWICE DAILY AS NEEDED FOR ANXIETY (Patient taking differently: Take 0.5 mg by mouth 2 (two) times daily as needed for anxiety.), Disp: 30 tablet, Rfl: 0   magnesium 30 MG tablet, Take 30 mg  by mouth 2 (two) times daily., Disp: , Rfl:    meloxicam (MOBIC) 15 MG tablet, Take 15 mg by mouth daily as needed for pain., Disp: , Rfl:    oxyCODONE (OXY IR/ROXICODONE) 5 MG immediate release tablet, Take 1 tablet (5 mg total) by mouth every 4 (four) hours as needed for severe pain., Disp: 5 tablet, Rfl: 0   valsartan (DIOVAN) 40 MG tablet, Take 1 tablet (40 mg total) by mouth daily. (Patient taking differently: Take 40 mg by mouth at bedtime.), Disp: 30 tablet, Rfl: 2 No current facility-administered medications for this visit.  Facility-Administered Medications Ordered in Other Visits:    goserelin (ZOLADEX) injection 10.8 mg, 10.8 mg, Subcutaneous, Q90 days, Creig Hines, MD, 10.8 mg at 03/03/22 1146   leuprolide (LUPRON) injection 11.25 mg, 11.25 mg, Intramuscular, Q90 days, Creig Hines, MD, 11.25 mg at 08/30/22 1456  Physical exam:  Vitals:   08/30/22 1415 08/30/22 1419  BP: (!) 151/74 (!) 143/80  Pulse: 67 64  Resp: 18   Temp: (!) 97.2 F (36.2 C)   TempSrc: Tympanic   SpO2: 100%   Weight: 170 lb 1.6 oz (77.2 kg)   Height: 5\' 9"  (1.753 m)    Physical Exam Cardiovascular:     Rate and Rhythm: Normal rate and regular rhythm.     Heart sounds: Normal heart sounds.  Pulmonary:     Effort: Pulmonary effort is normal.     Breath sounds: Normal breath sounds.  Skin:    General: Skin is warm and dry.  Neurological:     Mental Status: She is alert and oriented to person, place, and time.    Breast exam was performed in seated and lying down position. Patient is status post left lumpectomy with a well-healed surgical scar. No evidence of any palpable masses. No evidence of axillary adenopathy. No evidence of any palpable masses or lumps in the right breast. No evidence of right axillary adenopathy      Latest Ref Rng & Units 07/18/2022    9:38 AM  CMP  Glucose 70 - 99 mg/dL 161   BUN 6 - 20 mg/dL 24   Creatinine 0.96 - 1.00 mg/dL 0.45   Sodium 409 - 811 mmol/L 141    Potassium 3.5 - 5.1 mmol/L 4.6   Chloride 98 - 111 mmol/L 103       Latest Ref Rng & Units 07/18/2022    9:38 AM  CBC  Hemoglobin 12.0 - 15.0 g/dL 91.4   Hematocrit 78.2 -  46.0 % 42.0      Assessment and plan- Patient is a 47 y.o. female with 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  She is here for routine follow-up of following issues:  Patient will receive her next dose of Zoladex today and will continue to receive this every 3 months.  We had tried stopping her ovarian suppression but her levels did come back premenopausal and she did have some spotting as well.  Therefore she would be considered premenopausal still.  We discussed whether she would like to stop ovarian suppression and go with tamoxifen alone but she is okay with continuing Zoladex plus Arimidex for now.  Patient will be due for bone density scan in November 2024 and mammogram in October 2020 for which she will schedule.  I will see her back in 6 months with no labs   Visit Diagnosis 1. Malignant neoplasm of breast, stage 1, estrogen receptor positive, left (HCC)   2. Suppression of ovarian secretion   3. Visit for monitoring Arimidex therapy      Dr. Owens Shark, MD, MPH [facility] [phone] 08/30/2022 3:01 PM

## 2022-08-31 ENCOUNTER — Encounter: Payer: Self-pay | Admitting: Obstetrics and Gynecology

## 2022-08-31 ENCOUNTER — Other Ambulatory Visit: Payer: Self-pay | Admitting: Family Medicine

## 2022-08-31 ENCOUNTER — Ambulatory Visit (INDEPENDENT_AMBULATORY_CARE_PROVIDER_SITE_OTHER): Payer: BC Managed Care – PPO | Admitting: Obstetrics and Gynecology

## 2022-08-31 VITALS — BP 154/84 | HR 64

## 2022-08-31 DIAGNOSIS — Z9889 Other specified postprocedural states: Secondary | ICD-10-CM

## 2022-08-31 MED ORDER — VALSARTAN 80 MG PO TABS
80.0000 mg | ORAL_TABLET | Freq: Every day | ORAL | 0 refills | Status: DC
Start: 2022-08-31 — End: 2022-11-29

## 2022-08-31 NOTE — Progress Notes (Signed)
[facility]  Date of Visit: 08/31/2022  History of Present Illness: Megan Cummings is a 47 y.o. female scheduled today for a post-operative visit.   Surgery: s/p excision of suburethral cyst, cystoscopy on 07/18/22  She passed her postoperative void trial.   Postoperative course has been uncomplicated.   Today she reports she is feeling well. Does not have any pain.   UTI symptoms? No  Pain? No  She has returned to her normal activity. Stress incontinence: No  Urgency/frequency: No  Urge incontinence: No  Voiding dysfunction: No  Bowel issues: No    Pathology results: SUBURETHRAL CYST, EXCISION:  Benign squamous mucosa.  Negative for dysplasia or malignancy.   Medications: She has a current medication list which includes the following prescription(s): acetaminophen, anastrozole, calcium carb-cholecalciferol, hydrocodone-acetaminophen, ibuprofen, krill oil, lorazepam, magnesium, meloxicam, oxycodone, and valsartan, and the following Facility-Administered Medications: goserelin and leuprolide.   Allergies: Patient is allergic to sulfa antibiotics.   Physical Exam: BP (!) 154/84   Pulse 64    Pelvic Examination: Vagina: sutures present in the suburethral space. Small separation present but appears to be healing well.    ---------------------------------------------------------  Assessment and Plan:  1. Post-operative state    - Previously had a small amount of tissue separation, not present on exam today.  -Sutures are dissolved with no visible sutures present -Cleared for intercourse, swimming, and hot tub as needed.   Return PRN   Selmer Dominion, NP

## 2022-08-31 NOTE — Patient Instructions (Signed)
Sutures have dissolved and healing looks great.   Cleared for intercourse, hot tubs, and swimming.

## 2022-09-01 ENCOUNTER — Encounter: Payer: Self-pay | Admitting: Oncology

## 2022-09-03 ENCOUNTER — Other Ambulatory Visit: Payer: Self-pay | Admitting: Family Medicine

## 2022-09-05 NOTE — Telephone Encounter (Signed)
Med dc'd 08/31/22 Hurshel Keys FNP  Requested Prescriptions  Refused Prescriptions Disp Refills   valsartan (DIOVAN) 40 MG tablet [Pharmacy Med Name: VALSARTAN 40MG  TABLETS] 30 tablet 2    Sig: TAKE 1 TABLET(40 MG) BY MOUTH DAILY     Cardiovascular:  Angiotensin Receptor Blockers Failed - 09/03/2022  3:34 AM      Failed - Last BP in normal range    BP Readings from Last 1 Encounters:  08/31/22 (!) 154/84         Passed - Cr in normal range and within 180 days    Creatinine, Ser  Date Value Ref Range Status  07/18/2022 0.90 0.44 - 1.00 mg/dL Final         Passed - K in normal range and within 180 days    Potassium  Date Value Ref Range Status  07/18/2022 4.6 3.5 - 5.1 mmol/L Final         Passed - Patient is not pregnant      Passed - Valid encounter within last 6 months    Recent Outpatient Visits           5 months ago Annual physical exam   [facility] Jacky Kindle, FNP   1 year ago Annual physical exam   [facility] Jacky Kindle, FNP   4 years ago Annual physical exam   [facility] Chrismon, Jodell Cipro, PA-C   5 years ago Annual physical exam   [facility] Chrismon, Jodell Cipro, PA-C   5 years ago Rosacea, acne   [facility] Chrismon, Jodell Cipro, PA-C       Future Appointments             In 3 days Willeen Niece, MD [facility]

## 2022-09-08 ENCOUNTER — Ambulatory Visit (INDEPENDENT_AMBULATORY_CARE_PROVIDER_SITE_OTHER): Payer: BC Managed Care – PPO | Admitting: Dermatology

## 2022-09-08 DIAGNOSIS — D225 Melanocytic nevi of trunk: Secondary | ICD-10-CM

## 2022-09-08 DIAGNOSIS — W908XXA Exposure to other nonionizing radiation, initial encounter: Secondary | ICD-10-CM

## 2022-09-08 DIAGNOSIS — Z85828 Personal history of other malignant neoplasm of skin: Secondary | ICD-10-CM

## 2022-09-08 DIAGNOSIS — L578 Other skin changes due to chronic exposure to nonionizing radiation: Secondary | ICD-10-CM

## 2022-09-08 DIAGNOSIS — L820 Inflamed seborrheic keratosis: Secondary | ICD-10-CM

## 2022-09-08 DIAGNOSIS — L814 Other melanin hyperpigmentation: Secondary | ICD-10-CM

## 2022-09-08 DIAGNOSIS — X32XXXA Exposure to sunlight, initial encounter: Secondary | ICD-10-CM

## 2022-09-08 DIAGNOSIS — D2271 Melanocytic nevi of right lower limb, including hip: Secondary | ICD-10-CM

## 2022-09-08 DIAGNOSIS — D229 Melanocytic nevi, unspecified: Secondary | ICD-10-CM

## 2022-09-08 NOTE — Patient Instructions (Addendum)
Cryotherapy Aftercare  Wash gently with soap and water everyday.   Apply Vaseline and Band-Aid daily until healed.    Melanoma ABCDEs  Melanoma is the most dangerous type of skin cancer, and is the leading cause of death from skin disease.  You are more likely to develop melanoma if you: Have light-colored skin, light-colored eyes, or red or blond hair Spend a lot of time in the sun Tan regularly, either outdoors or in a tanning bed Have had blistering sunburns, especially during childhood Have a close family member who has had a melanoma Have atypical moles or large birthmarks  Early detection of melanoma is key since treatment is typically straightforward and cure rates are extremely high if we catch it early.   The first sign of melanoma is often a change in a mole or a new dark spot.  The ABCDE system is a way of remembering the signs of melanoma.  A for asymmetry:  The two halves do not match. B for border:  The edges of the growth are irregular. C for color:  A mixture of colors are present instead of an even brown color. D for diameter:  Melanomas are usually (but not always) greater than 6mm - the size of a pencil eraser. E for evolution:  The spot keeps changing in size, shape, and color.  Please check your skin once per month between visits. You can use a small mirror in front and a large mirror behind you to keep an eye on the back side or your body.   If you see any new or changing lesions before your next follow-up, please call to schedule a visit.  Please continue daily skin protection including broad spectrum sunscreen SPF 30+ to sun-exposed areas, reapplying every 2 hours as needed when you're outdoors.    Due to recent changes in healthcare laws, you may see results of your pathology and/or laboratory studies on MyChart before the doctors have had a chance to review them. We understand that in some cases there may be results that are confusing or concerning to you.  Please understand that not all results are received at the same time and often the doctors may need to interpret multiple results in order to provide you with the best plan of care or course of treatment. Therefore, we ask that you please give us 2 business days to thoroughly review all your results before contacting the office for clarification. Should we see a critical lab result, you will be contacted sooner.   If You Need Anything After Your Visit  If you have any questions or concerns for your doctor, please call our main line at [phone] and press option 4 to reach your doctor's medical assistant. If no one answers, please leave a voicemail as directed and we will return your call as soon as possible. Messages left after 4 pm will be answered the following business day.   You may also send us a message via MyChart. We typically respond to MyChart messages within 1-2 business days.  For prescription refills, please ask your pharmacy to contact our office. Our fax number is [phone].  If you have an urgent issue when the clinic is closed that cannot wait until the next business day, you can page your doctor at the number below.    Please note that while we do our best to be available for urgent issues outside of office hours, we are not available 24/7.   If you have an urgent   issue and are unable to reach us, you may choose to seek medical care at your doctor's office, retail clinic, urgent care center, or emergency room.  If you have a medical emergency, please immediately call 911 or go to the emergency department.  Pager Numbers  - Dr. Kowalski: [phone]  - Dr. Moye: [phone]  - Dr. Stewart: [phone]  In the event of inclement weather, please call our main line at [phone] for an update on the status of any delays or closures.  Dermatology Medication Tips: Please keep the boxes that topical medications come in in order to help keep track of the  instructions about where and how to use these. Pharmacies typically print the medication instructions only on the boxes and not directly on the medication tubes.   If your medication is too expensive, please contact our office at [phone] option 4 or send us a message through MyChart.   We are unable to tell what your co-pay for medications will be in advance as this is different depending on your insurance coverage. However, we may be able to find a substitute medication at lower cost or fill out paperwork to get insurance to cover a needed medication.   If a prior authorization is required to get your medication covered by your insurance company, please allow us 1-2 business days to complete this process.  Drug prices often vary depending on where the prescription is filled and some pharmacies may offer cheaper prices.  The website www.goodrx.com contains coupons for medications through different pharmacies. The prices here do not account for what the cost may be with help from insurance (it may be cheaper with your insurance), but the website can give you the price if you did not use any insurance.  - You can print the associated coupon and take it with your prescription to the pharmacy.  - You may also stop by our office during regular business hours and pick up a GoodRx coupon card.  - If you need your prescription sent electronically to a different pharmacy, notify our office through [facility] MyChart or by phone at [phone] option 4.  

## 2022-09-08 NOTE — Progress Notes (Signed)
   Follow-Up Visit   Subjective  Megan Cummings is a 47 y.o. female who presents for the following: patient would like itchy spots at back checked. She is not able to check her back and has been recently diagnosed with breast cancer, also with hx of BCC.    The following portions of the chart were reviewed this encounter and updated as appropriate: medications, allergies, medical history  Review of Systems:  No other skin or systemic complaints except as noted in HPI or Assessment and Plan.  Objective  Well appearing patient in no apparent distress; mood and affect are within normal limits.    A focused examination was performed of the following areas: Back, shoulders, legs, arms  Relevant exam findings are noted in the Assessment and Plan.  L spinal upper back Erythematous stuck-on, waxy papule    Assessment & Plan   LENTIGINES Exam: scattered tan macules Due to sun exposure Treatment Plan: Benign-appearing, observe. Recommend daily broad spectrum sunscreen SPF 30+ to sun-exposed areas, reapply every 2 hours as needed.  Call for any changes  ACTINIC DAMAGE - chronic, secondary to cumulative UV radiation exposure/sun exposure over time - diffuse scaly erythematous macules with underlying dyspigmentation - Recommend daily broad spectrum sunscreen SPF 30+ to sun-exposed areas, reapply every 2 hours as needed.  - Recommend staying in the shade or wearing long sleeves, sun glasses (UVA+UVB protection) and wide brim hats (4-inch brim around the entire circumference of the hat). - Call for new or changing lesions.   Inflamed seborrheic keratosis L spinal upper back  Symptomatic, irritating, patient would like treated.   Destruction of lesion - L spinal upper back  Destruction method: cryotherapy   Informed consent: discussed and consent obtained   Lesion destroyed using liquid nitrogen: Yes   Region frozen until ice ball extended beyond lesion: Yes   Outcome:  patient tolerated procedure well with no complications   Post-procedure details: wound care instructions given   Additional details:  Prior to procedure, discussed risks of blister formation, small wound, skin dyspigmentation, or rare scar following cryotherapy. Recommend Vaseline ointment to treated areas while healing.   History of basal cell carcinoma (BCC) of skin  Nevus  Lentigo   MELANOCYTIC NEVI Exam: Tan-brown and/or pink-flesh-colored symmetric macules and papules  Treatment Plan: Benign appearing on exam today. Recommend observation. Call clinic for new or changing moles. Recommend daily use of broad spectrum spf 30+ sunscreen to sun-exposed areas.  NEVI Exam:  3 x 2 mm medium dark brown macule at left mid back 3.5 mm at right lower thigh x 2  Treatment Plan: Benign appearing on exam today. Recommend observation. Call clinic for new or changing moles. Recommend daily use of broad spectrum spf 30+ sunscreen to sun-exposed areas.   HISTORY OF BASAL CELL CARCINOMA OF THE SKIN - No evidence of recurrence today - Recommend regular full body skin exams - Recommend daily broad spectrum sunscreen SPF 30+ to sun-exposed areas, reapply every 2 hours as needed.  - Call if any new or changing lesions are noted between office visits   Return if symptoms worsen or fail to improve.  Anise Salvo, RMA, am acting as scribe for Willeen Niece, MD .   Documentation: I have reviewed the above documentation for accuracy and completeness, and I agree with the above.  Willeen Niece, MD

## 2022-10-02 ENCOUNTER — Other Ambulatory Visit: Payer: Self-pay | Admitting: Oncology

## 2022-10-18 ENCOUNTER — Inpatient Hospital Stay: Admission: RE | Admit: 2022-10-18 | Payer: BC Managed Care – PPO | Source: Ambulatory Visit

## 2022-10-18 ENCOUNTER — Other Ambulatory Visit: Payer: BC Managed Care – PPO

## 2022-11-29 ENCOUNTER — Other Ambulatory Visit: Payer: Self-pay | Admitting: Family Medicine

## 2022-11-30 ENCOUNTER — Inpatient Hospital Stay: Payer: BC Managed Care – PPO

## 2022-12-01 ENCOUNTER — Inpatient Hospital Stay: Payer: BC Managed Care – PPO | Attending: Oncology

## 2022-12-01 DIAGNOSIS — C50912 Malignant neoplasm of unspecified site of left female breast: Secondary | ICD-10-CM | POA: Insufficient documentation

## 2022-12-01 DIAGNOSIS — Z170 Estrogen receptor positive status [ER+]: Secondary | ICD-10-CM | POA: Insufficient documentation

## 2022-12-01 DIAGNOSIS — Z79811 Long term (current) use of aromatase inhibitors: Secondary | ICD-10-CM | POA: Insufficient documentation

## 2022-12-01 MED ORDER — LEUPROLIDE ACETATE 3.75 MG IM KIT
11.2500 mg | PACK | INTRAMUSCULAR | Status: DC
  Administered 2022-12-01: 11.25 mg via INTRAMUSCULAR
  Filled 2022-12-01: qty 11.25

## 2022-12-31 IMAGING — MG MM DIGITAL DIAGNOSTIC UNILAT*L* W/ TOMO W/ CAD
4 series · 4 of 12 positions shown · non-contrast
Comparison: Previous exam(s).

CLINICAL DATA: History of LEFT lumpectomy in January 2021. Patient
has had multiple aspirations of seromas since surgery. Today she
reports puffiness and pain in the OUTER portion of the LEFT breast.
She also has pain in the LATERAL portion of the breast when she has
physical therapy.

EXAM:
DIGITAL DIAGNOSTIC UNILATERAL LEFT MAMMOGRAM WITH TOMOSYNTHESIS AND
CAD; ULTRASOUND LEFT BREAST LIMITED
TECHNIQUE: Left digital diagnostic mammography and breast tomosynthesis was
performed. The images were evaluated with computer-aided detection.;
Targeted ultrasound examination of the left breast was performed.

[L MLO synth-2D]
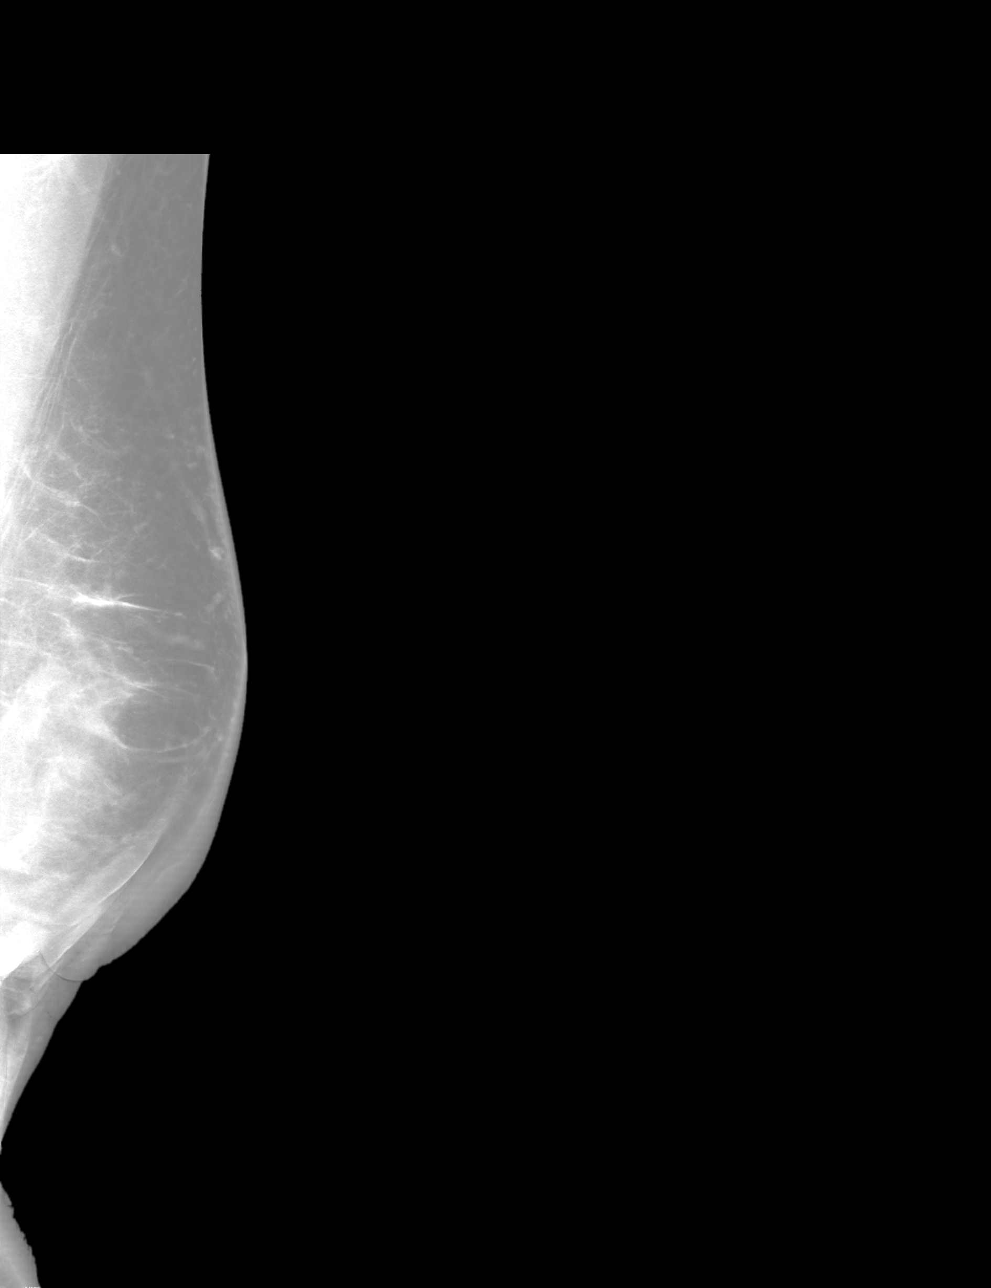

[L CC synth-2D]
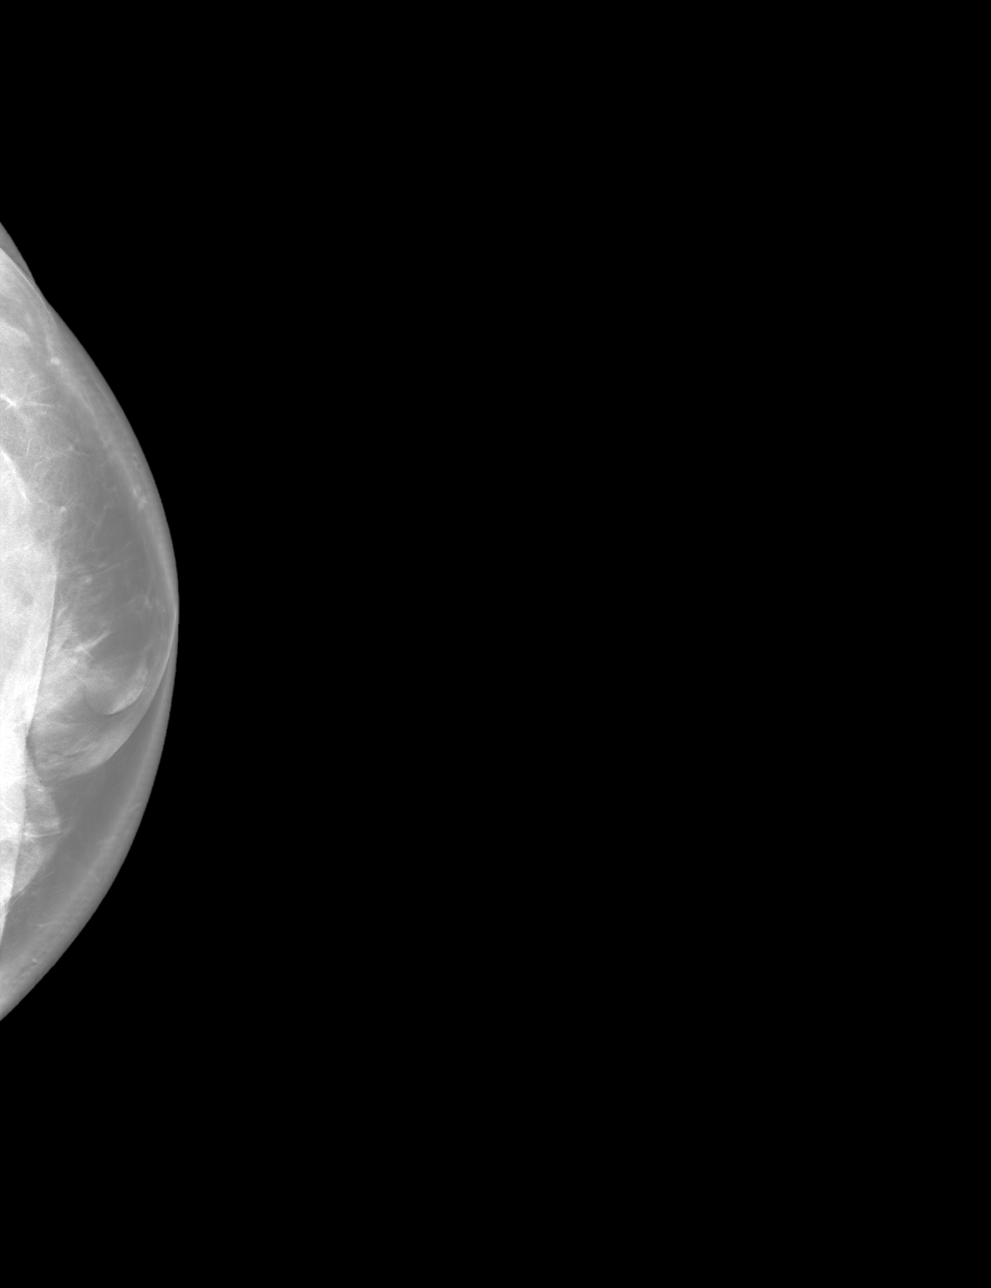

[L MLO tomo · tomo slice 48/95.0]
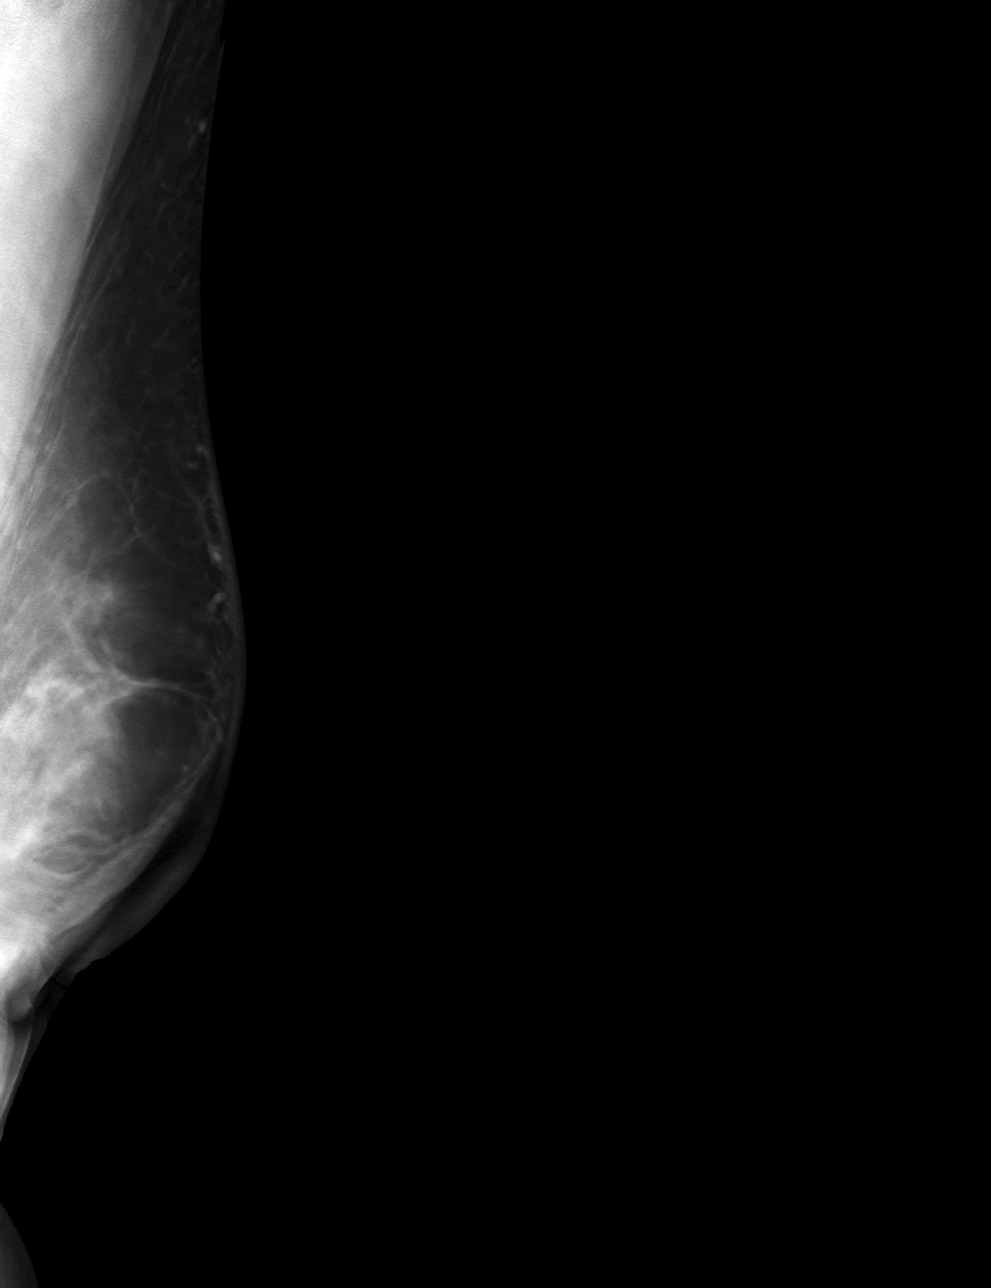

[L CC tomo · tomo slice 49/98.0]
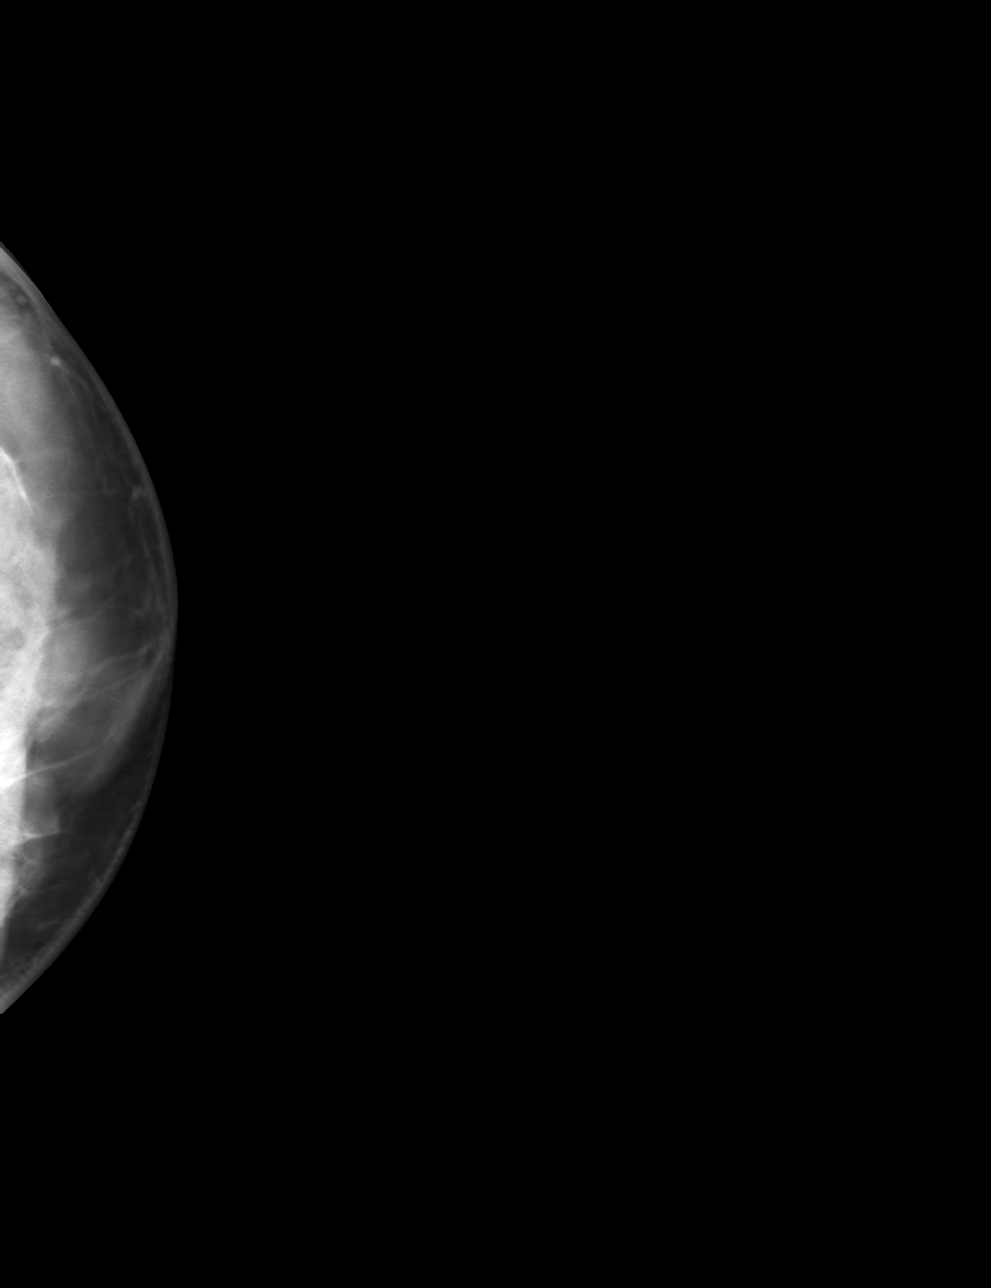

[4 of 12 positions shown; findings below may reference images not displayed]

ACR Breast Density Category c: The breast tissue is heterogeneously
dense, which may obscure small masses.
FINDINGS: Post operative changes are seen in the MEDIAL aspect of the
RIGHTbreast. There is skin and trabecular thickening consistent with
interval radiation treatment.

On physical exam, I palpate no mass in the LATERAL portion of the
LEFT breast. The patient is tender on physical exam of the LATERAL
portion of the breast. There is no erythema. There is a well-healed
excision scar in the LOWER INNER QUADRANT the RIGHT breast in the
LEFT axilla.

Targeted ultrasound is performed, showing normal appearing dense
fibroglandular tissue in the LATERAL aspects of the LEFT breast in
the areas of concern. There is no fluid collection in these regions.
IMPRESSION: No mammographic or ultrasound evidence for malignancy.

RECOMMENDATION:
Bilateral diagnostic mammogram is recommended in January 2022.

I have discussed the findings and recommendations with the patient.
If applicable, a reminder letter will be sent to the patient
regarding the next appointment.

BI-RADS CATEGORY  2: Benign.

## 2022-12-31 IMAGING — US US BREAST*L* LIMITED INC AXILLA
1 series · 5 of 5 positions shown · non-contrast
Comparison: Previous exam(s).

CLINICAL DATA: History of LEFT lumpectomy in January 2021. Patient
has had multiple aspirations of seromas since surgery. Today she
reports puffiness and pain in the OUTER portion of the LEFT breast.
She also has pain in the LATERAL portion of the breast when she has
physical therapy.

EXAM:
DIGITAL DIAGNOSTIC UNILATERAL LEFT MAMMOGRAM WITH TOMOSYNTHESIS AND
CAD; ULTRASOUND LEFT BREAST LIMITED
TECHNIQUE: Left digital diagnostic mammography and breast tomosynthesis was
performed. The images were evaluated with computer-aided detection.;
Targeted ultrasound examination of the left breast was performed.

[Series 1: us breast*left* limited inc axilla · 0.07mm/px · 5 of 5 slices shown]
[im 1/5]
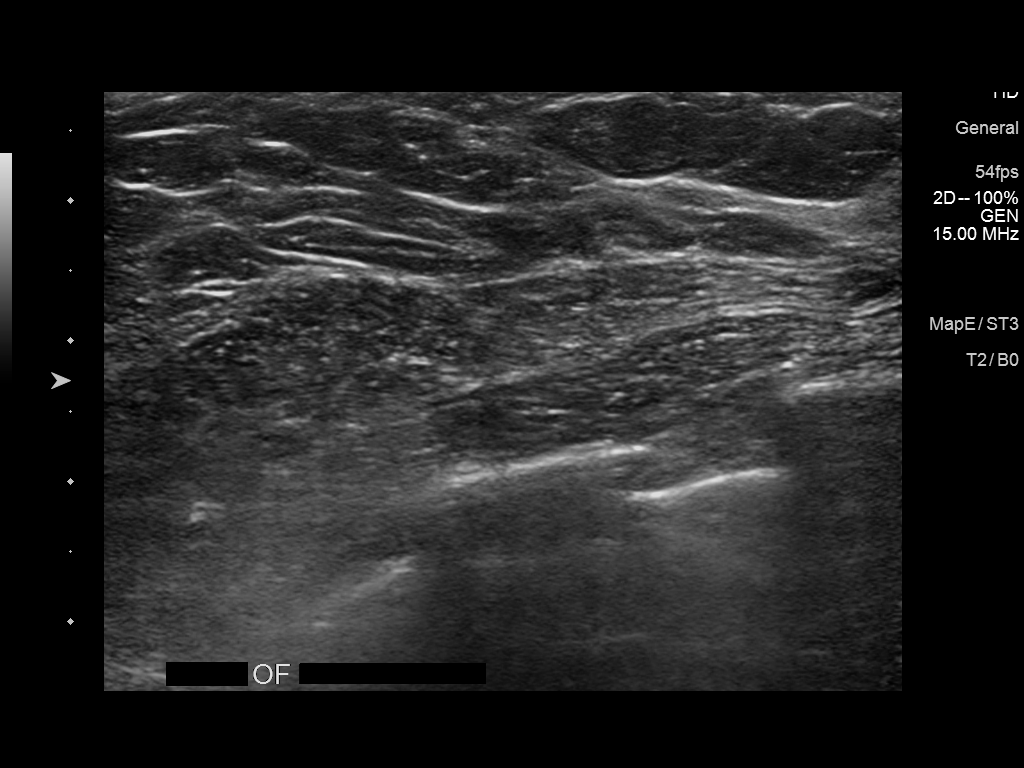
[im 2/5]
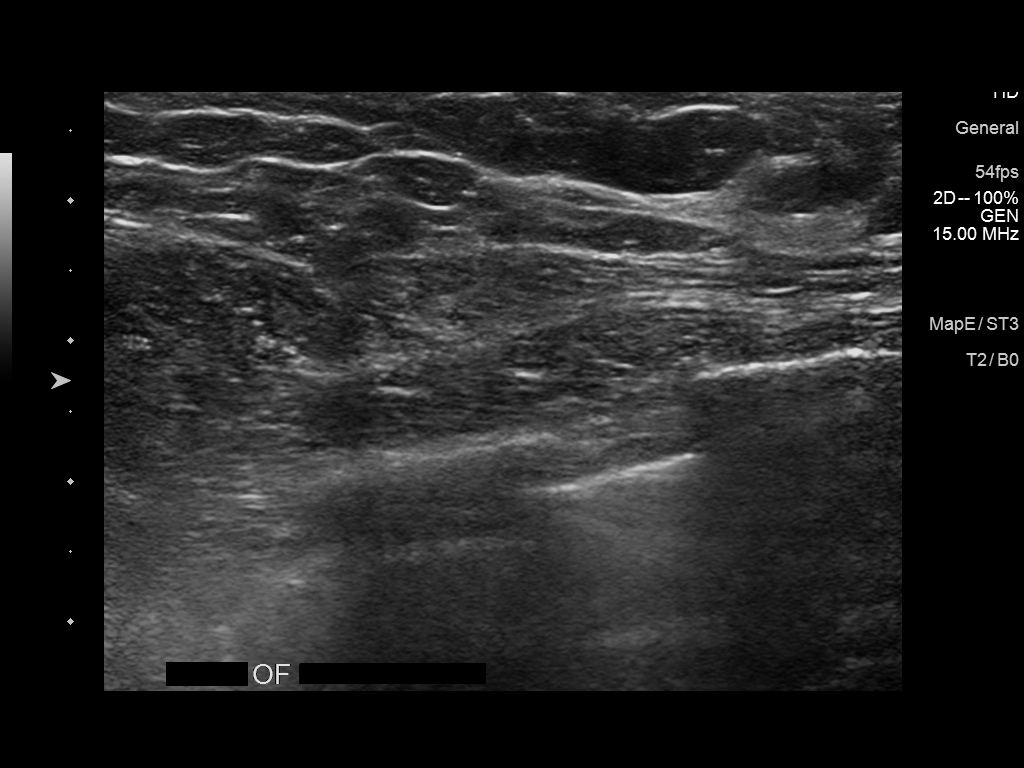
[im 3/5]
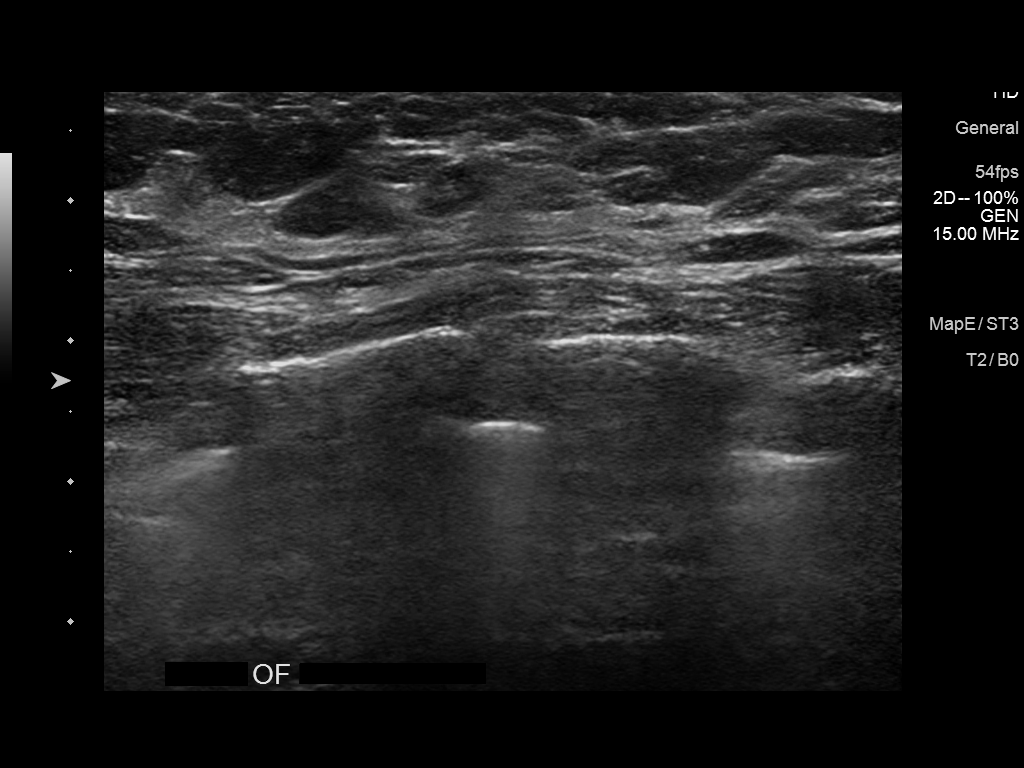
[im 4/5]
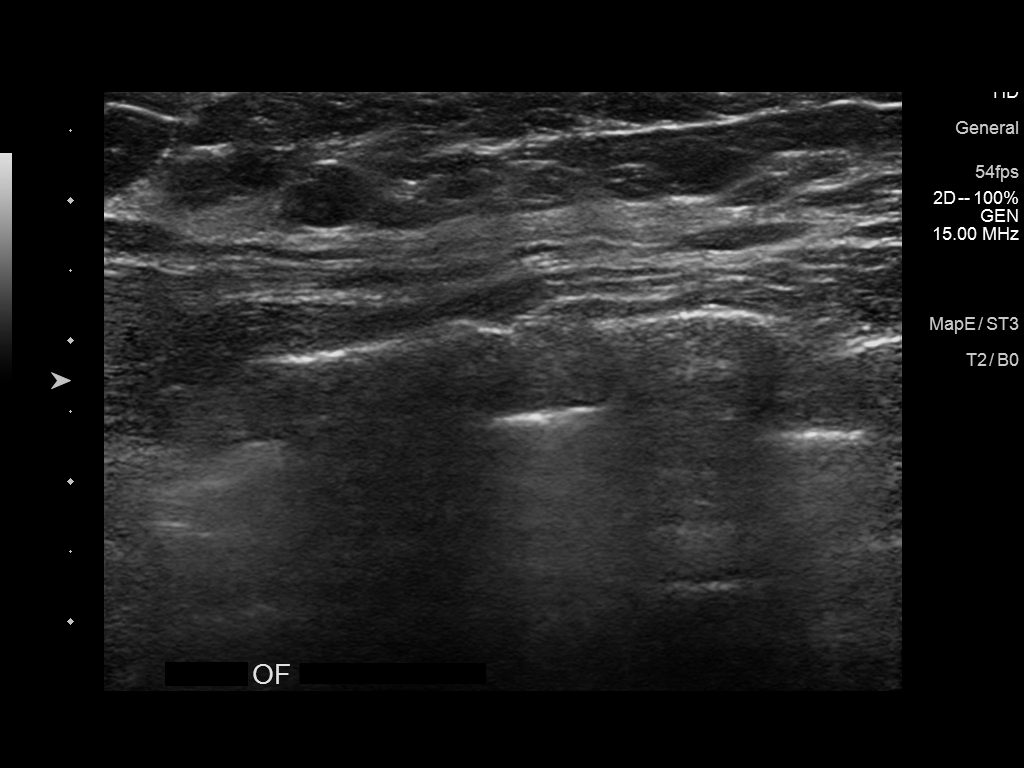
[im 5/5]
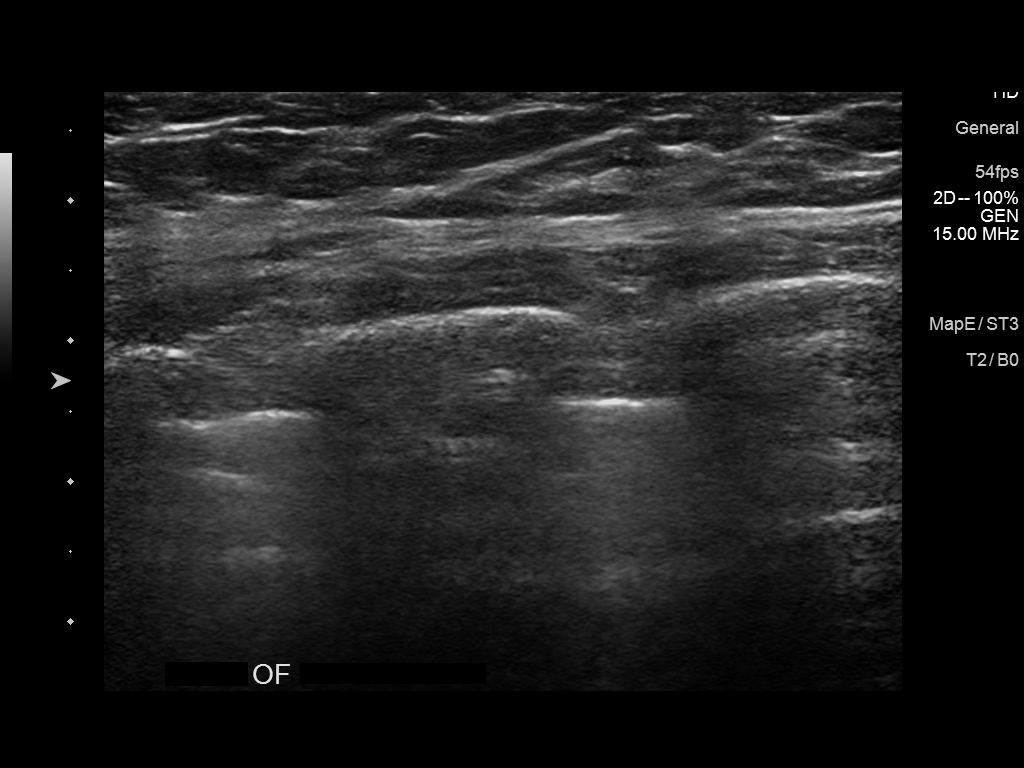

[5 of 5 positions shown; findings below may reference images not displayed]

ACR Breast Density Category c: The breast tissue is heterogeneously
dense, which may obscure small masses.
FINDINGS: Post operative changes are seen in the MEDIAL aspect of the
RIGHTbreast. There is skin and trabecular thickening consistent with
interval radiation treatment.

On physical exam, I palpate no mass in the LATERAL portion of the
LEFT breast. The patient is tender on physical exam of the LATERAL
portion of the breast. There is no erythema. There is a well-healed
excision scar in the LOWER INNER QUADRANT the RIGHT breast in the
LEFT axilla.

Targeted ultrasound is performed, showing normal appearing dense
fibroglandular tissue in the LATERAL aspects of the LEFT breast in
the areas of concern. There is no fluid collection in these regions.
IMPRESSION: No mammographic or ultrasound evidence for malignancy.

RECOMMENDATION:
Bilateral diagnostic mammogram is recommended in January 2022.

I have discussed the findings and recommendations with the patient.
If applicable, a reminder letter will be sent to the patient
regarding the next appointment.

BI-RADS CATEGORY  2: Benign.

## 2023-01-07 ENCOUNTER — Other Ambulatory Visit: Payer: Self-pay | Admitting: Oncology

## 2023-01-09 ENCOUNTER — Encounter: Payer: Self-pay | Admitting: Oncology

## 2023-01-12 ENCOUNTER — Other Ambulatory Visit: Payer: BC Managed Care – PPO

## 2023-01-12 ENCOUNTER — Ambulatory Visit
Admission: RE | Admit: 2023-01-12 | Discharge: 2023-01-12 | Disposition: A | Discharge status: Home / Self Care | Payer: BC Managed Care – PPO | Source: Ambulatory Visit | Attending: Oncology | Admitting: Oncology

## 2023-01-12 DIAGNOSIS — Z79811 Long term (current) use of aromatase inhibitors: Secondary | ICD-10-CM | POA: Diagnosis not present

## 2023-01-12 DIAGNOSIS — E2839 Other primary ovarian failure: Secondary | ICD-10-CM | POA: Insufficient documentation

## 2023-01-12 DIAGNOSIS — C50912 Malignant neoplasm of unspecified site of left female breast: Secondary | ICD-10-CM | POA: Diagnosis not present

## 2023-01-12 DIAGNOSIS — Z170 Estrogen receptor positive status [ER+]: Secondary | ICD-10-CM | POA: Diagnosis not present

## 2023-01-12 DIAGNOSIS — R92333 Mammographic heterogeneous density, bilateral breasts: Secondary | ICD-10-CM | POA: Diagnosis not present

## 2023-01-12 DIAGNOSIS — Z5181 Encounter for therapeutic drug level monitoring: Secondary | ICD-10-CM | POA: Insufficient documentation

## 2023-01-12 DIAGNOSIS — Z853 Personal history of malignant neoplasm of breast: Secondary | ICD-10-CM | POA: Diagnosis not present

## 2023-01-12 DIAGNOSIS — N6310 Unspecified lump in the right breast, unspecified quadrant: Secondary | ICD-10-CM | POA: Diagnosis not present

## 2023-02-28 ENCOUNTER — Ambulatory Visit: Payer: BC Managed Care – PPO | Admitting: Oncology

## 2023-02-28 ENCOUNTER — Ambulatory Visit: Payer: BC Managed Care – PPO

## 2023-03-06 ENCOUNTER — Ambulatory Visit
Admission: RE | Admit: 2023-03-06 | Discharge: 2023-03-06 | Disposition: A | Discharge status: Home / Self Care | Payer: BC Managed Care – PPO | Source: Ambulatory Visit | Attending: Oncology | Admitting: Oncology

## 2023-03-06 DIAGNOSIS — Z79811 Long term (current) use of aromatase inhibitors: Secondary | ICD-10-CM | POA: Insufficient documentation

## 2023-03-06 DIAGNOSIS — Z170 Estrogen receptor positive status [ER+]: Secondary | ICD-10-CM | POA: Insufficient documentation

## 2023-03-06 DIAGNOSIS — C50912 Malignant neoplasm of unspecified site of left female breast: Secondary | ICD-10-CM | POA: Insufficient documentation

## 2023-03-06 DIAGNOSIS — Z5181 Encounter for therapeutic drug level monitoring: Secondary | ICD-10-CM | POA: Insufficient documentation

## 2023-03-06 DIAGNOSIS — Z780 Asymptomatic menopausal state: Secondary | ICD-10-CM | POA: Diagnosis not present

## 2023-03-06 DIAGNOSIS — E2839 Other primary ovarian failure: Secondary | ICD-10-CM | POA: Insufficient documentation

## 2023-03-09 ENCOUNTER — Other Ambulatory Visit: Payer: Self-pay | Admitting: Family Medicine

## 2023-03-13 ENCOUNTER — Inpatient Hospital Stay: Payer: BC Managed Care – PPO | Attending: Oncology | Admitting: Oncology

## 2023-03-13 ENCOUNTER — Other Ambulatory Visit: Payer: Self-pay | Admitting: Oncology

## 2023-03-13 ENCOUNTER — Encounter: Payer: Self-pay | Admitting: Oncology

## 2023-03-13 ENCOUNTER — Inpatient Hospital Stay: Payer: BC Managed Care – PPO

## 2023-03-13 VITALS — BP 167/84 | HR 64 | Temp 97.6°F | Resp 20 | Wt 166.6 lb

## 2023-03-13 DIAGNOSIS — Z87891 Personal history of nicotine dependence: Secondary | ICD-10-CM | POA: Diagnosis not present

## 2023-03-13 DIAGNOSIS — Z170 Estrogen receptor positive status [ER+]: Secondary | ICD-10-CM | POA: Diagnosis not present

## 2023-03-13 DIAGNOSIS — Z1721 Progesterone receptor positive status: Secondary | ICD-10-CM | POA: Insufficient documentation

## 2023-03-13 DIAGNOSIS — Z801 Family history of malignant neoplasm of trachea, bronchus and lung: Secondary | ICD-10-CM | POA: Insufficient documentation

## 2023-03-13 DIAGNOSIS — Z5181 Encounter for therapeutic drug level monitoring: Secondary | ICD-10-CM

## 2023-03-13 DIAGNOSIS — Z08 Encounter for follow-up examination after completed treatment for malignant neoplasm: Secondary | ICD-10-CM

## 2023-03-13 DIAGNOSIS — I10 Essential (primary) hypertension: Secondary | ICD-10-CM | POA: Insufficient documentation

## 2023-03-13 DIAGNOSIS — Z807 Family history of other malignant neoplasms of lymphoid, hematopoietic and related tissues: Secondary | ICD-10-CM | POA: Insufficient documentation

## 2023-03-13 DIAGNOSIS — C50912 Malignant neoplasm of unspecified site of left female breast: Secondary | ICD-10-CM

## 2023-03-13 DIAGNOSIS — Z1732 Human epidermal growth factor receptor 2 negative status: Secondary | ICD-10-CM | POA: Diagnosis not present

## 2023-03-13 DIAGNOSIS — Z79811 Long term (current) use of aromatase inhibitors: Secondary | ICD-10-CM | POA: Insufficient documentation

## 2023-03-13 DIAGNOSIS — Z800 Family history of malignant neoplasm of digestive organs: Secondary | ICD-10-CM | POA: Diagnosis not present

## 2023-03-13 MED ORDER — LEUPROLIDE ACETATE 3.75 MG IM KIT
11.2500 mg | PACK | INTRAMUSCULAR | Status: DC
  Administered 2023-03-13: 11.25 mg via INTRAMUSCULAR
  Filled 2023-03-13: qty 11.25

## 2023-03-13 NOTE — Progress Notes (Signed)
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  Mammogram showed innumerable cysts in the left breast the largest of which was 1.2 x 1.1 x 1.4 cm.  Targeted ultrasound of the inner left breast showed a hypoechoic mass measuring 1 x 0.8 x 1.9 cm.  No definite lymphadenopathy noted in the left axilla.  No suspicious findings in the right breast.  She has had her last mammogram in 2019 when was again noted to have bilateral breast cysts and subsequent ultrasounds showed benign cysts with no suspicious lesions.Ultrasound-guided biopsy of the left breast mass showed invasive mammary carcinoma 6 mm grade 1 ER 81 to 90% positive PR 91 200% positive and HER2 equivocal by IHC and FISH testing negative   Menarche at the age of 1.  She is G1, P1 L1.  No use of birth control.  No prior hysterectomy.  She is still premenopausal and gets her menstrual cycles regularly.  Family history significant for colon cancer in paternal grandmother and lung cancer or lymphoma in maternal grandfather.   Final lumpectomy pathology showed 13 mm grade 1 invasive mammary carcinoma with negative margins.  3 sentinel lymph nodes negative for malignancy.  No  lymphovascular invasion.  Oncotype score came back at 16And chemotherapy benefit for this score in patients less than 61 years of age would be ~1.6%.  Adjuvant chemotherapy was not recommended.  Patient completed adjuvant radiation treatment.  Patient is on ovarian suppression with Zoladex plus Arimidex started in April 2023.    Interval history-patient had spotting for about 3 to 4 days after her last dose of Lupron 3 months ago.  No bleeding or spotting since then.  Tolerating ArimidexWell without any significant side effects.  Blood pressure has been consistently between 140s to 160s after restarting Lupron.  ECOG PS- 0 Pain scale- 0  Review of systems- Review of Systems  Constitutional:  Negative for chills, fever, malaise/fatigue and weight loss.  HENT:  Negative for congestion, ear discharge and nosebleeds.   Eyes:  Negative for blurred vision.  Respiratory:  Negative for cough, hemoptysis, sputum production, shortness of breath and wheezing.   Cardiovascular:  Negative for chest pain, palpitations, orthopnea and claudication.  Gastrointestinal:  Negative for abdominal pain, blood in stool, constipation, diarrhea, heartburn, melena, nausea and vomiting.  Genitourinary:  Negative for dysuria, flank pain, frequency, hematuria and urgency.  Musculoskeletal:  Negative for back pain, joint pain and myalgias.  Skin:  Negative for rash.  Neurological:  Negative for dizziness, tingling, focal weakness, seizures, weakness and headaches.  Endo/Heme/Allergies:  Does not bruise/bleed easily.  Psychiatric/Behavioral:  Negative for depression and suicidal ideas. The patient does not have insomnia.       Allergies  Allergen Reactions   Sulfa Antibiotics Rash     Past Medical History:  Diagnosis Date   Family history of  colon cancer    Family history of lung cancer    Fibrocystic breast    History of basal cell carcinoma (BCC) excision 12/06/2016   09/ 2018 Right forearm. Superficial. ;   06/ 2017 left anterior shoulder and left upper back   History of kidney stones    Hypertension    Malignant neoplasm of breast, stage 1, estrogen receptor positive, left (HCC) 09/2020   oncologist--- dr a. Suhailah Kwan/  radiation oncologist-- dr g. Rushie Chestnut;  dx 06/ 2022  invasive mammary carcinoma, ER/PR +;    09-25-2020  s/p left partial mastectomy w/ node dissection;  completed radiation 04-06-2021   Personal history of radiation therapy    left breast  03-01-2021  to 04-06-2021   Suburethral cyst    SUI (stress urinary incontinence, female)    Umbilical hernia 10/2015     Past Surgical History:  Procedure Laterality Date   COLONOSCOPY N/A 11/18/2020   Procedure: COLONOSCOPY;  Surgeon: Pasty Spillers, MD;  Location: [facility];  Service: Endoscopy;  Laterality: N/A;   CYSTOSCOPY N/A 07/18/2022   Procedure: CYSTOSCOPY;  Surgeon: Marguerita Beards, MD;  Location: [facility];  Service: Gynecology;  Laterality: N/A;   FOOT SURGERY Right 07/04/2010   great toe   PART MASTECTOMY,RADIO FREQUENCY LOCALIZER,AXILLARY SENTINEL NODE BIOPSY Left 01/25/2021   Procedure: PART MASTECTOMY,RADIO FREQUENCY LOCALIZER,AXILLARY SENTINEL NODE BIOPSY;  Surgeon: Carolan Shiver, MD;  Location: [facility];  Service: General;  Laterality: Left;   URETHRAL CYST REMOVAL N/A 07/18/2022   Procedure: EXCISION SUBURETHRAL CYST;  Surgeon: Marguerita Beards, MD;  Location: [facility];  Service: Gynecology;  Laterality: N/A;    Social History   Socioeconomic History   Marital status: Married    Spouse name: Not on file   Number of children: Not on file   Years of education: Not on file   Highest education level: Not on file  Occupational History   Not on file  Tobacco Use   Smoking status: Former    Current packs/day: 0.00    Average packs/day: 0.5 packs/day for 10.0 years (5.0 ttl pk-yrs)    Types: Cigarettes    Start date: 20    Quit date: 2005    Years  since quitting: 19.9   Smokeless tobacco: Never  Vaping Use   Vaping status: Never Used  Substance and Sexual Activity   Alcohol use: Yes    Comment: occasional   Drug use: Not Currently    Comment: 07-12-2022  per pt last used approx 20003   Sexual activity: Yes    Birth control/protection: None  Other Topics Concern   Not on file  Social History Narrative   Not on file   Social Determinants of Health   Financial Resource Strain: Not on file  Food Insecurity: Not on file  Transportation Needs: Not on file  Physical Activity: Not on file  Stress: Not on file  Social Connections: Not on file  Intimate Partner Violence: Not on file    Family History  Problem Relation Age of Onset   Healthy Mother    Hyperlipidemia Father    Healthy Brother    Lung cancer Maternal Uncle    Healthy Maternal Grandmother    Emphysema Maternal Grandfather    Colon cancer Paternal Grandmother    Lung cancer Paternal Grandfather    Healthy Son    Breast cancer Neg Hx      Current Outpatient Medications:    acetaminophen (TYLENOL) 500 MG  tablet, Take 1 tablet (500 mg total) by mouth every 6 (six) hours as needed (pain)., Disp: 30 tablet, Rfl: 0   anastrozole (ARIMIDEX) 1 MG tablet, Take 1 tablet (1 mg total) by mouth daily., Disp: 90 tablet, Rfl: 2   Calcium Carb-Cholecalciferol (CALCIUM 600 + D PO), Take 1 tablet by mouth daily., Disp: , Rfl:    HYDROcodone-acetaminophen (NORCO/VICODIN) 5-325 MG tablet, Take by mouth., Disp: , Rfl:    ibuprofen (ADVIL) 600 MG tablet, Take 1 tablet (600 mg total) by mouth every 6 (six) hours as needed., Disp: 30 tablet, Rfl: 0   KRILL OIL PO, Take 1,000 mg by mouth daily., Disp: , Rfl:    LORazepam (ATIVAN) 0.5 MG tablet, TAKE 1 TABLET(0.5 MG) BY MOUTH TWICE DAILY AS NEEDED FOR ANXIETY (Patient taking differently: Take 0.5 mg by mouth 2 (two) times daily as needed for anxiety.), Disp: 30 tablet, Rfl: 0   magnesium 30 MG tablet, Take 30 mg by mouth 2 (two)  times daily., Disp: , Rfl:    meloxicam (MOBIC) 15 MG tablet, Take 15 mg by mouth daily as needed for pain., Disp: , Rfl:    oxyCODONE (OXY IR/ROXICODONE) 5 MG immediate release tablet, Take 1 tablet (5 mg total) by mouth every 4 (four) hours as needed for severe pain., Disp: 5 tablet, Rfl: 0   valsartan (DIOVAN) 80 MG tablet, TAKE 1 TABLET(80 MG) BY MOUTH DAILY, Disp: 90 tablet, Rfl: 0 No current facility-administered medications for this visit.  Facility-Administered Medications Ordered in Other Visits:    goserelin (ZOLADEX) injection 10.8 mg, 10.8 mg, Subcutaneous, Q90 days, Creig Hines, MD, 10.8 mg at 03/03/22 1146   leuprolide (LUPRON) injection 11.25 mg, 11.25 mg, Intramuscular, Q90 days, Creig Hines, MD, 11.25 mg at 08/30/22 1456   leuprolide (LUPRON) injection 11.25 mg, 11.25 mg, Intramuscular, Q90 days, Creig Hines, MD, 11.25 mg at 03/13/23 1341  Physical exam:  Vitals:   03/13/23 1309  BP: (!) 167/84  Pulse: 64  Resp: 20  Temp: 97.6 F (36.4 C)  SpO2: 100%  Weight: 166 lb 9.6 oz (75.6 kg)   Physical Exam Cardiovascular:     Rate and Rhythm: Normal rate and regular rhythm.     Heart sounds: Normal heart sounds.  Pulmonary:     Effort: Pulmonary effort is normal.     Breath sounds: Normal breath sounds.  Skin:    General: Skin is warm and dry.  Neurological:     Mental Status: She is alert and oriented to person, place, and time.    Breast exam was performed in seated and lying down position. Patient is status post left lumpectomy with a well-healed surgical scar. No evidence of any palpable masses. No evidence of axillary adenopathy. No evidence of any palpable masses or lumps in the right breast. No evidence of right axillary adenopathy      Latest Ref Rng & Units 07/18/2022    9:38 AM  CMP  Glucose 70 - 99 mg/dL 433   BUN 6 - 20 mg/dL 24   Creatinine 2.95 - 1.00 mg/dL 1.88   Sodium 416 - 606 mmol/L 141   Potassium 3.5 - 5.1 mmol/L 4.6   Chloride 98 -  111 mmol/L 103       Latest Ref Rng & Units 07/18/2022    9:38 AM  CBC  Hemoglobin 12.0 - 15.0 g/dL 30.1   Hematocrit 60.1 - 46.0 % 42.0     No images are attached to the  encounter.  DG Bone Density  Result Date: 03/06/2023 EXAM: DUAL X-RAY ABSORPTIOMETRY (DXA) FOR BONE MINERAL DENSITY IMPRESSION: Your patient Aleiah Belfield completed a BMD test on 03/06/2023 using the [employer] DXA System (software version: 14.10) manufactured by [employer]. The following summarizes the results of our evaluation. Technologist: SCE PATIENT BIOGRAPHICAL: Name: Tamer, Steinle Patient ID: [id number] Birth Date: Aug 08, 1975 Height: 68.0 in. Gender: Female Exam Date: 03/06/2023 Weight: 166.2 lbs. Indications: Breast CA, Caucasian, Height Loss, High Risk Meds, History of Breast Cancer, History of Radiation, Postmenopausal Fractures: Treatments: Arimidex, Calcium, meloxicam, Vitamin D DENSITOMETRY RESULTS: Site      Region     Measured Date Measured Age WHO Classification Young Adult T-score BMD         %Change vs. Previous Significant Change (*) AP Spine L1-L4 1/06/2023 49.0 Normal 2.8 1.535 g/cm2 -4.4% Yes AP Spine L1-L4 03/02/2021 47.0 Normal 3.3 1.605 g/cm2 - - DualFemur Neck Right 03/06/2023 49.0 Normal -0.5 0.963 g/cm2 -3.8% - DualFemur Neck Right 03/02/2021 47.0 Normal -0.3 1.001 g/cm2 - - DualFemur Total Mean 03/06/2023 49.0 Normal 0.5 1.068 g/cm2 0.6% - DualFemur Total Mean 03/02/2021 47.0 Normal 0.4 1.062 g/cm2 - - ASSESSMENT: The BMD measured at Femur Neck Right is 0.963 g/cm2 with a T-score of -0.5. This patient is considered normal according to World Health Organization [facility]) criteria. The scan quality is good. Compared with prior study, there has been a significant decrease in the spine. Compared with prior study, there has been no significant change in the total hip. World Science writer [facility]) criteria for post-menopausal, Caucasian Women: Normal:                   T-score at or above -1  SD Osteopenia/low bone mass: T-score between -1 and -2.5 SD Osteoporosis:             T-score at or below -2.5 SD RECOMMENDATIONS: 1. All patients should optimize calcium and vitamin D intake. 2. Consider FDA-approved medical therapies in postmenopausal women and men aged 30 years and older, based on the following: a. A hip or vertebral(clinical or morphometric) fracture b. T-score < -2.5 at the femoral neck or spine after appropriate evaluation to exclude secondary causes c. Low bone mass (T-score between -1.0 and -2.5 at the femoral neck or spine) and a 10-year probability of a hip fracture > 3% or a 10-year probability of a major osteoporosis-related fracture > 20% based on the [place]-adapted WHO algorithm 3. Clinician judgment and/or patient preferences may indicate treatment for people with 10-year fracture probabilities above or below these levels FOLLOW-UP: People with diagnosed cases of osteoporosis or at high risk for fracture should have regular bone mineral density tests. For patients eligible for Medicare, routine testing is allowed once every 2 years. The testing frequency can be increased to one year for patients who have rapidly progressing disease, those who are receiving or discontinuing medical therapy to restore bone mass, or have additional risk factors. I have reviewed this report, and agree with the above findings. [facility] Electronically Signed   By: Romona Curls M.D.   On: 03/06/2023 09:16     Assessment and plan- Patient is a 47 y.o. female with 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  She is here for routine follow-up of her breast cancer  Clinically patient is doing well with no concerning signs and Symptoms of recurrence based on today's exam.  She had a  bone density in December 2024 which was unremarkable.  October mammogram was also unremarkable.  She will receive Zoladex today and Zoladex every 3  months and I will see her back in 6 months.  She will continue Arimidex at this time as well along with calcium and vitamin D  Hypertension: She will get in touch with Her primary care doctor to adjust her blood pressure medications   Visit Diagnosis 1. Encounter for follow-up surveillance of breast cancer   2. Visit for monitoring Arimidex therapy   3. Encounter for monitoring Lupron therapy      Dr. Owens Shark, MD, MPH [facility] [phone] 03/13/2023 2:44 PM

## 2023-05-04 ENCOUNTER — Ambulatory Visit: Payer: BC Managed Care – PPO | Attending: Radiation Oncology | Admitting: Radiation Oncology

## 2023-06-06 ENCOUNTER — Encounter: Payer: Self-pay | Admitting: Family Medicine

## 2023-06-06 ENCOUNTER — Ambulatory Visit: Payer: BC Managed Care – PPO | Admitting: Family Medicine

## 2023-06-06 VITALS — BP 149/85 | HR 70 | Temp 99.1°F | Ht 69.0 in | Wt 169.0 lb

## 2023-06-06 DIAGNOSIS — Z170 Estrogen receptor positive status [ER+]: Secondary | ICD-10-CM

## 2023-06-06 DIAGNOSIS — Z0000 Encounter for general adult medical examination without abnormal findings: Secondary | ICD-10-CM

## 2023-06-06 DIAGNOSIS — C50912 Malignant neoplasm of unspecified site of left female breast: Secondary | ICD-10-CM | POA: Diagnosis not present

## 2023-06-06 DIAGNOSIS — I159 Secondary hypertension, unspecified: Secondary | ICD-10-CM | POA: Diagnosis not present

## 2023-06-06 DIAGNOSIS — N182 Chronic kidney disease, stage 2 (mild): Secondary | ICD-10-CM | POA: Diagnosis not present

## 2023-06-06 DIAGNOSIS — Z0001 Encounter for general adult medical examination with abnormal findings: Secondary | ICD-10-CM

## 2023-06-06 MED ORDER — VALSARTAN 160 MG PO TABS
160.0000 mg | ORAL_TABLET | Freq: Every day | ORAL | 3 refills | Status: AC
Start: 2023-06-06 — End: ?

## 2023-06-06 NOTE — Patient Instructions (Signed)

## 2023-06-06 NOTE — Assessment & Plan Note (Addendum)
 Diagnosed in 2022. Currently on anastrozole and leuprolide injections. Previous treatments included gosarelin (Zoladex). Reports muscle spasms in the left breast area, likely related to surgery and/or medication side effects. Musculoskeletal pain is a known side effect of anastrozole. - Continue anastrozole and leuprolide - Continue magnesium supplementation for musculoskeletal pain - Continue to follow with oncology, Dr. Smith Robert; defer to specialist management

## 2023-06-06 NOTE — Progress Notes (Signed)
 Complete physical exam   Patient: Megan Cummings   DOB: 09-09-1975   48 y.o. Female  MRN: [id number] Visit Date: 06/06/2023  Today's healthcare provider: Sherlyn Hay, DO   Chief Complaint  Patient presents with   Annual Exam    Patient has concerns that her blood pressure may be more elevated than she would like despite the increase in her medication.   She reports most reading being 140's-160's over 70's-90's  Patient see gyn for women's health   Subjective    Megan Cummings is a 48 y.o. female who presents today for a complete physical exam.  She reports consuming a general diet.  She exercises regularly at the gym, including moderate weight-lifting  She generally feels well. She reports sleeping well. She does have additional problems to discuss today.  HPI HPI     Annual Exam    Additional comments: Patient has concerns that her blood pressure may be more elevated than she would like despite the increase in her medication.   She reports most reading being 140's-160's over 70's-90's  Patient see gyn for women's health      Last edited by Adline Peals, CMA on 06/06/2023  3:19 PM.       Last annual: 04/06/2022  Megan Cummings is a 48 year old female with hypertension who presents for blood pressure management.  Blood pressure readings at home range from the high 140s to 159/90 mmHg. She is currently on Diovan (valsartan) 80 mg, which was increased from 40 mg in December 2024. She has been monitoring her blood pressure regularly at home. Her blood pressure issues began in 2022, coinciding with her breast cancer treatment, and she does not recall having hypertension prior to that year. Her oncologist indicated she has always had high blood pressure, but she does not remember this being the case.  She is undergoing treatment for breast cancer and receives leuprolide injections every three months and is also on anastrozole. She previously tried gosarelin  (Zoladex) but switched to leuprolide and anastrozole due to side effects, including hypertension. Her blood pressure did not improve with the change in medication.  She experiences muscle spasms in the left breast area, which she associates with her workouts and previous surgery in that area. These spasms occur above and below the left breast and are described as painful and uncomfortable, similar to a 'calf cramp'. She takes magnesium supplements to help with joint pain in her knees and hips.  In terms of her social history, she mentions avoiding certain wines that elevate her blood pressure and uses hydration packs called 'drip drop' to maintain hydration.  She confirms she has eaten today and finds it easy to come in for fasting blood work.   Past Medical History:  Diagnosis Date   Family history of colon cancer    Family history of lung cancer    Fibrocystic breast    History of basal cell carcinoma (BCC) excision 12/06/2016   09/ 2018 Right forearm. Superficial. ;  06/ 2017 left anterior shoulder and left upper back   History of kidney stones    Hypertension    Malignant neoplasm of breast, stage 1, estrogen receptor positive, left (HCC) 09/2020   oncologist--- dr a. rao/  radiation oncologist-- dr g. Rushie Chestnut;  dx 06/ 2022  invasive mammary carcinoma, ER/PR +;    09-25-2020  s/p left partial mastectomy w/ node dissection;  completed radiation 04-06-2021   Personal history of  radiation therapy    left breast  03-01-2021  to 04-06-2021   Suburethral cyst    SUI (stress urinary incontinence, female)    Umbilical hernia 10/2015   Past Surgical History:  Procedure Laterality Date   COLONOSCOPY N/A 11/18/2020   Procedure: COLONOSCOPY;  Surgeon: Pasty Spillers, MD;  Location: [facility];  Service: Endoscopy;  Laterality: N/A;   CYSTOSCOPY N/A 07/18/2022   Procedure: CYSTOSCOPY;  Surgeon: Marguerita Beards, MD;  Location: [facility];  Service: Gynecology;   Laterality: N/A;   FOOT SURGERY Right 07/04/2010   great toe   PART MASTECTOMY,RADIO FREQUENCY LOCALIZER,AXILLARY SENTINEL NODE BIOPSY Left 01/25/2021   Procedure: PART MASTECTOMY,RADIO FREQUENCY LOCALIZER,AXILLARY SENTINEL NODE BIOPSY;  Surgeon: Carolan Shiver, MD;  Location: [facility];  Service: General;  Laterality: Left;   URETHRAL CYST REMOVAL N/A 07/18/2022   Procedure: EXCISION SUBURETHRAL CYST;  Surgeon: Marguerita Beards, MD;  Location: [facility];  Service: Gynecology;  Laterality: N/A;   Social History   Socioeconomic History   Marital status: Married    Spouse name: Not on file   Number of children: Not on file   Years of education: Not on file   Highest education level: Not on file  Occupational History   Not on file  Tobacco Use   Smoking status: Former    Current packs/day: 0.00    Average packs/day: 0.5 packs/day for 10.0 years (5.0 ttl pk-yrs)    Types: Cigarettes    Start date: 22    Quit date: 2005    Years since quitting: 20.2   Smokeless tobacco: Never  Vaping Use   Vaping status: Never Used  Substance and Sexual Activity   Alcohol use: Yes    Comment: occasional   Drug use: Not Currently    Comment: 07-12-2022  per pt last used approx 20003   Sexual activity: Yes    Birth control/protection: None  Other Topics Concern   Not on file  Social History Narrative   Not on file   Social Drivers of Health   Financial Resource Strain: Not on file  Food Insecurity: Unknown (06/06/2023)   Hunger Vital Sign    Worried About Running Out of Food in the Last Year: Never true    Ran Out of Food in the Last Year: Not on file  Transportation Needs: No Transportation Needs (06/06/2023)   PRAPARE - Administrator, Civil Service (Medical): No    Lack of Transportation (Non-Medical): No  Physical Activity: Sufficiently Active (06/06/2023)   Exercise Vital Sign    Days of Exercise per Week: 6 days    Minutes of Exercise per  Session: 70 min  Stress: No Stress Concern Present (06/06/2023)   [employer] of Occupational Health - Occupational Stress Questionnaire    Feeling of Stress : Not at all  Social Connections: Socially Integrated (06/06/2023)   Social Connection and Isolation Panel [NHANES]    Frequency of Communication with Friends and Family: More than three times a week    Frequency of Social Gatherings with Friends and Family: More than three times a week    Attends Religious Services: More than 4 times per year    Active Member of [employer] or Organizations: Yes    Attends Engineer, structural: More than 4 times per year    Marital Status: Married  Catering manager Violence: Not on file   Family Status  Relation Name Status   Mother  Alive  Father  Alive   Brother  Alive   Mat Uncle  Deceased   MGM  Deceased   MGF  Deceased       suicide   PGM  Deceased   PGF  Deceased   Son  Alive   Neg Hx  (Not Specified)  No partnership data on file   Family History  Problem Relation Age of Onset   Healthy Mother    Hyperlipidemia Father    Healthy Brother    Lung cancer Maternal Uncle    Healthy Maternal Grandmother    Emphysema Maternal Grandfather    Colon cancer Paternal Grandmother    Lung cancer Paternal Grandfather    Healthy Son    Breast cancer Neg Hx    Allergies  Allergen Reactions   Sulfa Antibiotics Rash    Patient Care Team: Jenisis Harmsen, Monico Blitz, DO as PCP - General (Family Medicine) Carmina Miller, MD as Consulting Physician (Radiation Oncology) Creig Hines, MD as Consulting Physician (Oncology) Carolan Shiver, MD as Consulting Physician (General Surgery)   Medications: Outpatient Medications Prior to Visit  Medication Sig   acetaminophen (TYLENOL) 500 MG tablet Take 1 tablet (500 mg total) by mouth every 6 (six) hours as needed (pain).   anastrozole (ARIMIDEX) 1 MG tablet Take 1 tablet (1 mg total) by mouth daily.   Calcium Carb-Cholecalciferol (CALCIUM 600  + D PO) Take 1 tablet by mouth daily.   HYDROcodone-acetaminophen (NORCO/VICODIN) 5-325 MG tablet Take by mouth.   ibuprofen (ADVIL) 600 MG tablet Take 1 tablet (600 mg total) by mouth every 6 (six) hours as needed.   KRILL OIL PO Take 1,000 mg by mouth daily.   LORazepam (ATIVAN) 0.5 MG tablet TAKE 1 TABLET(0.5 MG) BY MOUTH TWICE DAILY AS NEEDED FOR ANXIETY (Patient taking differently: Take 0.5 mg by mouth 2 (two) times daily as needed for anxiety.)   magnesium 30 MG tablet Take 30 mg by mouth 2 (two) times daily.   meloxicam (MOBIC) 15 MG tablet Take 15 mg by mouth daily as needed for pain.   oxyCODONE (OXY IR/ROXICODONE) 5 MG immediate release tablet Take 1 tablet (5 mg total) by mouth every 4 (four) hours as needed for severe pain.   [DISCONTINUED] valsartan (DIOVAN) 80 MG tablet TAKE 1 TABLET(80 MG) BY MOUTH DAILY   Facility-Administered Medications Prior to Visit  Medication Dose Route Frequency Provider   goserelin (ZOLADEX) injection 10.8 mg  10.8 mg Subcutaneous Q90 days Creig Hines, MD   leuprolide (LUPRON) injection 11.25 mg  11.25 mg Intramuscular Q90 days Creig Hines, MD    Review of Systems  Constitutional:  Negative for appetite change, chills, fatigue and fever.  HENT:  Negative for congestion, ear pain and sore throat.   Eyes:  Negative for visual disturbance.  Respiratory:  Negative for chest tightness and shortness of breath.   Cardiovascular:  Negative for chest pain and palpitations.  Gastrointestinal:  Negative for abdominal pain, nausea and vomiting.  Neurological:  Negative for dizziness, weakness, light-headedness and numbness.      Objective    BP (!) 149/85 (BP Location: Right Arm, Patient Position: Sitting, Cuff Size: Normal)   Pulse 70   Temp 99.1 F (37.3 C) (Oral)   Ht 5\' 9"  (1.753 m)   Wt 169 lb (76.7 kg)   PF 100 L/min   BMI 24.96 kg/m    Physical Exam Vitals and nursing note reviewed.  Constitutional:      General: She is awake.  Appearance: Normal appearance.  HENT:     Head: Normocephalic and atraumatic.     Right Ear: Tympanic membrane, ear canal and external ear normal.     Left Ear: Tympanic membrane, ear canal and external ear normal.     Nose: Nose normal.     Mouth/Throat:     Mouth: Mucous membranes are moist.     Pharynx: Oropharynx is clear. No oropharyngeal exudate or posterior oropharyngeal erythema.  Eyes:     General: No scleral icterus.    Extraocular Movements: Extraocular movements intact.     Conjunctiva/sclera: Conjunctivae normal.     Pupils: Pupils are equal, round, and reactive to light.  Neck:     Thyroid: No thyromegaly or thyroid tenderness.  Cardiovascular:     Rate and Rhythm: Normal rate and regular rhythm.     Pulses: Normal pulses.     Heart sounds: Normal heart sounds.  Pulmonary:     Effort: Pulmonary effort is normal. No tachypnea, bradypnea or respiratory distress.     Breath sounds: Normal breath sounds. No stridor. No wheezing, rhonchi or rales.  Abdominal:     General: Bowel sounds are normal. There is no distension.     Palpations: Abdomen is soft. There is no mass.     Tenderness: There is no abdominal tenderness. There is no guarding.     Hernia: No hernia is present.  Musculoskeletal:     Cervical back: Normal range of motion and neck supple.     Right lower leg: No edema.     Left lower leg: No edema.  Lymphadenopathy:     Cervical: No cervical adenopathy.  Skin:    General: Skin is warm and dry.  Neurological:     Mental Status: She is alert and oriented to person, place, and time. Mental status is at baseline.  Psychiatric:        Mood and Affect: Mood normal.        Behavior: Behavior normal.      Last depression screening scores    06/06/2023    3:14 PM 04/06/2022    9:21 AM 03/25/2021    3:09 PM  PHQ 2/9 Scores  PHQ - 2 Score 0 0 0  PHQ- 9 Score 3 0 3   Last fall risk screening    06/06/2023    3:13 PM  Fall Risk   Falls in the past year? 0   Number falls in past yr: 0  Injury with Fall? 0   Last Audit-C alcohol use screening    06/06/2023    3:14 PM  Alcohol Use Disorder Test (AUDIT)  1. How often do you have a drink containing alcohol? 3  2. How many drinks containing alcohol do you have on a typical day when you are drinking? 0  3. How often do you have six or more drinks on one occasion? 1  AUDIT-C Score 4   A score of 3 or more in women, and 4 or more in men indicates increased risk for alcohol abuse, EXCEPT if all of the points are from question 1   Results for orders placed or performed in visit on 06/06/23  Microalbumin / creatinine urine ratio  Result Value Ref Range   Creatinine, Urine WILL FOLLOW    Microalbumin, Urine WILL FOLLOW    Microalb/Creat Ratio WILL FOLLOW   Comprehensive metabolic panel  Result Value Ref Range   Glucose 99 70 - 99 mg/dL   BUN 13 6 -  24 mg/dL   Creatinine, Ser 4.09 (H) 0.57 - 1.00 mg/dL   eGFR 67 >81 XB/JYN/8.29   BUN/Creatinine Ratio 13 9 - 23   Sodium 143 134 - 144 mmol/L   Potassium 4.5 3.5 - 5.2 mmol/L   Chloride 103 96 - 106 mmol/L   CO2 25 20 - 29 mmol/L   Calcium 10.1 8.7 - 10.2 mg/dL   Total Protein 6.9 6.0 - 8.5 g/dL   Albumin 4.5 3.9 - 4.9 g/dL   Globulin, Total 2.4 1.5 - 4.5 g/dL   Bilirubin Total 0.5 0.0 - 1.2 mg/dL   Alkaline Phosphatase 132 (H) 44 - 121 IU/L   AST 29 0 - 40 IU/L   ALT 43 (H) 0 - 32 IU/L  Lipid panel  Result Value Ref Range   Cholesterol, Total 172 100 - 199 mg/dL   Triglycerides 562 0 - 149 mg/dL   HDL 56 >13 mg/dL   VLDL Cholesterol Cal 20 5 - 40 mg/dL   LDL Chol Calc (NIH) 96 0 - 99 mg/dL   Chol/HDL Ratio 3.1 0.0 - 4.4 ratio  VITAMIN D 25 Hydroxy (Vit-D Deficiency, Fractures)  Result Value Ref Range   Vit D, 25-Hydroxy 35.7 30.0 - 100.0 ng/mL    Assessment & Plan    Routine Health Maintenance and Physical Exam  Exercise Activities and Dietary recommendations  Goals   None     Immunization History  Administered Date(s)  Administered   Tdap 10/28/2010, 04/06/2022    Health Maintenance  Topic Date Due   COVID-19 Vaccine (1) 06/22/2023 (Originally 02/08/1981)   INFLUENZA VACCINE  07/03/2023 (Originally 11/03/2022)   Pneumococcal Vaccine 32-94 Years old (1 of 2 - PCV) 06/05/2024 (Originally [birth year]1980)   MAMMOGRAM  01/12/2024   Cervical Cancer Screening (HPV/Pap Cotest)  11/04/2025   Colonoscopy  11/19/2030   DTaP/Tdap/Td (3 - Td or Tdap) 04/06/2032   Hepatitis C Screening  Completed   HIV Screening  Completed   HPV VACCINES  Aged Out    Discussed health benefits of physical activity, and encouraged her to engage in regular exercise appropriate for her age and condition.   Annual physical exam Assessment & Plan: Physical exam overall unremarkable except as noted above.   No history of elevated blood sugars. Regular blood work needed to monitor overall health and effects of cancer treatment. - Order fasting blood work   Malignant neoplasm of breast, stage 1, estrogen receptor positive, left (HCC) Assessment & Plan: Diagnosed in 2022. Currently on anastrozole and leuprolide injections. Previous treatments included gosarelin (Zoladex). Reports muscle spasms in the left breast area, likely related to surgery and/or medication side effects. Musculoskeletal pain is a known side effect of anastrozole. - Continue anastrozole and leuprolide - Continue magnesium supplementation for musculoskeletal pain - Continue to follow with oncology, Dr. Smith Robert; defer to specialist management   Chronic kidney disease, stage 2, mildly decreased GFR Assessment & Plan: Noted.  No acute concerns.  Continue to monitor.  Orders: -     Microalbumin / creatinine urine ratio -     Lipid panel -     VITAMIN D 25 Hydroxy (Vit-D Deficiency, Fractures)  Secondary hypertension Assessment & Plan: Likely secondary to breast cancer treatments (leuprolide, gosarelin, anastrozole). Home blood pressures range from 140s to 150s/70s-90s. No  associated symptoms. Elevated since starting cancer treatments in 2022. Currently on Diovan (valsartan) 80 mg, previously 40 mg. Discussed risks of untreated hypertension and benefits of increasing antihypertensive medication. Prefers anastrozole due to lower toxicity compared  to tamoxifen. - Increase Diovan (valsartan) dosage - Validate home blood pressure cuff at next visit - Follow up in 4-6 weeks to assess blood pressure control  Orders: -     Valsartan; Take 1 tablet (160 mg total) by mouth daily.  Dispense: 90 tablet; Refill: 3 -     Comprehensive metabolic panel   Follow-up - Bring home blood pressure cuff to next visit for validation. Return in about 6 weeks (around 07/18/2023) for HTN.     I discussed the assessment and treatment plan with the patient  The patient was provided an opportunity to ask questions and all were answered. The patient agreed with the plan and demonstrated an understanding of the instructions.   The patient was advised to call back or seek an in-person evaluation if the symptoms worsen or if the condition fails to improve as anticipated.    Sherlyn Hay, DO  [facility] [phone] (phone) [phone] (fax)  [facility]

## 2023-06-12 ENCOUNTER — Inpatient Hospital Stay: Payer: BC Managed Care – PPO | Attending: Oncology

## 2023-06-12 DIAGNOSIS — Z1732 Human epidermal growth factor receptor 2 negative status: Secondary | ICD-10-CM | POA: Diagnosis not present

## 2023-06-12 DIAGNOSIS — Z1721 Progesterone receptor positive status: Secondary | ICD-10-CM | POA: Diagnosis not present

## 2023-06-12 DIAGNOSIS — C50912 Malignant neoplasm of unspecified site of left female breast: Secondary | ICD-10-CM | POA: Diagnosis not present

## 2023-06-12 DIAGNOSIS — Z170 Estrogen receptor positive status [ER+]: Secondary | ICD-10-CM | POA: Diagnosis not present

## 2023-06-12 MED ORDER — LEUPROLIDE ACETATE 3.75 MG IM KIT
11.2500 mg | PACK | INTRAMUSCULAR | Status: DC
  Administered 2023-06-12: 11.25 mg via INTRAMUSCULAR
  Filled 2023-06-12: qty 11.25

## 2023-06-13 DIAGNOSIS — N182 Chronic kidney disease, stage 2 (mild): Secondary | ICD-10-CM | POA: Diagnosis not present

## 2023-06-13 DIAGNOSIS — I159 Secondary hypertension, unspecified: Secondary | ICD-10-CM | POA: Diagnosis not present

## 2023-06-15 ENCOUNTER — Encounter: Payer: Self-pay | Admitting: Family Medicine

## 2023-06-15 LAB — LIPID PANEL
Chol/HDL Ratio: 3.1 ratio (ref 0.0–4.4)
Cholesterol, Total: 172 mg/dL (ref 100–199)
HDL: 56 mg/dL (ref >39)
LDL Chol Calc (NIH): 96 mg/dL (ref 0–99)
Triglycerides: 113 mg/dL (ref 0–149)
VLDL Cholesterol Cal: 20 mg/dL (ref 5–40)

## 2023-06-15 LAB — MICROALBUMIN / CREATININE URINE RATIO
Creatinine, Urine: 274.6 mg/dL
Microalb/Creat Ratio: 8 mg/g{creat} (ref 0–29)
Microalbumin, Urine: 22.4 ug/mL

## 2023-06-15 LAB — COMPREHENSIVE METABOLIC PANEL
ALT: 43 IU/L — ABNORMAL HIGH (ref 0–32)
AST: 29 IU/L (ref 0–40)
Albumin: 4.5 g/dL (ref 3.9–4.9)
Alkaline Phosphatase: 132 IU/L — ABNORMAL HIGH (ref 44–121)
BUN/Creatinine Ratio: 13 (ref 9–23)
BUN: 13 mg/dL (ref 6–24)
Bilirubin Total: 0.5 mg/dL (ref 0.0–1.2)
CO2: 25 mmol/L (ref 20–29)
Calcium: 10.1 mg/dL (ref 8.7–10.2)
Chloride: 103 mmol/L (ref 96–106)
Creatinine, Ser: 1.02 mg/dL — ABNORMAL HIGH (ref 0.57–1.00)
Globulin, Total: 2.4 g/dL (ref 1.5–4.5)
Glucose: 99 mg/dL (ref 70–99)
Potassium: 4.5 mmol/L (ref 3.5–5.2)
Sodium: 143 mmol/L (ref 134–144)
Total Protein: 6.9 g/dL (ref 6.0–8.5)
eGFR: 67 mL/min/{1.73_m2} (ref >59)

## 2023-06-15 LAB — VITAMIN D 25 HYDROXY (VIT D DEFICIENCY, FRACTURES): Vit D, 25-Hydroxy: 35.7 ng/mL (ref 30.0–100.0)

## 2023-06-15 NOTE — Assessment & Plan Note (Signed)
 Noted.  No acute concerns.  Continue to monitor.

## 2023-06-15 NOTE — Assessment & Plan Note (Signed)
 Likely secondary to breast cancer treatments (leuprolide, gosarelin, anastrozole). Home blood pressures range from 140s to 150s/70s-90s. No associated symptoms. Elevated since starting cancer treatments in 2022. Currently on Diovan (valsartan) 80 mg, previously 40 mg. Discussed risks of untreated hypertension and benefits of increasing antihypertensive medication. Prefers anastrozole due to lower toxicity compared to tamoxifen. - Increase Diovan (valsartan) dosage - Validate home blood pressure cuff at next visit - Follow up in 4-6 weeks to assess blood pressure control

## 2023-06-15 NOTE — Assessment & Plan Note (Signed)
 Physical exam overall unremarkable except as noted above.   No history of elevated blood sugars. Regular blood work needed to monitor overall health and effects of cancer treatment. - Order fasting blood work

## 2023-06-27 ENCOUNTER — Encounter: Payer: Self-pay | Admitting: Obstetrics and Gynecology

## 2023-06-27 NOTE — Progress Notes (Unsigned)
 PCP: Sherlyn Hay, DO   No chief complaint on file.   HPI:      [name] is a 48 y.o. G1P1001 whose LMP was No LMP recorded. (Menstrual status: Other)., presents today for her annual examination.  Her menses are {norm/abn:715}, lasting {number: 22536} days.  Dysmenorrhea {dysmen:716}. She {does:18564} have intermenstrual bleeding. Hx of uterine prolapse, referred to urogyn 2023 She {does:18564} have vasomotor sx.   Sex activity: {sex active: 315163}. She {does:18564} have vaginal dryness.  Last Pap: 11/04/20 Results were: no abnormalities /neg HPV DNA.  Hx of STDs: {STD hx:14358}  Last mammogram: 01/12/23 Results were: normal--routine follow-up in 12 months. Hx of Malignant neoplasm of breast, stage 1, estrogen receptor positive, left (HCC) 6/22 with partial mastectomy; on arimidex currently There is no FH of breast cancer. There is no FH of ovarian cancer. The patient {does:18564} do self-breast exams.  Colonoscopy: 8/22 with [facility];  Repeat due after 10*** years.  DEXA: 12/24 with oncology at [facility] was WNL  Tobacco use: {tob:20664} Alcohol use: {Alcohol:11675} No drug use Exercise: {exercise:31265}  She {does:18564} get adequate calcium and Vitamin D in her diet.  Labs with PCP.   Patient Active Problem List   Diagnosis Date Noted   Chronic kidney disease, stage 2, mildly decreased GFR 06/06/2023   Secondary hypertension 06/06/2023   Elevated blood pressure reading in office without diagnosis of hypertension 04/06/2022   Encounter for hepatitis C screening test for low risk patient 03/25/2021   Annual physical exam 03/25/2021   Stress and adjustment reaction 03/25/2021   Malignant neoplasm of breast, stage 1, estrogen receptor positive, left (HCC) 01/22/2021    Past Surgical History:  Procedure Laterality Date   COLONOSCOPY N/A 11/18/2020   Procedure: COLONOSCOPY;  Surgeon: Pasty Spillers, MD;  Location: [facility];  Service: Endoscopy;   Laterality: N/A;   CYSTOSCOPY N/A 07/18/2022   Procedure: CYSTOSCOPY;  Surgeon: Marguerita Beards, MD;  Location: [facility];  Service: Gynecology;  Laterality: N/A;   FOOT SURGERY Right 07/04/2010   great toe   PART MASTECTOMY,RADIO FREQUENCY LOCALIZER,AXILLARY SENTINEL NODE BIOPSY Left 01/25/2021   Procedure: PART MASTECTOMY,RADIO FREQUENCY LOCALIZER,AXILLARY SENTINEL NODE BIOPSY;  Surgeon: Carolan Shiver, MD;  Location: [facility];  Service: General;  Laterality: Left;   URETHRAL CYST REMOVAL N/A 07/18/2022   Procedure: EXCISION SUBURETHRAL CYST;  Surgeon: Marguerita Beards, MD;  Location: [facility];  Service: Gynecology;  Laterality: N/A;    Family History  Problem Relation Age of Onset   Healthy Mother    Hyperlipidemia Father    Healthy Brother    Lung cancer Maternal Uncle    Healthy Maternal Grandmother    Emphysema Maternal Grandfather    Colon cancer Paternal Grandmother    Lung cancer Paternal Grandfather    Healthy Son    Breast cancer Neg Hx     Social History   Socioeconomic History   Marital status: Married    Spouse name: Not on file   Number of children: Not on file   Years of education: Not on file   Highest education level: Not on file  Occupational History   Not on file  Tobacco Use   Smoking status: Former    Current packs/day: 0.00    Average packs/day: 0.5 packs/day for 10.0 years (5.0 ttl pk-yrs)    Types: Cigarettes    Start date: 37    Quit date: 2005    Years since quitting: 20.2  Smokeless tobacco: Never  Vaping Use   Vaping status: Never Used  Substance and Sexual Activity   Alcohol use: Yes    Comment: occasional   Drug use: Not Currently    Comment: 07-12-2022  per pt last used approx 20003   Sexual activity: Yes    Birth control/protection: None  Other Topics Concern   Not on file  Social History Narrative   Not on file   Social Drivers of Health   Financial Resource Strain: Not  on file  Food Insecurity: Unknown (06/06/2023)   Hunger Vital Sign    Worried About Running Out of Food in the Last Year: Never true    Ran Out of Food in the Last Year: Not on file  Transportation Needs: No Transportation Needs (06/06/2023)   PRAPARE - Administrator, Civil Service (Medical): No    Lack of Transportation (Non-Medical): No  Physical Activity: Sufficiently Active (06/06/2023)   Exercise Vital Sign    Days of Exercise per Week: 6 days    Minutes of Exercise per Session: 70 min  Stress: No Stress Concern Present (06/06/2023)   [employer] of Occupational Health - Occupational Stress Questionnaire    Feeling of Stress : Not at all  Social Connections: Socially Integrated (06/06/2023)   Social Connection and Isolation Panel [NHANES]    Frequency of Communication with Friends and Family: More than three times a week    Frequency of Social Gatherings with Friends and Family: More than three times a week    Attends Religious Services: More than 4 times per year    Active Member of [employer] or Organizations: Yes    Attends Engineer, structural: More than 4 times per year    Marital Status: Married  Catering manager Violence: Not on file     Current Outpatient Medications:    acetaminophen (TYLENOL) 500 MG tablet, Take 1 tablet (500 mg total) by mouth every 6 (six) hours as needed (pain)., Disp: 30 tablet, Rfl: 0   anastrozole (ARIMIDEX) 1 MG tablet, Take 1 tablet (1 mg total) by mouth daily., Disp: 90 tablet, Rfl: 2   Calcium Carb-Cholecalciferol (CALCIUM 600 + D PO), Take 1 tablet by mouth daily., Disp: , Rfl:    HYDROcodone-acetaminophen (NORCO/VICODIN) 5-325 MG tablet, Take by mouth., Disp: , Rfl:    ibuprofen (ADVIL) 600 MG tablet, Take 1 tablet (600 mg total) by mouth every 6 (six) hours as needed., Disp: 30 tablet, Rfl: 0   KRILL OIL PO, Take 1,000 mg by mouth daily., Disp: , Rfl:    LORazepam (ATIVAN) 0.5 MG tablet, TAKE 1 TABLET(0.5 MG) BY MOUTH TWICE  DAILY AS NEEDED FOR ANXIETY (Patient taking differently: Take 0.5 mg by mouth 2 (two) times daily as needed for anxiety.), Disp: 30 tablet, Rfl: 0   magnesium 30 MG tablet, Take 30 mg by mouth 2 (two) times daily., Disp: , Rfl:    oxyCODONE (OXY IR/ROXICODONE) 5 MG immediate release tablet, Take 1 tablet (5 mg total) by mouth every 4 (four) hours as needed for severe pain., Disp: 5 tablet, Rfl: 0   valsartan (DIOVAN) 160 MG tablet, Take 1 tablet (160 mg total) by mouth daily., Disp: 90 tablet, Rfl: 3 No current facility-administered medications for this visit.  Facility-Administered Medications Ordered in Other Visits:    goserelin (ZOLADEX) injection 10.8 mg, 10.8 mg, Subcutaneous, Q90 days, Creig Hines, MD, 10.8 mg at 03/03/22 1146   leuprolide (LUPRON) injection 11.25 mg, 11.25 mg, Intramuscular,  Q90 days, Creig Hines, MD, 11.25 mg at 08/30/22 1456     ROS:  Review of Systems BREAST: No symptoms    Objective: There were no vitals taken for this visit.   OBGyn Exam  Results: No results found for this or any previous visit (from the past 24 hours).  Assessment/Plan:  No diagnosis found.   No orders of the defined types were placed in this encounter.           GYN counsel {counseling: 16159}    F/U  No follow-ups on file.  Vollie Brunty B. Jamail Cullers, PA-C 06/27/2023 12:18 PM

## 2023-06-29 ENCOUNTER — Encounter: Payer: Self-pay | Admitting: Obstetrics and Gynecology

## 2023-06-29 ENCOUNTER — Ambulatory Visit (INDEPENDENT_AMBULATORY_CARE_PROVIDER_SITE_OTHER): Payer: BC Managed Care – PPO | Admitting: Obstetrics and Gynecology

## 2023-06-29 VITALS — BP 132/84 | Ht 69.0 in | Wt 168.0 lb

## 2023-06-29 DIAGNOSIS — C50912 Malignant neoplasm of unspecified site of left female breast: Secondary | ICD-10-CM

## 2023-06-29 DIAGNOSIS — Z01419 Encounter for gynecological examination (general) (routine) without abnormal findings: Secondary | ICD-10-CM

## 2023-06-29 DIAGNOSIS — Z1231 Encounter for screening mammogram for malignant neoplasm of breast: Secondary | ICD-10-CM

## 2023-06-29 DIAGNOSIS — Z1211 Encounter for screening for malignant neoplasm of colon: Secondary | ICD-10-CM

## 2023-06-29 DIAGNOSIS — N819 Female genital prolapse, unspecified: Secondary | ICD-10-CM

## 2023-06-29 NOTE — Patient Instructions (Signed)
 I value your feedback and you entrusting [place] with your care. If you get a [place] patient survey, I would appreciate you taking the time to let us know about your experience today. Thank you! ? ? ?

## 2023-09-11 ENCOUNTER — Inpatient Hospital Stay: Payer: BC Managed Care – PPO

## 2023-09-11 ENCOUNTER — Inpatient Hospital Stay (HOSPITAL_BASED_OUTPATIENT_CLINIC_OR_DEPARTMENT_OTHER): Payer: BC Managed Care – PPO | Admitting: Oncology

## 2023-09-11 ENCOUNTER — Encounter: Payer: Self-pay | Admitting: Oncology

## 2023-09-11 ENCOUNTER — Inpatient Hospital Stay: Payer: BC Managed Care – PPO | Attending: Oncology

## 2023-09-11 DIAGNOSIS — Z5181 Encounter for therapeutic drug level monitoring: Secondary | ICD-10-CM

## 2023-09-11 DIAGNOSIS — E2839 Other primary ovarian failure: Secondary | ICD-10-CM

## 2023-09-11 DIAGNOSIS — Z79811 Long term (current) use of aromatase inhibitors: Secondary | ICD-10-CM | POA: Insufficient documentation

## 2023-09-11 DIAGNOSIS — Z170 Estrogen receptor positive status [ER+]: Secondary | ICD-10-CM | POA: Insufficient documentation

## 2023-09-11 DIAGNOSIS — Z1721 Progesterone receptor positive status: Secondary | ICD-10-CM | POA: Diagnosis not present

## 2023-09-11 DIAGNOSIS — Z08 Encounter for follow-up examination after completed treatment for malignant neoplasm: Secondary | ICD-10-CM

## 2023-09-11 DIAGNOSIS — Z1732 Human epidermal growth factor receptor 2 negative status: Secondary | ICD-10-CM | POA: Insufficient documentation

## 2023-09-11 DIAGNOSIS — C50912 Malignant neoplasm of unspecified site of left female breast: Secondary | ICD-10-CM

## 2023-09-11 DIAGNOSIS — Z87891 Personal history of nicotine dependence: Secondary | ICD-10-CM | POA: Insufficient documentation

## 2023-09-11 LAB — CBC WITH DIFFERENTIAL (CANCER CENTER ONLY)
Abs Immature Granulocytes: 0.02 K/uL (ref 0.00–0.07)
Basophils Absolute: 0.1 K/uL (ref 0.0–0.1)
Basophils Relative: 1 %
Eosinophils Absolute: 0.1 K/uL (ref 0.0–0.5)
Eosinophils Relative: 2 %
HCT: 39.3 % (ref 36.0–46.0)
Hemoglobin: 12.5 g/dL (ref 12.0–15.0)
Immature Granulocytes: 0 %
Lymphocytes Relative: 22 %
Lymphs Abs: 1.5 K/uL (ref 0.7–4.0)
MCH: 29.4 pg (ref 26.0–34.0)
MCHC: 31.8 g/dL (ref 30.0–36.0)
MCV: 92.5 fL (ref 80.0–100.0)
Monocytes Absolute: 0.6 K/uL (ref 0.1–1.0)
Monocytes Relative: 9 %
Neutro Abs: 4.4 K/uL (ref 1.7–7.7)
Neutrophils Relative %: 66 %
Platelet Count: 441 K/uL — ABNORMAL HIGH (ref 150–400)
RBC: 4.25 MIL/uL (ref 3.87–5.11)
RDW: 12.7 % (ref 11.5–15.5)
WBC Count: 6.7 K/uL (ref 4.0–10.5)
nRBC: 0 % (ref 0.0–0.2)

## 2023-09-11 LAB — CMP (CANCER CENTER ONLY)
ALT: 19 U/L (ref 0–44)
AST: 21 U/L (ref 15–41)
Albumin: 4.7 g/dL (ref 3.5–5.0)
Alkaline Phosphatase: 71 U/L (ref 38–126)
Anion gap: 9 (ref 5–15)
BUN: 15 mg/dL (ref 6–20)
CO2: 28 mmol/L (ref 22–32)
Calcium: 9.3 mg/dL (ref 8.9–10.3)
Chloride: 102 mmol/L (ref 98–111)
Creatinine: 1.01 mg/dL — ABNORMAL HIGH (ref 0.44–1.00)
GFR, Estimated: >60 mL/min
Glucose, Bld: 94 mg/dL (ref 70–99)
Potassium: 3.7 mmol/L (ref 3.5–5.1)
Sodium: 139 mmol/L (ref 135–145)
Total Bilirubin: 0.8 mg/dL (ref 0.0–1.2)
Total Protein: 7.6 g/dL (ref 6.5–8.1)

## 2023-09-11 MED ORDER — LEUPROLIDE ACETATE 3.75 MG IM KIT
11.2500 mg | PACK | INTRAMUSCULAR | Status: DC
  Filled 2023-09-11: qty 11.25

## 2023-09-11 MED ORDER — LEUPROLIDE ACETATE (3 MONTH) 11.25 MG IM KIT
11.2500 mg | PACK | Freq: Once | INTRAMUSCULAR | Status: AC
  Administered 2023-09-11: 11.25 mg via INTRAMUSCULAR
  Filled 2023-09-11: qty 11.25

## 2023-09-11 NOTE — Progress Notes (Addendum)
 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  Mammogram showed innumerable cysts in the left breast the largest of which was 1.2 x 1.1 x 1.4 cm.  Targeted ultrasound of the inner left breast showed a hypoechoic mass measuring 1 x 0.8 x 1.9 cm.  No definite lymphadenopathy noted in the left axilla.  No suspicious findings in the right breast.  She has had her last mammogram in 2019 when was again noted to have bilateral breast cysts and subsequent ultrasounds showed benign cysts with no suspicious lesions.Ultrasound-guided biopsy of the left breast mass showed invasive mammary carcinoma 6 mm grade 1 ER 81 to 90% positive PR 91 200% positive and HER2 equivocal by IHC and FISH testing negative   Menarche at the age of 87.  She is G1, P1 L1.  No use of birth control.  No prior hysterectomy.  She is still premenopausal and gets her menstrual cycles regularly.  Family history significant for colon cancer in paternal grandmother and lung cancer or lymphoma in maternal grandfather.   Final lumpectomy pathology showed 13 mm grade 1 invasive mammary carcinoma with negative margins.  3 sentinel lymph nodes negative for malignancy.  No  lymphovascular invasion.  Oncotype score came back at 16And chemotherapy benefit for this score in patients less than 91 years of age would be ~1.6%.  Adjuvant chemotherapy was not recommended.  Patient completed adjuvant radiation treatment.  Patient is on ovarian suppression with Zoladex  plus Arimidex  started in April 2023.    Interval history-she is tolerating Zoladex  and Arimidex  relatively well without any significant side effects.  Her blood pressure has been up ever since she went on aromatase inhibitor and did not make a difference even when we change her medications. she is on valsartan  160 mg daily for her hypertension.  She continues to be active and works out regularly  ECOG PS- 0 Pain scale- 0   Review of systems- Review of Systems  Constitutional:  Negative for chills, fever, malaise/fatigue and weight loss.  HENT:  Negative for congestion, ear discharge and nosebleeds.   Eyes:  Negative for blurred vision.  Respiratory:  Negative for cough, hemoptysis, sputum production, shortness of breath and wheezing.   Cardiovascular:  Negative for chest pain, palpitations, orthopnea and claudication.  Gastrointestinal:  Negative for abdominal pain, blood in stool, constipation, diarrhea, heartburn, melena, nausea and vomiting.  Genitourinary:  Negative for dysuria, flank pain, frequency, hematuria and urgency.  Musculoskeletal:  Negative for back pain, joint pain and myalgias.  Skin:  Negative for rash.  Neurological:  Negative for dizziness, tingling, focal weakness, seizures, weakness and headaches.  Endo/Heme/Allergies:  Does not bruise/bleed easily.  Psychiatric/Behavioral:  Negative for depression and suicidal ideas. The patient does not have insomnia.       Allergies  Allergen Reactions   Sulfa Antibiotics  Rash     Past Medical History:  Diagnosis Date   Family history of colon cancer    Family history of lung cancer    Fibrocystic breast    History of basal cell  carcinoma (BCC) excision 12/06/2016   09/ 2018 Right forearm. Superficial. ;  06/ 2017 left anterior shoulder and left upper back   History of kidney stones    Hypertension    Malignant neoplasm of breast, stage 1, estrogen receptor positive, left (HCC) 09/2020   oncologist--- dr a. Ayodeji Keimig/  radiation oncologist-- dr g. lenn;  dx 06/ 2022  invasive mammary carcinoma, ER/PR +;    09-25-2020  s/p left partial mastectomy w/ node dissection;  completed radiation 04-06-2021   Personal history of radiation therapy    left breast  03-01-2021  to 04-06-2021   Suburethral cyst    SUI (stress urinary incontinence, female)    Umbilical hernia 10/2015     Past Surgical History:  Procedure Laterality Date   COLONOSCOPY N/A 11/18/2020   Procedure: COLONOSCOPY;  Surgeon: Janalyn Keene NOVAK, MD;  Location: [facility];  Service: Endoscopy;  Laterality: N/A;   CYSTOSCOPY N/A 07/18/2022   Procedure: CYSTOSCOPY;  Surgeon: Marilynne Rosaline SAILOR, MD;  Location: [facility];  Service: Gynecology;  Laterality: N/A;   FOOT SURGERY Right 07/04/2010   great toe   PART MASTECTOMY,RADIO FREQUENCY LOCALIZER,AXILLARY SENTINEL NODE BIOPSY Left 01/25/2021   Procedure: PART MASTECTOMY,RADIO FREQUENCY LOCALIZER,AXILLARY SENTINEL NODE BIOPSY;  Surgeon: Rodolph Romano, MD;  Location: [facility];  Service: General;  Laterality: Left;   URETHRAL CYST REMOVAL N/A 07/18/2022   Procedure: EXCISION SUBURETHRAL CYST;  Surgeon: Marilynne Rosaline SAILOR, MD;  Location[facility];  Service: Gynecology;  Laterality: N/A;    Social History   Socioeconomic History   Marital status: Married    Spouse name: Not on file   Number of children: Not on file   Years of education: Not on file   Highest education level: Not on file  Occupational History   Not on file  Tobacco Use   Smoking status: Former    Current packs/day: 0.00    Average packs/day: 0.5 packs/day for 10.0 years (5.0 ttl  pk-yrs)    Types: Cigarettes    Start date: 22    Quit date: 2005    Years since quitting: 20.4   Smokeless tobacco: Never  Vaping Use   Vaping status: Never Used  Substance and Sexual Activity   Alcohol use: Yes    Comment: occasional   Drug use: Not Currently    Comment: 07-12-2022  per pt last used approx 20003   Sexual activity: Yes    Birth control/protection: None  Other Topics Concern   Not on file  Social History Narrative   Not on file   Social Drivers of Health   Financial Resource Strain: Not on file  Food Insecurity: Unknown (06/06/2023)   Hunger Vital Sign    Worried About Running Out of Food in the Last Year: Never true    Ran Out of Food in the Last Year: Not on file  Transportation Needs: No Transportation Needs (06/06/2023)   PRAPARE - Administrator, Civil Service (Medical): No    Lack of Transportation (Non-Medical): No  Physical Activity: Sufficiently Active (06/06/2023)   Exercise Vital Sign    Days of Exercise per Week: 6 days    Minutes of Exercise per Session: 70 min  Stress: No Stress Concern Present (06/06/2023)  [employer] of Occupational Health - Occupational Stress Questionnaire    Feeling of Stress : Not at all  Social Connections: Socially Integrated (06/06/2023)   Social Connection and Isolation Panel [NHANES]    Frequency of Communication with Friends and Family: More than three times a week    Frequency of Social Gatherings with Friends and Family: More than three times a week    Attends Religious Services: More than 4 times per year    Active Member of [employer] or Organizations: Yes    Attends Engineer, structural: More than 4 times per year    Marital Status: Married  Catering manager Violence: Not on file    Family History  Problem Relation Age of Onset   Healthy Mother    Hyperlipidemia Father    Healthy Brother    Lung cancer Maternal Uncle    Healthy Maternal Grandmother    Emphysema Maternal Grandfather     Colon cancer Paternal Grandmother    Lung cancer Paternal Grandfather    Healthy Son    Breast cancer Neg Hx      Current Outpatient Medications:    acetaminophen  (TYLENOL ) 500 MG tablet, Take 1 tablet (500 mg total) by mouth every 6 (six) hours as needed (pain)., Disp: 30 tablet, Rfl: 0   anastrozole  (ARIMIDEX ) 1 MG tablet, Take 1 tablet (1 mg total) by mouth daily., Disp: 90 tablet, Rfl: 2   Calcium Carb-Cholecalciferol (CALCIUM 600 + D PO), Take 1 tablet by mouth daily., Disp: , Rfl:    HYDROcodone -acetaminophen  (NORCO/VICODIN) 5-325 MG tablet, Take by mouth., Disp: , Rfl:    ibuprofen  (ADVIL ) 600 MG tablet, Take 1 tablet (600 mg total) by mouth every 6 (six) hours as needed., Disp: 30 tablet, Rfl: 0   KRILL OIL PO, Take 1,000 mg by mouth daily., Disp: , Rfl:    LORazepam  (ATIVAN ) 0.5 MG tablet, TAKE 1 TABLET(0.5 MG) BY MOUTH TWICE DAILY AS NEEDED FOR ANXIETY (Patient taking differently: Take 0.5 mg by mouth 2 (two) times daily as needed for anxiety.), Disp: 30 tablet, Rfl: 0   magnesium 30 MG tablet, Take 30 mg by mouth 2 (two) times daily., Disp: , Rfl:    oxyCODONE  (OXY IR/ROXICODONE ) 5 MG immediate release tablet, Take 1 tablet (5 mg total) by mouth every 4 (four) hours as needed for severe pain., Disp: 5 tablet, Rfl: 0   valsartan  (DIOVAN ) 160 MG tablet, Take 1 tablet (160 mg total) by mouth daily., Disp: 90 tablet, Rfl: 3 No current facility-administered medications for this visit.  Facility-Administered Medications Ordered in Other Visits:    goserelin (ZOLADEX ) injection 10.8 mg, 10.8 mg, Subcutaneous, Q90 days, Melanee Annah BROCKS, MD, 10.8 mg at 03/03/22 1146  Physical exam:  Vitals:   09/11/23 1401  BP: (!) 148/79  Pulse: 76  Resp: 18  Temp: 98.8 F (37.1 C)  TempSrc: Tympanic  SpO2: 100%  Weight: 168 lb (76.2 kg)  Height: 5' 9 (1.753 m)   Physical Exam Cardiovascular:     Rate and Rhythm: Normal rate and regular rhythm.     Heart sounds: Normal heart sounds.   Pulmonary:     Effort: Pulmonary effort is normal.     Breath sounds: Normal breath sounds.  Skin:    General: Skin is warm and dry.  Neurological:     Mental Status: She is alert and oriented to person, place, and time.    Breast exam was performed in seated and lying down position. Patient is status  post left lumpectomy with a well-healed surgical scar. No evidence of any palpable masses. No evidence of axillary adenopathy. No evidence of any palpable masses or lumps in the right breast. No evidence of right axillary adenopathy   I have personally reviewed labs listed below:    Latest Ref Rng & Units 09/11/2023    1:46 PM  CMP  Glucose 70 - 99 mg/dL 94   BUN 6 - 20 mg/dL 15   Creatinine 9.55 - 1.00 mg/dL 8.98   Sodium 864 - 854 mmol/L 139   Potassium 3.5 - 5.1 mmol/L 3.7   Chloride 98 - 111 mmol/L 102   CO2 22 - 32 mmol/L 28   Calcium 8.9 - 10.3 mg/dL 9.3   Total Protein 6.5 - 8.1 g/dL 7.6   Total Bilirubin 0.0 - 1.2 mg/dL 0.8   Alkaline Phos 38 - 126 U/L 71   AST 15 - 41 U/L 21   ALT 0 - 44 U/L 19       Latest Ref Rng & Units 09/11/2023    1:47 PM  CBC  WBC 4.0 - 10.5 K/uL 6.7   Hemoglobin 12.0 - 15.0 g/dL 87.4   Hematocrit 63.9 - 46.0 % 39.3   Platelets 150 - 400 K/uL 441     Assessment and plan- Patient is a 48 y.o. female  with 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  She is here for routine f/u of breast cancer  Patient is currently on ovarian suppression with Lupron  plus Arimidex  for her breast cancer.  She will receive her Lupron  today and continue every 3 months.  I will see her back in 6 months with labs.  She will continue with Arimidex  at least for 5 years ending in 2028 April but potentially longer given that she was premenopausal at diagnosis.  Her bone density scan in December 2024 was normal.  Clinically patient is doing well with no concerning signs and symptoms of recurrence based on  today's exam.  I will also schedule her mammogram for October 2025   Visit Diagnosis 1. Encounter for follow-up surveillance of breast cancer   2. Malignant neoplasm of breast, stage 1, estrogen receptor positive, left (HCC)   3. Suppression of ovarian secretion   4. Visit for monitoring Arimidex  therapy      Dr. Annah Skene, MD, MPH [facility] [phone] 09/11/2023 4:07 PM

## 2023-09-20 ENCOUNTER — Other Ambulatory Visit: Payer: Self-pay | Admitting: Oncology

## 2023-09-20 DIAGNOSIS — C50912 Malignant neoplasm of unspecified site of left female breast: Secondary | ICD-10-CM

## 2023-09-20 DIAGNOSIS — Z5181 Encounter for therapeutic drug level monitoring: Secondary | ICD-10-CM

## 2023-09-20 DIAGNOSIS — E2839 Other primary ovarian failure: Secondary | ICD-10-CM

## 2023-09-28 ENCOUNTER — Other Ambulatory Visit: Payer: Self-pay | Admitting: Oncology

## 2023-09-28 DIAGNOSIS — C50912 Malignant neoplasm of unspecified site of left female breast: Secondary | ICD-10-CM

## 2023-12-12 ENCOUNTER — Inpatient Hospital Stay

## 2023-12-12 ENCOUNTER — Inpatient Hospital Stay: Attending: Oncology

## 2023-12-12 DIAGNOSIS — Z79811 Long term (current) use of aromatase inhibitors: Secondary | ICD-10-CM | POA: Diagnosis not present

## 2023-12-12 DIAGNOSIS — Z170 Estrogen receptor positive status [ER+]: Secondary | ICD-10-CM | POA: Insufficient documentation

## 2023-12-12 DIAGNOSIS — C50912 Malignant neoplasm of unspecified site of left female breast: Secondary | ICD-10-CM | POA: Diagnosis not present

## 2023-12-12 DIAGNOSIS — Z1721 Progesterone receptor positive status: Secondary | ICD-10-CM | POA: Insufficient documentation

## 2023-12-12 DIAGNOSIS — Z1732 Human epidermal growth factor receptor 2 negative status: Secondary | ICD-10-CM | POA: Insufficient documentation

## 2023-12-12 DIAGNOSIS — Z08 Encounter for follow-up examination after completed treatment for malignant neoplasm: Secondary | ICD-10-CM

## 2023-12-12 LAB — CMP (CANCER CENTER ONLY)
ALT: 17 U/L (ref 0–44)
AST: 19 U/L (ref 15–41)
Albumin: 4.6 g/dL (ref 3.5–5.0)
Alkaline Phosphatase: 69 U/L (ref 38–126)
Anion gap: 11 (ref 5–15)
BUN: 16 mg/dL (ref 6–20)
CO2: 23 mmol/L (ref 22–32)
Calcium: 9.5 mg/dL (ref 8.9–10.3)
Chloride: 102 mmol/L (ref 98–111)
Creatinine: 0.97 mg/dL (ref 0.44–1.00)
GFR, Estimated: >60 mL/min (ref >60)
Glucose, Bld: 115 mg/dL — ABNORMAL HIGH (ref 70–99)
Potassium: 4 mmol/L (ref 3.5–5.1)
Sodium: 136 mmol/L (ref 135–145)
Total Bilirubin: 1 mg/dL (ref 0.0–1.2)
Total Protein: 7.4 g/dL (ref 6.5–8.1)

## 2023-12-12 LAB — CBC WITH DIFFERENTIAL (CANCER CENTER ONLY)
Abs Immature Granulocytes: 0.02 K/uL (ref 0.00–0.07)
Basophils Absolute: 0.1 K/uL (ref 0.0–0.1)
Basophils Relative: 1 %
Eosinophils Absolute: 0.1 K/uL (ref 0.0–0.5)
Eosinophils Relative: 1 %
HCT: 37.7 % (ref 36.0–46.0)
Hemoglobin: 12.4 g/dL (ref 12.0–15.0)
Immature Granulocytes: 0 %
Lymphocytes Relative: 21 %
Lymphs Abs: 1.2 K/uL (ref 0.7–4.0)
MCH: 30.2 pg (ref 26.0–34.0)
MCHC: 32.9 g/dL (ref 30.0–36.0)
MCV: 91.7 fL (ref 80.0–100.0)
Monocytes Absolute: 0.4 K/uL (ref 0.1–1.0)
Monocytes Relative: 7 %
Neutro Abs: 3.8 K/uL (ref 1.7–7.7)
Neutrophils Relative %: 70 %
Platelet Count: 392 K/uL (ref 150–400)
RBC: 4.11 MIL/uL (ref 3.87–5.11)
RDW: 13 % (ref 11.5–15.5)
WBC Count: 5.6 K/uL (ref 4.0–10.5)
nRBC: 0 % (ref 0.0–0.2)

## 2023-12-12 LAB — VITAMIN D 25 HYDROXY (VIT D DEFICIENCY, FRACTURES): Vit D, 25-Hydroxy: 74.44 ng/mL (ref 30–100)

## 2023-12-12 MED ORDER — LEUPROLIDE ACETATE (3 MONTH) 11.25 MG IM KIT
11.2500 mg | PACK | Freq: Once | INTRAMUSCULAR | Status: AC
  Administered 2023-12-12: 11.25 mg via INTRAMUSCULAR
  Filled 2023-12-12: qty 11.25

## 2023-12-12 NOTE — Progress Notes (Signed)
 Confirmed w/ Dr. Melanee pt is on LUPRON .  She will correct her notes that read Zoladex .  Wilma Dollar, Pharm.D., CPP 12/12/2023@1 :16 PM

## 2024-01-16 ENCOUNTER — Ambulatory Visit
Admission: RE | Admit: 2024-01-16 | Discharge: 2024-01-16 | Disposition: A | Discharge status: Home / Self Care | Source: Ambulatory Visit | Attending: Oncology | Admitting: Oncology

## 2024-01-16 DIAGNOSIS — C50912 Malignant neoplasm of unspecified site of left female breast: Secondary | ICD-10-CM | POA: Diagnosis not present

## 2024-01-16 DIAGNOSIS — R928 Other abnormal and inconclusive findings on diagnostic imaging of breast: Secondary | ICD-10-CM | POA: Diagnosis not present

## 2024-01-16 DIAGNOSIS — E2839 Other primary ovarian failure: Secondary | ICD-10-CM | POA: Diagnosis not present

## 2024-01-16 DIAGNOSIS — Z170 Estrogen receptor positive status [ER+]: Secondary | ICD-10-CM | POA: Diagnosis not present

## 2024-01-16 DIAGNOSIS — R92333 Mammographic heterogeneous density, bilateral breasts: Secondary | ICD-10-CM | POA: Diagnosis not present

## 2024-01-16 DIAGNOSIS — Z5181 Encounter for therapeutic drug level monitoring: Secondary | ICD-10-CM | POA: Diagnosis not present

## 2024-01-16 DIAGNOSIS — Z79811 Long term (current) use of aromatase inhibitors: Secondary | ICD-10-CM | POA: Diagnosis not present

## 2024-03-04 ENCOUNTER — Ambulatory Visit: Payer: BC Managed Care – PPO | Admitting: Dermatology

## 2024-03-04 ENCOUNTER — Encounter: Payer: Self-pay | Admitting: Dermatology

## 2024-03-04 DIAGNOSIS — W908XXA Exposure to other nonionizing radiation, initial encounter: Secondary | ICD-10-CM

## 2024-03-04 DIAGNOSIS — Z85828 Personal history of other malignant neoplasm of skin: Secondary | ICD-10-CM

## 2024-03-04 DIAGNOSIS — D2271 Melanocytic nevi of right lower limb, including hip: Secondary | ICD-10-CM

## 2024-03-04 DIAGNOSIS — Z1283 Encounter for screening for malignant neoplasm of skin: Secondary | ICD-10-CM | POA: Diagnosis not present

## 2024-03-04 DIAGNOSIS — D485 Neoplasm of uncertain behavior of skin: Secondary | ICD-10-CM | POA: Diagnosis not present

## 2024-03-04 DIAGNOSIS — D229 Melanocytic nevi, unspecified: Secondary | ICD-10-CM

## 2024-03-04 DIAGNOSIS — L821 Other seborrheic keratosis: Secondary | ICD-10-CM

## 2024-03-04 DIAGNOSIS — L720 Epidermal cyst: Secondary | ICD-10-CM

## 2024-03-04 DIAGNOSIS — D1801 Hemangioma of skin and subcutaneous tissue: Secondary | ICD-10-CM

## 2024-03-04 DIAGNOSIS — L578 Other skin changes due to chronic exposure to nonionizing radiation: Secondary | ICD-10-CM

## 2024-03-04 DIAGNOSIS — D2371 Other benign neoplasm of skin of right lower limb, including hip: Secondary | ICD-10-CM

## 2024-03-04 DIAGNOSIS — D225 Melanocytic nevi of trunk: Secondary | ICD-10-CM

## 2024-03-04 DIAGNOSIS — L814 Other melanin hyperpigmentation: Secondary | ICD-10-CM

## 2024-03-04 NOTE — Progress Notes (Signed)
 Follow-Up Visit   Subjective  Megan Cummings is a 48 y.o. female who presents for the following: Skin Cancer Screening and Full Body Skin Exam  The patient presents for Total-Body Skin Exam (TBSE) for skin cancer screening and mole check. The patient has spots, moles and lesions to be evaluated, some may be new or changing. She has a mole between her toes that she noticed had grown in size when she checked it a month or so ago, present since she was a child.  History of BCCs.   The following portions of the chart were reviewed this encounter and updated as appropriate: medications, allergies, medical history  Review of Systems:  No other skin or systemic complaints except as noted in HPI or Assessment and Plan.  Objective  Well appearing patient in no apparent distress; mood and affect are within normal limits.  A full examination was performed including scalp, head, eyes, ears, nose, lips, neck, chest, axillae, abdomen, back, buttocks, bilateral upper extremities, bilateral lower extremities, hands, feet, fingers, toes, fingernails, and toenails. All findings within normal limits unless otherwise noted below.   Relevant physical exam findings are noted in the Assessment and Plan.  R 3rd webspace at base of 3rd toe 7 mm speckled tan macule   Assessment & Plan   SKIN CANCER SCREENING PERFORMED TODAY.  ACTINIC DAMAGE - Chronic condition, secondary to cumulative UV/sun exposure - diffuse scaly erythematous macules with underlying dyspigmentation - Recommend daily broad spectrum sunscreen SPF 30+ to sun-exposed areas, reapply every 2 hours as needed.  - Staying in the shade or wearing long sleeves, sun glasses (UVA+UVB protection) and wide brim hats (4-inch brim around the entire circumference of the hat) are also recommended for sun protection.  - Call for new or changing lesions.  LENTIGINES, SEBORRHEIC KERATOSES, HEMANGIOMAS - Benign normal skin lesions - Benign-appearing -  Call for any changes Counseling for BBL / IPL / Laser and Coordination of Care for lentigines face Discussed the treatment option of Broad Band Light (BBL) /Intense Pulsed Light (IPL)/ Laser for skin discoloration, including brown spots and redness.  Typically we recommend at least 1-3 treatment sessions about 5-8 weeks apart for best results.  Cannot have tanned skin when BBL performed, and regular use of sunscreen/photoprotection is advised after the procedure to help maintain results. The patient's condition may also require maintenance treatments in the future.  The fee for BBL / laser treatments is $350 per treatment session for the whole face.  A fee can be quoted for other parts of the body.  Insurance typically does not pay for BBL/laser treatments and therefore the fee is an out-of-pocket cost. Recommend prophylactic valtrex treatment. Once scheduled for procedure, will send Rx in prior to patient's appointment.   MELANOCYTIC NEVI - Tan-brown and/or pink-flesh-colored symmetric macules and papules, regular brown macules at the left buttock - 3 x 2 mm medium dark brown macule at left mid back - 3.5 mm brown macules x 2, darker medial macule at right lower thigh - 4 mm medium brown macule at right lateral thigh - 3 x 4 mm medium brown macule with lighter edge at right lower back - Benign appearing on exam today - Observation - Call clinic for new or changing moles - Recommend daily use of broad spectrum spf 30+ sunscreen to sun-exposed areas.   HISTORY OF BASAL CELL CARCINOMA OF THE SKIN 12/2016 Right forearm. Superficial. ; 09/2015 left anterior shoulder and left upper back  - No evidence of  recurrence today - Recommend regular full body skin exams - Recommend daily broad spectrum sunscreen SPF 30+ to sun-exposed areas, reapply every 2 hours as needed.  - Call if any new or changing lesions are noted between office visits  Milia - tiny firm white papules face - type of cyst -  benign - sometimes these will clear with nightly OTC adapalene/Differin 0.1% gel or retinol. - may be extracted if symptomatic - observe  Recommend OTC adapalene 0.1% gel pea sized amount to entire face nightly as tolerated.  This can be used to treat acne (whiteheads, blackheads) and milia (tiny firm white cysts).  It may cause dry irritated skin with initial use, and to minimize this, we recommend applying a light moisturizer to face before applying adapalene and/or applying it less frequently.  OTC brands include Differin 0.1% gel (Galderma), Adapalene 0.1% gel (Neutrogena), and Effaclar gel ( La Roche Posay).  They are found in the acne section in the pharmacy.   NEOPLASM OF UNCERTAIN BEHAVIOR OF SKIN R 3rd webspace at base of 3rd toe Skin / nail biopsy Type of biopsy: tangential   Informed consent: discussed and consent obtained   Patient was prepped and draped in usual sterile fashion: Area prepped with alcohol. Anesthesia: the lesion was anesthetized in a standard fashion   Anesthetic:  1% lidocaine  w/ epinephrine  1-100,000 buffered w/ 8.4% NaHCO3 Instrument used: flexible razor blade   Hemostasis achieved with: pressure, aluminum chloride and electrodesiccation   Outcome: patient tolerated procedure well   Post-procedure details: wound care instructions given   Post-procedure details comment:  Ointment and small bandage applied  Specimen 1 - Surgical pathology Differential Diagnosis: Nevus r/o Atypia Check Margins: No Present since patient was a child, but has grown per patient.  Return in about 1 year (around 03/04/2025) for TBSE. Also next available BBL to the face. SABRA LILLETTE Andrea Ezzard, CMA, am acting as scribe for Rexene Rattler, MD .   Documentation: I have reviewed the above documentation for accuracy and completeness, and I agree with the above.  Rexene Rattler, MD

## 2024-03-04 NOTE — Patient Instructions (Addendum)
 Recommend OTC adapalene 0.1% gel pea sized amount to entire face nightly as tolerated.  This can be used to treat acne (whiteheads, blackheads) and milia (tiny firm white cysts).  It may cause dry irritated skin with initial use, and to minimize this, we recommend applying a light moisturizer to face before applying adapalene and/or applying it less frequently.  OTC brands include Differin 0.1% gel (Galderma), Adapalene 0.1% gel (Neutrogena), and Effaclar gel ( La Roche Posay).  They are found in the acne section in the pharmacy.    Basic OTC daily skin care regimen to prevent photoaging:   Recommend facial moisturizer with sunscreen SPF 30 every morning (OTC brands include CeraVe AM, Neutrogena, Eucerin, Cetaphil, Aveeno, La Roche Posay).  Can also apply a topical Vit C serum which is an antioxidant (OTC brands include CeraVe, La Roche Posay, Neutrogena and The Ordinary) underneath sunscreen in morning. If you are outside during the day in the summer for extended periods, especially swimming and/or sweating, make sure you apply a water  resistant facial sunscreen lotion spf 30 or higher.   At night recommend a cream with retinol (a vitamin A derivative which stimulates collagen production) like CeraVe skin renewing retinol serum or ROC retinol correxion cream or Neutrogena rapid wrinkle repair cream. Retinol may cause skin irritation in people with sensitive skin.  Can use it every other day and/or apply on top of a hyaluronic acid (HA) moisturizer/serum (Neutrogena Hydroboost water  cream) if better tolerated that way.  Retinol may also help with lightening brown spots.   Our office sells high quality, medically tested skin care lines such as Elta MD sunscreens (with Zinc), and Alastin skin care products, which are very effective in treating photoaging. The Alastin line includes cosmeceutical grade Vit.C serum, HA serum, Elastin stimulating moisturizers/serums, lightening serum, and sunscreens.  If you  want prescription treatment, then you would need an appointment (Rx tretinoin and fade creams, Botox, filler injections, laser treatments, etc.) These prescriptions and procedures are not covered by insurance but work very well.  Counseling for BBL / IPL / Laser and Coordination of Care Discussed the treatment option of Broad Band Light (BBL) /Intense Pulsed Light (IPL)/ Laser for skin discoloration, including brown spots and redness.  Typically we recommend at least 1-3 treatment sessions about 5-8 weeks apart for best results.  Cannot have tanned skin when BBL performed, and regular use of sunscreen/photoprotection is advised after the procedure to help maintain results. The patient's condition may also require maintenance treatments in the future.  The fee for BBL / laser treatments is $350 per treatment session for the whole face.  A fee can be quoted for other parts of the body.  Insurance typically does not pay for BBL/laser treatments and therefore the fee is an out-of-pocket cost. Recommend prophylactic valtrex treatment. Once scheduled for procedure, will send Rx in prior to patient's appointment.    Wound Care Instructions  Cleanse wound gently with soap and water  once a day then pat dry with clean gauze. Apply a thin coat of Petrolatum (petroleum jelly, Vaseline) over the wound (unless you have an allergy to this). We recommend that you use a new, sterile tube of Vaseline. Do not pick or remove scabs. Do not remove the yellow or white healing tissue from the base of the wound.  Cover the wound with fresh, clean, nonstick gauze and secure with paper tape. You may use Band-Aids in place of gauze and tape if the wound is small enough, but would recommend  trimming much of the tape off as there is often too much. Sometimes Band-Aids can irritate the skin.  You should call the office for your biopsy report after 1 week if you have not already been contacted.  If you experience any problems,  such as abnormal amounts of bleeding, swelling, significant bruising, significant pain, or evidence of infection, please call the office immediately.  FOR ADULT SURGERY PATIENTS: If you need something for pain relief you may take 1 extra strength Tylenol  (acetaminophen ) AND 2 Ibuprofen  (200mg  each) together every 4 hours as needed for pain. (do not take these if you are allergic to them or if you have a reason you should not take them.) Typically, you may only need pain medication for 1 to 3 days.     Melanoma ABCDEs  Melanoma is the most dangerous type of skin cancer, and is the leading cause of death from skin disease.  You are more likely to develop melanoma if you: Have light-colored skin, light-colored eyes, or red or blond hair Spend a lot of time in the sun Tan regularly, either outdoors or in a tanning bed Have had blistering sunburns, especially during childhood Have a close family member who has had a melanoma Have atypical moles or large birthmarks  Early detection of melanoma is key since treatment is typically straightforward and cure rates are extremely high if we catch it early.   The first sign of melanoma is often a change in a mole or a new dark spot.  The ABCDE system is a way of remembering the signs of melanoma.  A for asymmetry:  The two halves do not match. B for border:  The edges of the growth are irregular. C for color:  A mixture of colors are present instead of an even brown color. D for diameter:  Melanomas are usually (but not always) greater than 6mm - the size of a pencil eraser. E for evolution:  The spot keeps changing in size, shape, and color.  Please check your skin once per month between visits. You can use a small mirror in front and a large mirror behind you to keep an eye on the back side or your body.   If you see any new or changing lesions before your next follow-up, please call to schedule a visit.  Please continue daily skin protection  including broad spectrum sunscreen SPF 30+ to sun-exposed areas, reapplying every 2 hours as needed when you're outdoors.   Staying in the shade or wearing long sleeves, sun glasses (UVA+UVB protection) and wide brim hats (4-inch brim around the entire circumference of the hat) are also recommended for sun protection.     Due to recent changes in healthcare laws, you may see results of your pathology and/or laboratory studies on MyChart before the doctors have had a chance to review them. We understand that in some cases there may be results that are confusing or concerning to you. Please understand that not all results are received at the same time and often the doctors may need to interpret multiple results in order to provide you with the best plan of care or course of treatment. Therefore, we ask that you please give us  2 business days to thoroughly review all your results before contacting the office for clarification. Should we see a critical lab result, you will be contacted sooner.   If You Need Anything After Your Visit  If you have any questions or concerns for your doctor, please call  our main line at [phone] and press option 4 to reach your doctor's medical assistant. If no one answers, please leave a voicemail as directed and we will return your call as soon as possible. Messages left after 4 pm will be answered the following business day.   You may also send us  a message via MyChart. We typically respond to MyChart messages within 1-2 business days.  For prescription refills, please ask your pharmacy to contact our office. Our fax number is [phone].  If you have an urgent issue when the clinic is closed that cannot wait until the next business day, you can page your doctor at the number below.    Please note that while we do our best to be available for urgent issues outside of office hours, we are not available 24/7.   If you have an urgent issue and are unable to reach  us , you may choose to seek medical care at your doctor's office, retail clinic, urgent care center, or emergency room.  If you have a medical emergency, please immediately call 911 or go to the emergency department.  Pager Numbers  - Dr. Hester: [phone]  - Dr. Jackquline: [phone]  - Dr. Claudene: [phone]   - Dr. Raymund: [phone]  In the event of inclement weather, please call our main line at [phone] for an update on the status of any delays or closures.  Dermatology Medication Tips: Please keep the boxes that topical medications come in in order to help keep track of the instructions about where and how to use these. Pharmacies typically print the medication instructions only on the boxes and not directly on the medication tubes.   If your medication is too expensive, please contact our office at [phone] option 4 or send us  a message through MyChart.   We are unable to tell what your co-pay for medications will be in advance as this is different depending on your insurance coverage. However, we may be able to find a substitute medication at lower cost or fill out paperwork to get insurance to cover a needed medication.   If a prior authorization is required to get your medication covered by your insurance company, please allow us  1-2 business days to complete this process.  Drug prices often vary depending on where the prescription is filled and some pharmacies may offer cheaper prices.  The website www.goodrx.com contains coupons for medications through different pharmacies. The prices here do not account for what the cost may be with help from insurance (it may be cheaper with your insurance), but the website can give you the price if you did not use any insurance.  - You can print the associated coupon and take it with your prescription to the pharmacy.  - You may also stop by our office during regular business hours and pick up a GoodRx coupon card.  - If  you need your prescription sent electronically to a different pharmacy, notify our office through [facility] or by phone at [phone] option 4.     Si Usted Necesita Algo Despus de Su Visita  Tambin puede enviarnos un mensaje a travs de Clinical Cytogeneticist. Por lo general respondemos a los mensajes de MyChart en el transcurso de 1 a 2 das hbiles.  Para renovar recetas, por favor pida a su farmacia que se ponga en contacto con nuestra oficina. Randi lakes de fax es [place] [phone].  Si tiene un asunto urgente cuando la clnica est cerrada y que no puede esperar  hasta el siguiente da hbil, puede llamar/localizar a su doctor(a) al nmero que aparece a continuacin.   Por favor, tenga en cuenta que aunque hacemos todo lo posible para estar disponibles para asuntos urgentes fuera del horario de [place], no estamos disponibles las 24 horas del da, los 7 [address]  [address] de la [employer].   Si tiene un problema urgente y no puede comunicarse con nosotros, puede optar por buscar atencin mdica  en el consultorio de su doctor(a), en una clnica privada, en un centro de atencin urgente o en una sala de emergencias.  Si tiene engineer, drilling, por favor llame inmediatamente al 911 o vaya a la sala de emergencias.  Nmeros de bper  - Dr. Hester: [phone]  - Dra. Jackquline: [phone]  - Dr. Claudene: [phone]  - Dra. Kitts: [phone]  En caso de inclemencias del [place], por favor llame a nuestra lnea principal al [phone] para una actualizacin sobre el estado de cualquier retraso o cierre.  Consejos para la medicacin en dermatologa: Por favor, guarde las cajas en las que vienen los medicamentos de uso tpico para ayudarle a seguir las instrucciones sobre dnde y cmo usarlos. Las farmacias generalmente imprimen las instrucciones del medicamento slo en las cajas y no directamente en los tubos del [place].   Si su medicamento es muy caro, por favor, pngase en contacto  con landry rieger llamando al [phone] y presione la opcin 4 o envenos un mensaje a travs de Clinical Cytogeneticist.   No podemos decirle cul ser su copago por los medicamentos por adelantado ya que esto es diferente dependiendo de la cobertura de su seguro. Sin embargo, es posible que podamos encontrar un medicamento sustituto a audiological scientist un formulario para que el seguro cubra el medicamento que se considera necesario.   Si se requiere una autorizacin previa para que su compaa de seguros cubra su medicamento, por favor permtanos de 1 a 2 das hbiles para completar este proceso.  Los precios de los medicamentos varan con frecuencia dependiendo del environmental consultant de dnde se surte la receta y alguna farmacias pueden ofrecer precios ms baratos.  El sitio web www.goodrx.com tiene cupones para medicamentos de health and safety inspector. Los precios aqu no tienen en cuenta lo que podra costar con la ayuda del seguro (puede ser ms barato con su seguro), pero el sitio web puede darle el precio si no utiliz tourist information centre manager.  - Puede imprimir el cupn correspondiente y llevarlo con su receta a la farmacia.  - Tambin puede pasar por nuestra oficina durante el horario de atencin regular y education officer, museum una tarjeta de cupones de GoodRx.  - Si necesita que su receta se enve electrnicamente a una farmacia diferente, informe a nuestra oficina a travs de MyChart de [place] o por telfono llamando al [phone] y presione la opcin 4.

## 2024-03-06 LAB — SURGICAL PATHOLOGY

## 2024-03-11 ENCOUNTER — Ambulatory Visit: Payer: Self-pay | Admitting: Dermatology

## 2024-03-11 ENCOUNTER — Other Ambulatory Visit: Payer: Self-pay

## 2024-03-11 DIAGNOSIS — C50912 Malignant neoplasm of unspecified site of left female breast: Secondary | ICD-10-CM

## 2024-03-11 NOTE — Telephone Encounter (Signed)
Left message for patient to call for biopsy results. 

## 2024-03-11 NOTE — Telephone Encounter (Signed)
-----   Message from Rexene Rattler sent at 03/11/2024  1:29 PM EST ----- 1. Skin, R 3rd webspace at base of 3rd toe :       DYSPLASTIC NEVUS WITH MODERATE TO SEVERE ATYPIA, PERIPHERAL AND DEEP MARGINS       INVOLVED, SEE DESCRIPTION   Mod/severely atypical mole, needs excision, would likely leave it open to heal due to size of mole and location - please call patient ----- Message ----- From: Interface, Lab In Three Zero One Sent: 03/06/2024   4:17 PM EST To: Rexene Rattler, MD

## 2024-03-12 ENCOUNTER — Inpatient Hospital Stay: Admitting: Nurse Practitioner

## 2024-03-12 ENCOUNTER — Inpatient Hospital Stay: Attending: Oncology

## 2024-03-12 ENCOUNTER — Ambulatory Visit

## 2024-03-12 ENCOUNTER — Encounter: Payer: Self-pay | Admitting: Nurse Practitioner

## 2024-03-12 VITALS — BP 143/74 | HR 65 | Temp 98.8°F | Ht 69.0 in | Wt 169.0 lb

## 2024-03-12 DIAGNOSIS — Z1732 Human epidermal growth factor receptor 2 negative status: Secondary | ICD-10-CM | POA: Insufficient documentation

## 2024-03-12 DIAGNOSIS — Z1721 Progesterone receptor positive status: Secondary | ICD-10-CM | POA: Insufficient documentation

## 2024-03-12 DIAGNOSIS — C50912 Malignant neoplasm of unspecified site of left female breast: Secondary | ICD-10-CM

## 2024-03-12 DIAGNOSIS — Z79811 Long term (current) use of aromatase inhibitors: Secondary | ICD-10-CM | POA: Diagnosis not present

## 2024-03-12 DIAGNOSIS — Z87891 Personal history of nicotine dependence: Secondary | ICD-10-CM | POA: Insufficient documentation

## 2024-03-12 DIAGNOSIS — Z170 Estrogen receptor positive status [ER+]: Secondary | ICD-10-CM | POA: Diagnosis not present

## 2024-03-12 DIAGNOSIS — Z801 Family history of malignant neoplasm of trachea, bronchus and lung: Secondary | ICD-10-CM | POA: Insufficient documentation

## 2024-03-12 DIAGNOSIS — Z800 Family history of malignant neoplasm of digestive organs: Secondary | ICD-10-CM | POA: Insufficient documentation

## 2024-03-12 LAB — CBC WITH DIFFERENTIAL (CANCER CENTER ONLY)
Abs Immature Granulocytes: 0.01 K/uL (ref 0.00–0.07)
Basophils Absolute: 0.1 K/uL (ref 0.0–0.1)
Basophils Relative: 1 %
Eosinophils Absolute: 0.1 K/uL (ref 0.0–0.5)
Eosinophils Relative: 2 %
HCT: 39.1 % (ref 36.0–46.0)
Hemoglobin: 12.7 g/dL (ref 12.0–15.0)
Immature Granulocytes: 0 %
Lymphocytes Relative: 19 %
Lymphs Abs: 1.2 K/uL (ref 0.7–4.0)
MCH: 30.2 pg (ref 26.0–34.0)
MCHC: 32.5 g/dL (ref 30.0–36.0)
MCV: 93.1 fL (ref 80.0–100.0)
Monocytes Absolute: 0.5 K/uL (ref 0.1–1.0)
Monocytes Relative: 8 %
Neutro Abs: 4.4 K/uL (ref 1.7–7.7)
Neutrophils Relative %: 70 %
Platelet Count: 458 K/uL — ABNORMAL HIGH (ref 150–400)
RBC: 4.2 MIL/uL (ref 3.87–5.11)
RDW: 12.6 % (ref 11.5–15.5)
WBC Count: 6.3 K/uL (ref 4.0–10.5)
nRBC: 0 % (ref 0.0–0.2)

## 2024-03-12 LAB — CMP (CANCER CENTER ONLY)
ALT: 32 U/L (ref 0–44)
AST: 28 U/L (ref 15–41)
Albumin: 4.6 g/dL (ref 3.5–5.0)
Alkaline Phosphatase: 87 U/L (ref 38–126)
Anion gap: 11 (ref 5–15)
BUN: 14 mg/dL (ref 6–20)
CO2: 28 mmol/L (ref 22–32)
Calcium: 10 mg/dL (ref 8.9–10.3)
Chloride: 104 mmol/L (ref 98–111)
Creatinine: 0.84 mg/dL (ref 0.44–1.00)
GFR, Estimated: >60 mL/min (ref >60)
Glucose, Bld: 114 mg/dL — ABNORMAL HIGH (ref 70–99)
Potassium: 4.6 mmol/L (ref 3.5–5.1)
Sodium: 143 mmol/L (ref 135–145)
Total Bilirubin: 0.4 mg/dL (ref 0.0–1.2)
Total Protein: 7.1 g/dL (ref 6.5–8.1)

## 2024-03-12 LAB — VITAMIN D 25 HYDROXY (VIT D DEFICIENCY, FRACTURES): Vit D, 25-Hydroxy: 81.92 ng/mL (ref 30–100)

## 2024-03-12 MED ORDER — LEUPROLIDE ACETATE (3 MONTH) 11.25 MG IM KIT
11.2500 mg | PACK | Freq: Once | INTRAMUSCULAR | Status: DC
  Filled 2024-03-12: qty 11.25

## 2024-03-12 NOTE — Progress Notes (Signed)
 Per Provider, will hold Lupron  today.

## 2024-03-13 ENCOUNTER — Encounter: Payer: Self-pay | Admitting: Oncology

## 2024-03-13 ENCOUNTER — Encounter: Payer: Self-pay | Admitting: Dermatology

## 2024-03-13 NOTE — Telephone Encounter (Signed)
 Patient was in office with her son and I discussed bx results.  Discussed surgery.  Pt scheduled for surgery.  I sent mychart message with preop form./sh

## 2024-03-13 NOTE — Progress Notes (Signed)
 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  Mammogram showed innumerable cysts in the left breast the largest of which was 1.2 x 1.1 x 1.4 cm.  Targeted ultrasound of the inner left breast showed a hypoechoic mass measuring 1 x 0.8 x 1.9 cm.  No definite lymphadenopathy noted in the left axilla.  No suspicious findings in the right breast.  She has had her last mammogram in 2019 when was again noted to have bilateral breast cysts and subsequent ultrasounds showed benign cysts with no suspicious lesions.Ultrasound-guided biopsy of the left breast mass showed invasive mammary carcinoma 6 mm grade 1 ER 81 to 90% positive PR 91 200% positive and HER2 equivocal by IHC and FISH testing negative   Menarche at the age of 72.  She is G1, P1 L1.  No use of birth control.  No prior hysterectomy.  She is still premenopausal and gets her menstrual cycles regularly.  Family history significant for colon cancer in paternal grandmother and lung cancer or lymphoma in maternal grandfather.   Final lumpectomy pathology showed 13 mm grade 1 invasive mammary carcinoma with negative margins.  3 sentinel lymph nodes negative for malignancy.  No  lymphovascular invasion.  Oncotype score came back at 16And chemotherapy benefit for this score in patients less than 54 years of age would be ~1.6%.  Adjuvant chemotherapy was not recommended.  Patient completed adjuvant radiation treatment.  Patient is on ovarian suppression with Zoladex  plus Arimidex  started in April 2023.    Interval history-she is tolerating lupron  and Arimidex  relatively well without any significant side effects.  Her blood pressure has been up ever since she went on aromatase inhibitor and did not make a difference even when we change her medications. she is on valsartan  160 mg daily for her hypertension.  She continues to be active and works out regularly  ECOG PS- 0 Pain scale- 0   Review of systems- Review of Systems  Constitutional:  Negative for chills, fever, malaise/fatigue and weight loss.  HENT:  Negative for congestion, ear discharge and nosebleeds.   Eyes:  Negative for blurred vision.  Respiratory:  Negative for cough, hemoptysis, sputum production, shortness of breath and wheezing.   Cardiovascular:  Negative for chest pain, palpitations, orthopnea and claudication.  Gastrointestinal:  Negative for abdominal pain, blood in stool, constipation, diarrhea, heartburn, melena, nausea and vomiting.  Genitourinary:  Negative for dysuria, flank pain, frequency, hematuria and urgency.  Musculoskeletal:  Negative for back pain, joint pain and myalgias.  Skin:  Negative for rash.  Neurological:  Negative for dizziness, tingling, focal weakness, seizures, weakness and headaches.  Endo/Heme/Allergies:  Does not bruise/bleed easily.  Psychiatric/Behavioral:  Negative for depression and suicidal ideas. The patient does not have insomnia.       Allergies  Allergen Reactions   Sulfa Antibiotics  Rash     Past Medical History:  Diagnosis Date   Family history of colon cancer    Family history of lung cancer    Fibrocystic breast    History of basal cell carcinoma  (BCC) excision 12/06/2016   09/ 2018 Right forearm. Superficial. ;  06/ 2017 left anterior shoulder and left upper back   History of kidney stones    Hypertension    Malignant neoplasm of breast, stage 1, estrogen receptor positive, left (HCC) 09/2020   oncologist--- dr a. rao/  radiation oncologist-- dr g. lenn;  dx 06/ 2022  invasive mammary carcinoma, ER/PR +;    09-25-2020  s/p left partial mastectomy w/ node dissection;  completed radiation 04-06-2021   Personal history of radiation therapy    left breast  03-01-2021  to 04-06-2021   Suburethral cyst    SUI (stress urinary incontinence, female)    Umbilical hernia 10/2015     Past Surgical History:  Procedure Laterality Date   COLONOSCOPY N/A 11/18/2020   Procedure: COLONOSCOPY;  Surgeon: Janalyn Keene NOVAK, MD;  Location: [facility];  Service: Endoscopy;  Laterality: N/A;   CYSTOSCOPY N/A 07/18/2022   Procedure: CYSTOSCOPY;  Surgeon: Marilynne Rosaline SAILOR, MD;  Location: [facility];  Service: Gynecology;  Laterality: N/A;   FOOT SURGERY Right 07/04/2010   great toe   PART MASTECTOMY,RADIO FREQUENCY LOCALIZER,AXILLARY SENTINEL NODE BIOPSY Left 01/25/2021   Procedure: PART MASTECTOMY,RADIO FREQUENCY LOCALIZER,AXILLARY SENTINEL NODE BIOPSY;  Surgeon: Rodolph Romano, MD;  Location: [facility];  Service: General;  Laterality: Left;   URETHRAL CYST REMOVAL N/A 07/18/2022   Procedure: EXCISION SUBURETHRAL CYST;  Surgeon: Marilynne Rosaline SAILOR, MD;  Location: [facility];  Service: Gynecology;  Laterality: N/A;    Social History   Socioeconomic History   Marital status: Married    Spouse name: Not on file   Number of children: Not on file   Years of education: Not on file   Highest education level: Not on file  Occupational History   Not on file  Tobacco Use   Smoking status: Former    Current packs/day: 0.00    Average packs/day: 0.5 packs/day for 10.0 years (5.0 ttl pk-yrs)     Types: Cigarettes    Start date: 30    Quit date: 2005    Years since quitting: 20.9   Smokeless tobacco: Never  Vaping Use   Vaping status: Never Used  Substance and Sexual Activity   Alcohol use: Yes    Comment: occasional   Drug use: Not Currently    Comment: 07-12-2022  per pt last used approx 20003   Sexual activity: Yes    Birth control/protection: None  Other Topics Concern   Not on file  Social History Narrative   Not on file   Social Drivers of Health   Financial Resource Strain: Not on file  Food Insecurity: Unknown (06/06/2023)   Hunger Vital Sign    Worried About Running Out of Food in the Last Year: Never true    Ran Out of Food in the Last Year: Not on file  Transportation Needs: No Transportation Needs (06/06/2023)   PRAPARE - Administrator, Civil Service (Medical): No    Lack of Transportation (Non-Medical): No  Physical Activity: Sufficiently Active (06/06/2023)   Exercise Vital Sign    Days of Exercise per Week: 6 days    Minutes of Exercise per Session: 70 min  Stress: No Stress Concern Present (06/06/2023)  [employer] of Occupational Health - Occupational Stress Questionnaire    Feeling of Stress : Not at all  Social Connections: Socially Integrated (06/06/2023)   Social Connection and Isolation Panel    Frequency of Communication with Friends and Family: More than three times a week    Frequency of Social Gatherings with Friends and Family: More than three times a week    Attends Religious Services: More than 4 times per year    Active Member of [employer] or Organizations: Yes    Attends Engineer, Structural: More than 4 times per year    Marital Status: Married  Catering Manager Violence: Not on file    Family History  Problem Relation Age of Onset   Healthy Mother    Hyperlipidemia Father    Healthy Brother    Lung cancer Maternal Uncle    Healthy Maternal Grandmother    Emphysema Maternal Grandfather    Colon cancer  Paternal Grandmother    Lung cancer Paternal Grandfather    Healthy Son    Breast cancer Neg Hx      Current Outpatient Medications:    anastrozole  (ARIMIDEX ) 1 MG tablet, TAKE 1 TABLET(1 MG) BY MOUTH DAILY, Disp: 90 tablet, Rfl: 2   LORazepam  (ATIVAN ) 0.5 MG tablet, TAKE 1 TABLET(0.5 MG) BY MOUTH TWICE DAILY AS NEEDED FOR ANXIETY (Patient taking differently: Take 0.5 mg by mouth 2 (two) times daily as needed for anxiety.), Disp: 30 tablet, Rfl: 0   valsartan  (DIOVAN ) 160 MG tablet, Take 1 tablet (160 mg total) by mouth daily., Disp: 90 tablet, Rfl: 3 No current facility-administered medications for this visit.  Facility-Administered Medications Ordered in Other Visits:    goserelin (ZOLADEX ) injection 10.8 mg, 10.8 mg, Subcutaneous, Q90 days, Melanee Annah BROCKS, MD, 10.8 mg at 03/03/22 1146  Physical exam:  Vitals:   03/12/24 1249 03/12/24 1308  BP: (!) 147/80 (!) 143/74  Pulse: 65   Temp: 98.8 F (37.1 C)   TempSrc: Tympanic   SpO2: 100%   Weight: 169 lb (76.7 kg)   Height: 5' 9 (1.753 m)    Physical Exam Cardiovascular:     Rate and Rhythm: Normal rate and regular rhythm.     Heart sounds: Normal heart sounds.  Pulmonary:     Effort: Pulmonary effort is normal.     Breath sounds: Normal breath sounds.  Skin:    General: Skin is warm and dry.  Neurological:     Mental Status: She is alert and oriented to person, place, and time.      I have personally reviewed labs listed below:    Latest Ref Rng & Units 03/12/2024   12:48 PM  CMP  Glucose 70 - 99 mg/dL 885   BUN 6 - 20 mg/dL 14   Creatinine 9.55 - 1.00 mg/dL 9.15   Sodium 864 - 854 mmol/L 143   Potassium 3.5 - 5.1 mmol/L 4.6   Chloride 98 - 111 mmol/L 104   CO2 22 - 32 mmol/L 28   Calcium 8.9 - 10.3 mg/dL 89.9   Total Protein 6.5 - 8.1 g/dL 7.1   Total Bilirubin 0.0 - 1.2 mg/dL 0.4   Alkaline Phos 38 - 126 U/L 87   AST 15 - 41 U/L 28   ALT 0 - 44 U/L 32       Latest Ref Rng & Units 03/12/2024   12:48 PM   CBC  WBC 4.0 - 10.5 K/uL 6.3   Hemoglobin 12.0 - 15.0  g/dL 87.2   Hematocrit 63.9 - 46.0 % 39.1   Platelets 150 - 400 K/uL 458    Narrative & Impression  CLINICAL DATA:  History of LEFT breast lumpectomy and radiation in October 2022. On anastrozole  and Lupron  shots. Due for annual.   EXAM: DIGITAL DIAGNOSTIC BILATERAL MAMMOGRAM WITH TOMOSYNTHESIS AND CAD   TECHNIQUE: Bilateral digital diagnostic mammography and breast tomosynthesis was performed. The images were evaluated with computer-aided detection.   COMPARISON:  Previous exam(s).   ACR Breast Density Category c: The breasts are heterogeneously dense, which may obscure small masses.   FINDINGS: There is density and architectural distortion within the LEFT breast, corresponding to the site of prior lumpectomy. Spot compression magnification view(s) demonstrate no evidence of recurrent malignancy. Findings are stable compared to prior. There is no mammographic evidence of malignancy in EITHER breast.   IMPRESSION: No mammographic evidence of malignancy in EITHER breast status post LEFT breast lumpectomy.   RECOMMENDATION: Diagnostic mammogram of the BILATERAL breasts in 1 year.   Please note that as the patient is now over 2 years out from her lumpectomy, she could return to annual screening mammography in 1 year. Given her history of breast cancer, she remains eligible for annual diagnostic mammography, and has elected to continue with diagnostic mammograms.   I have discussed the findings and recommendations with the patient. If applicable, a reminder letter will be sent to the patient regarding the next appointment.   BI-RADS CATEGORY  2: Benign    Assessment and plan- Patient is a 48 y.o. female  with 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  She is here for routine f/u of breast cancer  Patient is currently on ovarian suppression with  Lupron  plus Arimidex  for her breast cancer.  She is here to receive her lupron  today however inquired about holding off.  She reports ongoing concerns with her blood pressure and continued fatigue, despite exercising and taking blood pressure medications.  This was discussed with Dr. Melanee.  After discussing options with the patient.  The decision was made to hold off on lupron  today.  She will return back to clinic in 3 mths with labs prior to visit that include FSH/LH and estradiol  to determine if she is pre or post menopausal.  If she is post menopausal she will not need to continue with Lupron .  If she is premenopausal options can be discussed at that MD visit.  She is in agreement with this plan.  Dr. Melanee to see her back in 3 months with labs.  She will continue with Arimidex  at least for 5 years ending in 2028 April but potentially longer given that she was premenopausal at diagnosis.  Her bone density scan in December 2024 was normal.  Clinically patient is doing well with no concerning signs and symptoms of recurrence based on today's exam.  Last mammogram in oct of this year with no concerning findings.   Visit Diagnosis 1. Malignant neoplasm of breast, stage 1, estrogen receptor positive, left (HCC)      Morna Husband AGNP-C [facility] at [facility] [phone] 03/13/2024 12:54 PM

## 2024-06-17 ENCOUNTER — Inpatient Hospital Stay

## 2024-06-19 ENCOUNTER — Inpatient Hospital Stay: Admitting: Oncology

## 2024-07-01 ENCOUNTER — Ambulatory Visit: Admitting: Licensed Practical Nurse

## 2024-07-08 ENCOUNTER — Encounter: Admitting: Dermatology

## 2024-07-29 ENCOUNTER — Ambulatory Visit: Admitting: Dermatology

## 2025-03-17 ENCOUNTER — Encounter: Admitting: Dermatology
